# Patient Record
Sex: Female | Born: 1955 | Race: Black or African American | Hispanic: No | Marital: Single | Smoking: Former smoker
Health system: Southern US, Community
[De-identification: ages and names within clinical notes are randomized; demographics above are authoritative.]

## PROBLEM LIST (undated history)

## (undated) DIAGNOSIS — K219 Gastro-esophageal reflux disease without esophagitis: Secondary | ICD-10-CM

## (undated) DIAGNOSIS — R112 Nausea with vomiting, unspecified: Secondary | ICD-10-CM

## (undated) DIAGNOSIS — E119 Type 2 diabetes mellitus without complications: Secondary | ICD-10-CM

## (undated) DIAGNOSIS — Z9484 Stem cells transplant status: Secondary | ICD-10-CM

## (undated) DIAGNOSIS — Z9889 Other specified postprocedural states: Secondary | ICD-10-CM

## (undated) DIAGNOSIS — J40 Bronchitis, not specified as acute or chronic: Secondary | ICD-10-CM

## (undated) DIAGNOSIS — C8590 Non-Hodgkin lymphoma, unspecified, unspecified site: Secondary | ICD-10-CM

## (undated) DIAGNOSIS — M1990 Unspecified osteoarthritis, unspecified site: Secondary | ICD-10-CM

## (undated) DIAGNOSIS — Z8489 Family history of other specified conditions: Secondary | ICD-10-CM

## (undated) HISTORY — PX: EYE SURGERY: SHX253

---

## 1994-08-11 DIAGNOSIS — Z8572 Personal history of non-Hodgkin lymphomas: Secondary | ICD-10-CM | POA: Insufficient documentation

## 1994-08-11 HISTORY — PX: OTHER SURGICAL HISTORY: SHX169

## 1995-08-12 DIAGNOSIS — C8590 Non-Hodgkin lymphoma, unspecified, unspecified site: Secondary | ICD-10-CM

## 1995-08-12 DIAGNOSIS — Z9484 Stem cells transplant status: Secondary | ICD-10-CM

## 1995-08-12 DIAGNOSIS — Z9889 Other specified postprocedural states: Secondary | ICD-10-CM

## 1995-08-12 HISTORY — DX: Stem cells transplant status: Z94.84

## 1995-08-12 HISTORY — DX: Non-Hodgkin lymphoma, unspecified, unspecified site: C85.90

## 1995-08-12 HISTORY — DX: Other specified postprocedural states: Z98.890

## 2013-03-31 ENCOUNTER — Other Ambulatory Visit (HOSPITAL_COMMUNITY): Payer: Self-pay | Admitting: Orthopedic Surgery

## 2013-04-19 ENCOUNTER — Encounter (HOSPITAL_COMMUNITY): Payer: Self-pay | Admitting: Pharmacy Technician

## 2013-04-19 NOTE — Pre-Procedure Instructions (Signed)
Melisse Caetano  04/19/2013   Your procedure is scheduled on:  Wednesday, Sept. 17th   Report to [facility] at  6:30 AM.             Bonita Quin will come in through Entrance "A")   Call this number if you have problems the morning of surgery: [phone]   Remember:   Do not eat food or drink liquids after midnight Tuesday.   Take these medicines the morning of surgery with A SIP OF WATER: None   Do not wear jewelry, make-up or nail polish.  Do not wear lotions, powders, or perfumes. You may NOT wear deodorant.   Do not shave underarms & legs 48 hours prior to surgery.   Do not bring valuables to the hospital.  [facility] is not responsible for any belongings or valuables.  Contacts, dentures or bridgework may not be worn into surgery.   Leave suitcase in the car. After surgery it may be brought to your room.  For patients admitted to the hospital, checkout time is 11:00 AM the day of discharge.   Name and phone number of your driver:    Special Instructions: Shower using CHG 2 nights before surgery and the night before surgery.  If you shower the day of surgery use CHG.  Use special wash - you have one bottle of CHG for all showers.  You should use approximately 1/3 of the bottle for each shower.   Please read over the following fact sheets that you were given: Pain Booklet, Coughing and Deep Breathing and Surgical Site Infection Prevention

## 2013-04-20 ENCOUNTER — Other Ambulatory Visit (HOSPITAL_COMMUNITY): Payer: Self-pay | Admitting: *Deleted

## 2013-04-20 ENCOUNTER — Encounter (HOSPITAL_COMMUNITY): Payer: Self-pay

## 2013-04-20 ENCOUNTER — Encounter (HOSPITAL_COMMUNITY)
Admission: RE | Admit: 2013-04-20 | Discharge: 2013-04-20 | Disposition: A | Discharge status: Home / Self Care | Payer: BC Managed Care – PPO | Source: Ambulatory Visit | Attending: Orthopedic Surgery | Admitting: Orthopedic Surgery

## 2013-04-20 DIAGNOSIS — Z01812 Encounter for preprocedural laboratory examination: Secondary | ICD-10-CM | POA: Insufficient documentation

## 2013-04-20 DIAGNOSIS — Z01810 Encounter for preprocedural cardiovascular examination: Secondary | ICD-10-CM | POA: Insufficient documentation

## 2013-04-20 DIAGNOSIS — Z01818 Encounter for other preprocedural examination: Secondary | ICD-10-CM | POA: Insufficient documentation

## 2013-04-20 HISTORY — DX: Bronchitis, not specified as acute or chronic: J40

## 2013-04-20 HISTORY — DX: Type 2 diabetes mellitus without complications: E11.9

## 2013-04-20 HISTORY — DX: Stem cells transplant status: Z94.84

## 2013-04-20 HISTORY — DX: Unspecified osteoarthritis, unspecified site: M19.90

## 2013-04-20 HISTORY — DX: Non-Hodgkin lymphoma, unspecified, unspecified site: C85.90

## 2013-04-20 HISTORY — DX: Gastro-esophageal reflux disease without esophagitis: K21.9

## 2013-04-20 HISTORY — DX: Other specified postprocedural states: Z98.890

## 2013-04-20 LAB — COMPREHENSIVE METABOLIC PANEL
ALT: 9 U/L (ref 0–35)
AST: 17 U/L (ref 0–37)
CO2: 28 mEq/L (ref 19–32)
Calcium: 9.6 mg/dL (ref 8.4–10.5)
Creatinine, Ser: 0.6 mg/dL (ref 0.50–1.10)
GFR calc Af Amer: >90 mL/min (ref >90)
GFR calc non Af Amer: >90 mL/min (ref >90)
Sodium: 139 mEq/L (ref 135–145)
Total Protein: 7 g/dL (ref 6.0–8.3)

## 2013-04-20 LAB — CBC
MCH: 31.6 pg (ref 26.0–34.0)
MCHC: 33.9 g/dL (ref 30.0–36.0)
MCV: 93.5 fL (ref 78.0–100.0)
Platelets: 261 10*3/uL (ref 150–400)
RBC: 4.14 MIL/uL (ref 3.87–5.11)
RDW: 14.2 % (ref 11.5–15.5)

## 2013-04-20 LAB — PROTIME-INR: INR: 0.93 (ref 0.00–1.49)

## 2013-04-21 NOTE — Progress Notes (Signed)
Anesthesia Chart Review:  Patient is a 57 year old female scheduled for left subtalar fusion and talonavicular fusion on 04/27/13 by Dr. Lajoyce Corners.  History includes smoking, DM on insulin, non-Hodgkin's lymphoma s/p stem cell transplant '97, GERD.  EKG on 04/20/13 showed NSR.  CXR report on 04/20/13 showed: 1. No acute pulmonary process.  2. Abnormal right mediastinal contours/double density. The appearance most resembles a dilated thoracic esophagus such as due to achalasia, but this is not not confirmed (no esophageal fluid level). Query history of achalasia or dysphagia. Other top consideration is highly tortuous thoracic aorta. Less likely other nonspecific mediastinal soft tissue. Chest CT (IV contrast preferred) would evaluate further.   These results will be called to the ordering clinician [Dr. Duda] or representative by the Radiologist Assistant, and communication documented in the PACS Dashboard (Dr. Augusto Gamble).   Preoperative labs noted.  I reviewed CXR report with anesthesiologist Dr. Krista Blue who felt it was okay to defer timing of further evaluation of possible achalasia or highly tortuous thoracic aorta to surgeon.  Velna Ochs [facility]/Anesthesiology Phone [phone] 04/21/2013 1:26 PM

## 2013-04-26 MED ORDER — CEFAZOLIN SODIUM-DEXTROSE 2-3 GM-% IV SOLR
2.0000 g | INTRAVENOUS | Status: AC
  Administered 2013-04-27: 2 g via INTRAVENOUS
  Filled 2013-04-26: qty 50

## 2013-04-27 ENCOUNTER — Encounter (HOSPITAL_COMMUNITY): Payer: Self-pay

## 2013-04-27 ENCOUNTER — Encounter (HOSPITAL_COMMUNITY)
Admission: RE | Disposition: A | Discharge status: Home / Self Care | Payer: Self-pay | Source: Ambulatory Visit | Attending: Orthopedic Surgery

## 2013-04-27 ENCOUNTER — Ambulatory Visit (HOSPITAL_COMMUNITY): Payer: BC Managed Care – PPO | Admitting: Anesthesiology

## 2013-04-27 ENCOUNTER — Observation Stay (HOSPITAL_COMMUNITY)
Admission: RE | Admit: 2013-04-27 | Discharge: 2013-04-29 | Disposition: A | Discharge status: Home / Self Care | Payer: BC Managed Care – PPO | Source: Ambulatory Visit | Attending: Orthopedic Surgery | Admitting: Orthopedic Surgery

## 2013-04-27 ENCOUNTER — Encounter (HOSPITAL_COMMUNITY): Payer: Self-pay | Admitting: Vascular Surgery

## 2013-04-27 DIAGNOSIS — Z794 Long term (current) use of insulin: Secondary | ICD-10-CM | POA: Insufficient documentation

## 2013-04-27 DIAGNOSIS — Z79899 Other long term (current) drug therapy: Secondary | ICD-10-CM | POA: Insufficient documentation

## 2013-04-27 DIAGNOSIS — M76829 Posterior tibial tendinitis, unspecified leg: Secondary | ICD-10-CM

## 2013-04-27 DIAGNOSIS — M19079 Primary osteoarthritis, unspecified ankle and foot: Principal | ICD-10-CM | POA: Insufficient documentation

## 2013-04-27 DIAGNOSIS — C8589 Other specified types of non-Hodgkin lymphoma, extranodal and solid organ sites: Secondary | ICD-10-CM | POA: Insufficient documentation

## 2013-04-27 DIAGNOSIS — Z23 Encounter for immunization: Secondary | ICD-10-CM | POA: Insufficient documentation

## 2013-04-27 DIAGNOSIS — M216X9 Other acquired deformities of unspecified foot: Secondary | ICD-10-CM | POA: Insufficient documentation

## 2013-04-27 DIAGNOSIS — E119 Type 2 diabetes mellitus without complications: Secondary | ICD-10-CM | POA: Insufficient documentation

## 2013-04-27 HISTORY — PX: ANKLE FUSION: SHX5718

## 2013-04-27 LAB — GLUCOSE, CAPILLARY
Glucose-Capillary: 94 mg/dL (ref 70–99)
Glucose-Capillary: 108 mg/dL — ABNORMAL HIGH (ref 70–99)

## 2013-04-27 SURGERY — ANKLE FUSION
Anesthesia: General | Site: Foot | Laterality: Left | Wound class: Clean

## 2013-04-27 MED ORDER — HYDROMORPHONE HCL PF 1 MG/ML IJ SOLN
INTRAMUSCULAR | Status: AC
  Administered 2013-04-27: 0.5 mg
  Filled 2013-04-27: qty 1

## 2013-04-27 MED ORDER — LACTATED RINGERS IV SOLN
INTRAVENOUS | Status: DC | PRN
  Administered 2013-04-27 (×2): via INTRAVENOUS

## 2013-04-27 MED ORDER — PHENYLEPHRINE HCL 10 MG/ML IJ SOLN
INTRAMUSCULAR | Status: DC | PRN
  Administered 2013-04-27 (×2): 80 ug via INTRAVENOUS

## 2013-04-27 MED ORDER — INSULIN ASPART 100 UNIT/ML ~~LOC~~ SOLN
0.0000 [IU] | Freq: Three times a day (TID) | SUBCUTANEOUS | Status: DC
Start: 2013-04-27 — End: 2013-04-29
  Administered 2013-04-28: 2 [IU] via SUBCUTANEOUS

## 2013-04-27 MED ORDER — INSULIN GLARGINE 100 UNIT/ML ~~LOC~~ SOLN
20.0000 [IU] | Freq: Every day | SUBCUTANEOUS | Status: DC
  Administered 2013-04-27 – 2013-04-28 (×2): 20 [IU] via SUBCUTANEOUS
  Filled 2013-04-27 (×3): qty 0.2

## 2013-04-27 MED ORDER — INSULIN ASPART 100 UNIT/ML ~~LOC~~ SOLN
4.0000 [IU] | Freq: Three times a day (TID) | SUBCUTANEOUS | Status: DC
  Administered 2013-04-28: 18:00:00 via SUBCUTANEOUS
  Administered 2013-04-28 – 2013-04-29 (×2): 4 [IU] via SUBCUTANEOUS

## 2013-04-27 MED ORDER — MORPHINE SULFATE 2 MG/ML IJ SOLN
1.0000 mg | INTRAMUSCULAR | Status: DC | PRN
  Administered 2013-04-27 – 2013-04-28 (×3): 1 mg via INTRAVENOUS
  Filled 2013-04-27 (×3): qty 1

## 2013-04-27 MED ORDER — OXYCODONE-ACETAMINOPHEN 5-325 MG PO TABS
1.0000 | ORAL_TABLET | ORAL | Status: DC | PRN
  Administered 2013-04-28 (×2): 2 via ORAL
  Filled 2013-04-27 (×2): qty 2

## 2013-04-27 MED ORDER — 0.9 % SODIUM CHLORIDE (POUR BTL) OPTIME
TOPICAL | Status: DC | PRN
  Administered 2013-04-27: 1000 mL

## 2013-04-27 MED ORDER — ATORVASTATIN CALCIUM 10 MG PO TABS
10.0000 mg | ORAL_TABLET | Freq: Every day | ORAL | Status: DC
  Administered 2013-04-27 – 2013-04-28 (×2): 10 mg via ORAL
  Filled 2013-04-27 (×3): qty 1

## 2013-04-27 MED ORDER — PROPOFOL 10 MG/ML IV BOLUS
INTRAVENOUS | Status: DC | PRN
  Administered 2013-04-27: 200 mg via INTRAVENOUS

## 2013-04-27 MED ORDER — MIDAZOLAM HCL 5 MG/5ML IJ SOLN
INTRAMUSCULAR | Status: DC | PRN
  Administered 2013-04-27: 2 mg via INTRAVENOUS

## 2013-04-27 MED ORDER — CEFAZOLIN SODIUM 1-5 GM-% IV SOLN
1.0000 g | Freq: Four times a day (QID) | INTRAVENOUS | Status: AC
  Administered 2013-04-27 – 2013-04-28 (×3): 1 g via INTRAVENOUS
  Filled 2013-04-27 (×3): qty 50

## 2013-04-27 MED ORDER — NEOSTIGMINE METHYLSULFATE 1 MG/ML IJ SOLN
INTRAMUSCULAR | Status: DC | PRN
  Administered 2013-04-27: 3 mg via INTRAVENOUS

## 2013-04-27 MED ORDER — ARTIFICIAL TEARS OP OINT
TOPICAL_OINTMENT | OPHTHALMIC | Status: DC | PRN
  Administered 2013-04-27: 1 via OPHTHALMIC

## 2013-04-27 MED ORDER — LIDOCAINE HCL (CARDIAC) 20 MG/ML IV SOLN
INTRAVENOUS | Status: DC | PRN
  Administered 2013-04-27: 50 mg via INTRAVENOUS

## 2013-04-27 MED ORDER — PROMETHAZINE HCL 25 MG/ML IJ SOLN
12.5000 mg | Freq: Four times a day (QID) | INTRAMUSCULAR | Status: DC | PRN
  Administered 2013-04-27: 12.5 mg via INTRAVENOUS
  Filled 2013-04-27: qty 1

## 2013-04-27 MED ORDER — ONDANSETRON HCL 4 MG/2ML IJ SOLN
4.0000 mg | Freq: Four times a day (QID) | INTRAMUSCULAR | Status: DC | PRN
  Administered 2013-04-27: 4 mg via INTRAVENOUS
  Filled 2013-04-27: qty 2

## 2013-04-27 MED ORDER — ROCURONIUM BROMIDE 100 MG/10ML IV SOLN
INTRAVENOUS | Status: DC | PRN
  Administered 2013-04-27: 40 mg via INTRAVENOUS

## 2013-04-27 MED ORDER — HYDROMORPHONE HCL PF 1 MG/ML IJ SOLN
0.5000 mg | INTRAMUSCULAR | Status: DC | PRN
  Administered 2013-04-27: 1 mg via INTRAVENOUS
  Filled 2013-04-27 (×2): qty 1

## 2013-04-27 MED ORDER — FENTANYL CITRATE 0.05 MG/ML IJ SOLN
INTRAMUSCULAR | Status: AC
  Administered 2013-04-27: 11:00:00
  Filled 2013-04-27: qty 2

## 2013-04-27 MED ORDER — ASPIRIN EC 325 MG PO TBEC
325.0000 mg | DELAYED_RELEASE_TABLET | Freq: Every day | ORAL | Status: DC
  Administered 2013-04-27 – 2013-04-29 (×3): 325 mg via ORAL
  Filled 2013-04-27 (×3): qty 1

## 2013-04-27 MED ORDER — GLYCOPYRROLATE 0.2 MG/ML IJ SOLN
INTRAMUSCULAR | Status: DC | PRN
  Administered 2013-04-27: .6 mg via INTRAVENOUS

## 2013-04-27 MED ORDER — METOCLOPRAMIDE HCL 5 MG/ML IJ SOLN
5.0000 mg | Freq: Three times a day (TID) | INTRAMUSCULAR | Status: DC | PRN
  Administered 2013-04-27: 10 mg via INTRAVENOUS
  Filled 2013-04-27: qty 2

## 2013-04-27 MED ORDER — FENTANYL CITRATE 0.05 MG/ML IJ SOLN
INTRAMUSCULAR | Status: DC | PRN
  Administered 2013-04-27: 100 ug via INTRAVENOUS
  Administered 2013-04-27: 50 ug via INTRAVENOUS

## 2013-04-27 MED ORDER — SODIUM CHLORIDE 0.9 % IV SOLN: INTRAVENOUS | Status: DC

## 2013-04-27 MED ORDER — HYDROCODONE-ACETAMINOPHEN 5-325 MG PO TABS
1.0000 | ORAL_TABLET | ORAL | Status: DC | PRN
  Administered 2013-04-28: 1 via ORAL
  Administered 2013-04-28: 2 via ORAL
  Administered 2013-04-28: 1 via ORAL
  Administered 2013-04-28 – 2013-04-29 (×4): 2 via ORAL
  Filled 2013-04-27 (×3): qty 2
  Filled 2013-04-27: qty 1
  Filled 2013-04-27 (×2): qty 2
  Filled 2013-04-27: qty 1

## 2013-04-27 MED ORDER — METOCLOPRAMIDE HCL 5 MG PO TABS
5.0000 mg | ORAL_TABLET | Freq: Three times a day (TID) | ORAL | Status: DC | PRN
  Filled 2013-04-27: qty 2

## 2013-04-27 MED ORDER — FENTANYL CITRATE 0.05 MG/ML IJ SOLN
25.0000 ug | INTRAMUSCULAR | Status: DC | PRN
  Administered 2013-04-27 (×2): 25 ug via INTRAVENOUS
  Administered 2013-04-27: 50 ug via INTRAVENOUS

## 2013-04-27 MED ORDER — ONDANSETRON HCL 4 MG/2ML IJ SOLN
INTRAMUSCULAR | Status: DC | PRN
  Administered 2013-04-27: 4 mg via INTRAVENOUS

## 2013-04-27 MED ORDER — ONDANSETRON HCL 4 MG PO TABS: 4.0000 mg | ORAL_TABLET | Freq: Four times a day (QID) | ORAL | Status: DC | PRN

## 2013-04-27 SURGICAL SUPPLY — 55 items
BANDAGE ESMARK 6X9 LF (GAUZE/BANDAGES/DRESSINGS) ×1 IMPLANT
BANDAGE GAUZE ELAST BULKY 4 IN (GAUZE/BANDAGES/DRESSINGS) ×2 IMPLANT
BIT DRILL 5 ACE CANN QC (BIT) ×2 IMPLANT
BIT DRILL 70X3.5XCANN (BIT) ×1 IMPLANT
BIT DRILL CANN 3.5 (BIT) ×1
BIT DRL 70X3.5XCANN (BIT) ×1
BLADE SAW SGTL HD 18.5X60.5X1. (BLADE) ×2 IMPLANT
BLADE SURG 10 STRL SS (BLADE) ×4 IMPLANT
BNDG COHESIVE 4X5 TAN STRL (GAUZE/BANDAGES/DRESSINGS) ×2 IMPLANT
BNDG ESMARK 6X9 LF (GAUZE/BANDAGES/DRESSINGS) ×2
CLOTH BEACON ORANGE TIMEOUT ST (SAFETY) ×2 IMPLANT
COVER MAYO STAND STRL (DRAPES) IMPLANT
COVER SURGICAL LIGHT HANDLE (MISCELLANEOUS) ×2 IMPLANT
DRAPE INCISE IOBAN 66X45 STRL (DRAPES) ×2 IMPLANT
DRAPE OEC MINIVIEW 54X84 (DRAPES) ×2 IMPLANT
DRAPE U-SHAPE 47X51 STRL (DRAPES) ×2 IMPLANT
DRSG ADAPTIC 3X8 NADH LF (GAUZE/BANDAGES/DRESSINGS) ×2 IMPLANT
DRSG PAD ABDOMINAL 8X10 ST (GAUZE/BANDAGES/DRESSINGS) ×2 IMPLANT
DURAPREP 26ML APPLICATOR (WOUND CARE) ×2 IMPLANT
ELECT REM PT RETURN 9FT ADLT (ELECTROSURGICAL) ×2
ELECTRODE REM PT RTRN 9FT ADLT (ELECTROSURGICAL) ×1 IMPLANT
GLOVE BIOGEL PI IND STRL 7.5 (GLOVE) ×1 IMPLANT
GLOVE BIOGEL PI IND STRL 9 (GLOVE) ×1 IMPLANT
GLOVE BIOGEL PI INDICATOR 7.5 (GLOVE) ×1
GLOVE BIOGEL PI INDICATOR 9 (GLOVE) ×1
GLOVE BIOGEL PI ORTHO PRO SZ7 (GLOVE) ×1
GLOVE PI ORTHO PRO STRL SZ7 (GLOVE) ×1 IMPLANT
GLOVE SURG ORTHO 9.0 STRL STRW (GLOVE) ×2 IMPLANT
GLOVE SURG SS PI 6.5 STRL IVOR (GLOVE) ×2 IMPLANT
GLOVE SURG SS PI 7.0 STRL IVOR (GLOVE) ×2 IMPLANT
GOWN PREVENTION PLUS XLARGE (GOWN DISPOSABLE) IMPLANT
GOWN SRG XL XLNG 56XLVL 4 (GOWN DISPOSABLE) ×3 IMPLANT
GOWN STRL NON-REIN XL XLG LVL4 (GOWN DISPOSABLE) ×3
K-WIRE 1.6MM THD TROCAR TIP NS (WIRE) ×4
KIT BASIN OR (CUSTOM PROCEDURE TRAY) ×2 IMPLANT
KIT ROOM TURNOVER OR (KITS) ×2 IMPLANT
KWIRE 1.6MM THD TROCAR TIP NS (WIRE) ×2 IMPLANT
MANIFOLD NEPTUNE II (INSTRUMENTS) ×2 IMPLANT
NS IRRIG 1000ML POUR BTL (IV SOLUTION) ×2 IMPLANT
PACK ORTHO EXTREMITY (CUSTOM PROCEDURE TRAY) ×2 IMPLANT
PAD ARMBOARD 7.5X6 YLW CONV (MISCELLANEOUS) ×2 IMPLANT
PAD CAST 4YDX4 CTTN HI CHSV (CAST SUPPLIES) IMPLANT
PADDING CAST COTTON 4X4 STRL (CAST SUPPLIES)
PIN THREADED GUIDE ACE (PIN) ×2 IMPLANT
PUTTY DBM STAGRAFT 5CC (Putty) ×2 IMPLANT
SCREW CANN 6.5 60MM (Screw) ×2 IMPLANT
SCREW CANN PART THRD 4.0X55 (Screw) ×4 IMPLANT
SPONGE GAUZE 4X4 12PLY (GAUZE/BANDAGES/DRESSINGS) ×2 IMPLANT
SPONGE LAP 18X18 X RAY DECT (DISPOSABLE) ×2 IMPLANT
SUCTION FRAZIER TIP 10 FR DISP (SUCTIONS) ×2 IMPLANT
SUT ETHILON 2 0 PSLX (SUTURE) ×4 IMPLANT
TOWEL OR 17X24 6PK STRL BLUE (TOWEL DISPOSABLE) ×2 IMPLANT
TOWEL OR 17X26 10 PK STRL BLUE (TOWEL DISPOSABLE) ×2 IMPLANT
TUBE CONNECTING 12X1/4 (SUCTIONS) ×2 IMPLANT
WATER STERILE IRR 1000ML POUR (IV SOLUTION) IMPLANT

## 2013-04-27 NOTE — Op Note (Signed)
OPERATIVE REPORT  DATE OF SURGERY: 04/27/2013  PATIENT:  Melinda Drake,  57 y.o. female  PRE-OPERATIVE DIAGNOSIS:  PTT Collapse Left Foot  POST-OPERATIVE DIAGNOSIS:  PTT Collapse Left Foot  PROCEDURE:  Procedure(s): Triple arthrodesis left foot. Fusion subtalar joint. Fusion talonavicular joint.  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 She has failed conservative therapy she is fixed valgus and pronation of the forefoot secondary to collapse through the talonavicular joint and subtalar joint. Due to failure conservative care patient presents at this time for triple arthrodesis. Risks and benefits were discussed including infection neurovascular injury persistent pain DVT need for additional surgery. Patient states she understands and wished to proceed at this time. Description of procedure patient brought to the operating room and underwent a general anesthetic. After adequate levels and anesthesia were obtained patient's left lower extremity was prepped using DuraPrep and draped into a sterile field. An incision was made over the sinus Tarsi in line with the wrinkling of the skin. An Esmarch was placed at the ankle for tourniquet control. The extensor muscles were elevated off the sinus Tarsi osteotomes curettes Ballinger were used to debris the subtalar joint of the articular cartilage this was debrided back to bleeding viable subchondral bone. Attention was then focused on the talonavicular joint. Incision was made dorsally over the talonavicular joint and the talonavicular joint was also debrided of the articular cartilage. The foot was then reduced with slight valgus and the pronation was reduced to a neutral alignment. 2 K wires were inserted across the  talonavicular joint with the foot plantar grade and a K wire was inserted from the calcaneus into the talus across the subtalar joint. C-arm flaps be verified reduction. A 6.5 cannulated screw was then used to stabilize the subtalar joint and 2 cannulated 4.0 screws were used to stabilize the talonavicular joint. C-arm fluoroscopy verified reduction. The wounds were irrigated with normal saline. Demineralized bone matrix with allograft was used in both the fusion sites 5 cc Biomet product. The subcutaneous is closed using 2-0 Vicryl the skin was closed using 2-0 nylon. The wounds were covered with Adaptic orthopedic sponges ABDs dressing Kerlix and Coban. The tourniquet was deflated at 50 minutes and hemostasis was obtained. Patient was extubated taken the PACU in stable condition.  PLAN OF CARE: Admit for overnight observation  PATIENT DISPOSITION:  PACU - hemodynamically stable.   Nadara Mustard, MD 04/27/2013 9:49 AM

## 2013-04-27 NOTE — Anesthesia Preprocedure Evaluation (Addendum)
Anesthesia Evaluation  Patient identified by MRN, date of birth, ID band Patient awake    Reviewed: Allergy & Precautions, H&P , NPO status , Patient's Chart, lab work & pertinent test results  Airway Mallampati: II  Neck ROM: full    Dental  (+) Teeth Intact and Dental Advidsory Given   Pulmonary neg pulmonary ROS,          Cardiovascular negative cardio ROS  Rhythm:Regular Rate:Normal     Neuro/Psych    GI/Hepatic GERD-  Poorly Controlled,  Endo/Other  diabetes, Well Controlled, Type 1, Insulin Dependent  Renal/GU      Musculoskeletal   Abdominal   Peds  Hematology  (+) Blood dyscrasia, anemia ,   Anesthesia Other Findings   Reproductive/Obstetrics                          Anesthesia Physical Anesthesia Plan  ASA: III  Anesthesia Plan: General   Post-op Pain Management:    Induction: Intravenous  Airway Management Planned: Oral ETT  Additional Equipment:   Intra-op Plan:   Post-operative Plan: Extubation in OR  Informed Consent: I have reviewed the patients History and Physical, chart, labs and discussed the procedure including the risks, benefits and alternatives for the proposed anesthesia with the patient or authorized representative who has indicated his/her understanding and acceptance.   Dental Advisory Given and Dental advisory given  Plan Discussed with: Anesthesiologist, CRNA and Surgeon  Anesthesia Plan Comments:       Anesthesia Quick Evaluation

## 2013-04-27 NOTE — Progress Notes (Signed)
UR COMPLETED  

## 2013-04-27 NOTE — Progress Notes (Signed)
Dr Lajoyce Corners called at office for continued n/v in spite of Zofran and Reglan.?allergy to Dilaudid. Morphine and Phenergan ordered. Continue to monitor patient.

## 2013-04-27 NOTE — H&P (Signed)
Melinda Drake is an 57 y.o. female.   Chief Complaint: Posterior tibial tendon insufficiency with pronated valgus fixed hindfoot deformity. 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 She has failed prolonged conservative therapy has pain with activities of daily living.  Past Medical History  Diagnosis Date  . Diabetes mellitus without complication   . Non Hodgkin's lymphoma 1997  . Bronchitis   . GERD (gastroesophageal reflux disease)   . Arthritis   . H/O stem cell transplant 1997  . History of biopsy 1997    left side neck    Past Surgical History  Procedure Laterality Date  . Eye surgery  ? early 2000's    bilateral cataracts    Family History  Problem Relation Age of Onset  . Diabetes Mother   . Coronary artery disease Mother   . Hypertension Mother   . Thyroid nodules Sister   . Hypertension Sister    Social History:  reports that she has been smoking Cigarettes.  She has been smoking about 0.25 packs per day. She has never used smokeless tobacco. She reports that  drinks alcohol. She reports that she does not use illicit drugs.  Allergies: No Known Allergies  No prescriptions prior to admission    No results found for this or any previous visit (from the past 48 hour(s)). No results found.  Review of Systems  All other systems reviewed and are negative.    There were no vitals taken for this visit. Physical Exam  Examination patient has a palpable pulse. She has a fixed valgus deformity of the hindfoot pronation of the forefoot. Radiographs shows collapse through the subtalar joint and talonavicular joint. The calcaneocuboid joint is preserved. Assessment/Plan Assessment: Pronated valgus collapse of the hindfoot with degenerative collapse through the subtalar joint and talonavicular joint.  Plan: Will plan for triple arthrodesis with fusion through the  subtalar joint as well as the fusion through the talonavicular joint. Risks and benefits were discussed including infection neurovascular injury pain DVT need for additional surgery. Patient states she understands and wished to proceed at this time.  Shephanie Romas V 04/27/2013, 6:37 AM

## 2013-04-27 NOTE — Anesthesia Postprocedure Evaluation (Signed)
  Anesthesia Post-op Note  Patient: Melinda Drake  Procedure(s) Performed: Procedure(s) with comments: ANKLE FUSION- left (Left) - Left Subtalar Fusion and Talonavicular Fusion  Patient Location: PACU  Anesthesia Type:General  Level of Consciousness: awake  Airway and Oxygen Therapy: Patient Spontanous Breathing  Post-op Pain: mild  Post-op Assessment: Post-op Vital signs reviewed  Post-op Vital Signs: Reviewed  Complications: No apparent anesthesia complications

## 2013-04-27 NOTE — Progress Notes (Signed)
Orthopedic Tech Progress Note Patient Details:  Melinda Drake 03/14/1956 [id number]  Ortho Devices Type of Ortho Device: CAM walker Ortho Device/Splint Interventions: Application   Shawnie Pons 04/27/2013, 10:09 AM

## 2013-04-27 NOTE — Anesthesia Procedure Notes (Signed)
Procedure Name: Intubation Date/Time: 04/27/2013 8:38 AM Performed by: Carmela Rima Pre-anesthesia Checklist: Patient identified, Timeout performed, Emergency Drugs available, Suction available and Patient being monitored Patient Re-evaluated:Patient Re-evaluated prior to inductionOxygen Delivery Method: Circle system utilized Preoxygenation: Pre-oxygenation with 100% oxygen Intubation Type: IV induction Ventilation: Mask ventilation without difficulty Laryngoscope Size: Mac and 3 Grade View: Grade II Tube type: Oral Tube size: 8.0 mm Number of attempts: 1 Placement Confirmation: ETT inserted through vocal cords under direct vision,  positive ETCO2 and breath sounds checked- equal and bilateral Secured at: 22 cm Tube secured with: Tape Dental Injury: Teeth and Oropharynx as per pre-operative assessment

## 2013-04-27 NOTE — Transfer of Care (Signed)
Immediate Anesthesia Transfer of Care Note  Patient: Melinda Drake  Procedure(s) Performed: Procedure(s) with comments: ANKLE FUSION- left (Left) - Left Subtalar Fusion and Talonavicular Fusion  Patient Location: PACU  Anesthesia Type:General  Level of Consciousness: sedated  Airway & Oxygen Therapy: Patient Spontanous Breathing and Patient connected to face mask oxygen  Post-op Assessment: Report given to PACU RN, Post -op Vital signs reviewed and stable and Patient moving all extremities X 4  Post vital signs: Reviewed and stable  Complications: No apparent anesthesia complications

## 2013-04-27 NOTE — Preoperative (Signed)
Beta Blockers   Reason not to administer Beta Blockers:Not Applicable 

## 2013-04-28 ENCOUNTER — Encounter (HOSPITAL_COMMUNITY): Payer: Self-pay | Admitting: *Deleted

## 2013-04-28 LAB — GLUCOSE, CAPILLARY: Glucose-Capillary: 131 mg/dL — ABNORMAL HIGH (ref 70–99)

## 2013-04-28 MED ORDER — HYDROCODONE-ACETAMINOPHEN 5-325 MG PO TABS: 1.0000 | ORAL_TABLET | Freq: Four times a day (QID) | ORAL | Status: DC | PRN

## 2013-04-28 NOTE — Progress Notes (Signed)
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  She underwent triple arthrodesis and is now TDWB L LE.   PT Comments   Pt. With improving ability to maintain TDWB for short distances.  She will be staying over tonight and will benefit from one session in am prior to DC  Home with sister.  Follow Up Recommendations  Home health PT;Supervision/Assistance - 24 hour     Does the patient have the potential to tolerate intense rehabilitation     Barriers to Discharge Decreased caregiver support (sister cannot physically help pt. but is available 24 hours)      Equipment Recommendations  Rolling walker with 5" wheels;Wheelchair (measurements PT);Wheelchair cushion (measurements PT);3in1 (PT)    Recommendations for Other Services    Frequency Min 6X/week   Progress towards PT Goals Progress towards PT goals: Progressing toward goals  Plan Current plan remains appropriate    Precautions / Restrictions Precautions Required Braces or Orthoses: Other Brace/Splint Other Brace/Splint: cam boot L foot/ankle Restrictions Weight Bearing Restrictions: Yes LLE Weight Bearing: Touchdown weight bearing   Pertinent Vitals/Pain See vitals tab     Mobility  Bed Mobility Bed Mobility: Supine to Sit;Sitting - Scoot to Edge of Bed Supine to Sit: 5: Supervision;HOB flat Sitting - Scoot to Edge of Bed: 5: Supervision Sit to Supine: 4: Min guard Details for Bed Mobility Assistance: pt. managing L LE on her own with sheet to assist in supine to sit, needed min gaurd for safety in sit to supine without sheet assist. Transfers Transfers: Sit to Stand;Stand to Sit Sit to Stand: 4: Min guard;With upper extremity assist;From bed Stand to Sit: 4:  Min guard;With upper extremity assist;To bed Details for Transfer Assistance: min guard assist for safety and balance Ambulation/Gait Ambulation/Gait Assistance: 4: Min assist Ambulation Distance (Feet): 50 Feet Assistive device: Rolling walker Ambulation/Gait Assistance Details: frequent cues to assure TDWB and for sequencing, min assist for stability and safety Gait Pattern: Step-to pattern Gait velocity: decreased    Exercises     PT Diagnosis: Difficulty walking;Abnormality of gait;Acute pain  PT Problem List: Decreased activity tolerance;Decreased balance;Decreased mobility;Decreased knowledge of use of DME;Decreased knowledge of precautions;Pain PT Treatment Interventions: DME instruction;Gait training;Functional mobility training;Therapeutic activities;Balance training;Patient/family education   PT Goals (current goals can now be found in the care plan section) Acute Rehab PT Goals Patient Stated Goal: home and resume independent function PT Goal Formulation: With patient Time For Goal Achievement: 05/05/13 Potential to Achieve Goals: Fair  Visit Information  Last PT Received On: 04/28/13 Assistance Needed: +1 History of Present Illness: Pt. with subtalar arthritis and posterior tibial tendon insufficiency.  She underwent triple arthrodesis and is now TDWB L LE.    Subjective Data  Subjective: I'm starting to feel better about things after this second practice Patient Stated Goal: home and resume independent function   Cognition  Cognition Arousal/Alertness: Awake/alert Behavior During Therapy: WFL for tasks assessed/performed Overall Cognitive Status: Within Functional Limits for tasks assessed       End of Session PT - End of Session Equipment Utilized During Treatment: Gait belt;Other (comment) (cam boot) Activity Tolerance: Patient tolerated treatment well;Patient limited by fatigue Patient left: in bed;with call bell/phone within reach Nurse Communication:  Mobility status   GP Functional Assessment Tool Used: clinical judgement  Functional Limitation: Mobility: Walking and moving around Mobility: Walking and Moving Around Current Status [phone]): At least 40 percent but less than 60 percent impaired, limited or restricted Mobility: Walking and Moving Around Goal Status [phone]): At least 20 percent but less than 40 percent impaired, limited or restricted   Ferman Hamming 04/28/2013, 2:57 PM Weldon Picking PT Acute Rehab Services [phone] Beeper [phone]

## 2013-04-28 NOTE — Discharge Summary (Signed)
  Discharge to home in stable condition. 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 Prescription for Vicodin for pain. Minimize weightbearing left foot. Followup in the office in one week.

## 2013-04-28 NOTE — Evaluation (Signed)
Physical Therapy Evaluation Patient Details Name: Melinda Drake MRN: [id number] DOB: [birth year] Today's Date: 04/28/2013 Time: 7829-5621 PT Time Calculation (min): 45 min  PT Assessment / Plan / Recommendation History of Present Illness  Pt. with subtalar arthritis and posterior tibial tendon insufficiency.  She underwent triple arthrodesis and is now TDWB L LE.  Clinical Impression  Pt. Presents with decrease in functional mobility and gait and needs acute PT to address these and below issues.  SHe is having difficulty maintaining TDWB and did have an episode of feeling hot with sweats and could not continue on.  Recommend HHPT and W/C with elevating leg rests, RW and 3n1.  For transition for home.    PT Assessment  Patient needs continued PT services    Follow Up Recommendations  Home health PT;Supervision/Assistance - 24 hour    Does the patient have the potential to tolerate intense rehabilitation      Barriers to Discharge Decreased caregiver support (sister cannot physically help pt. but is available 24 hours)      Equipment Recommendations  Rolling walker with 5" wheels;Wheelchair (measurements PT);Wheelchair cushion (measurements PT);3in1 (PT)    Recommendations for Other Services     Frequency Min 6X/week    Precautions / Restrictions Precautions Required Braces or Orthoses: Other Brace/Splint Other Brace/Splint: cam boot L foot/ankle Restrictions Weight Bearing Restrictions: Yes LLE Weight Bearing: Touchdown weight bearing   Pertinent Vitals/Pain See vitals tab; pt. With episode of sweats and feeling hot and could not continue on with walking.  RN Claris Che was made aware.       Mobility  Bed Mobility Bed Mobility: Supine to Sit;Sitting - Scoot to Edge of Bed Supine to Sit: 4: Min assist;HOB flat Sitting - Scoot to [place] of Bed: 4: Min guard Details for Bed Mobility Assistance: min assist needed to manage L LE for transitional movements Transfers Transfers:  Sit to Stand;Stand to Sit Sit to Stand: 4: Min assist;With upper extremity assist;From bed;From chair/3-in-1;With armrests Stand to Sit: 4: Min assist;With upper extremity assist;With armrests;To chair/3-in-1 Details for Transfer Assistance: min assist for safety and stability Ambulation/Gait Ambulation/Gait Assistance: 4: Min assist Ambulation Distance (Feet): 25 Feet Assistive device: Rolling walker Ambulation/Gait Assistance Details: Pt. had difficutly maintaining TDWB status and with stepping through with L LE.  Needed min assist at times to bring leg through.  Pt. also needed min assistf balancing in RW.  Cues for TDWB frequqently needed.  Pt. needed increased time and multiple rest breaks (standing) to travel short distance  Gait Pattern: Step-to pattern Gait velocity: greatly decreased    Exercises     PT Diagnosis: Difficulty walking;Abnormality of gait;Acute pain  PT Problem List: Decreased activity tolerance;Decreased balance;Decreased mobility;Decreased knowledge of use of DME;Decreased knowledge of precautions;Pain PT Treatment Interventions: DME instruction;Gait training;Functional mobility training;Therapeutic activities;Balance training;Patient/family education     PT Goals(Current goals can be found in the care plan section) Acute Rehab PT Goals Patient Stated Goal: home and resume independent function PT Goal Formulation: With patient Time For Goal Achievement: 05/05/13 Potential to Achieve Goals: Fair  Visit Information  Last PT Received On: 04/28/13 Assistance Needed: +1 History of Present Illness: Pt. with subtalar arthritis and posterior tibial tendon insufficiency.  She underwent triple arthrodesis and is now TDWB L LE.       Prior Functioning  Home Living Family/patient expects to be discharged to:: Private residence Living Arrangements: Other relatives Available Help at Discharge: Available 24 hours/day;Family (sister) Type of Home: Apartment Home  Access: Ramped entrance (  sister believes there is a ramped entrance from parking lot) Home Layout: One level Home Equipment: Other (comment) (knee walker) Prior Function Level of Independence: Independent Communication Communication: No difficulties    Cognition  Cognition Arousal/Alertness: Awake/alert Behavior During Therapy: WFL for tasks assessed/performed Overall Cognitive Status: Within Functional Limits for tasks assessed    Extremity/Trunk Assessment Upper Extremity Assessment Upper Extremity Assessment: Overall WFL for tasks assessed Lower Extremity Assessment Lower Extremity Assessment: LLE deficits/detail LLE: Unable to fully assess due to pain;Unable to fully assess due to immobilization (in cam boot) Cervical / Trunk Assessment Cervical / Trunk Assessment: Normal   Balance Balance Balance Assessed: Yes Dynamic Standing Balance Dynamic Standing - Balance Support: Bilateral upper extremity supported Dynamic Standing - Level of Assistance: 4: Min assist  End of Session PT - End of Session Equipment Utilized During Treatment: Gait belt;Other (comment) (cam boot) Activity Tolerance: Patient limited by fatigue;Patient limited by pain Patient left: in chair;with call bell/phone within reach;with family/visitor present Nurse Communication: Mobility status;Precautions;Weight bearing status;Other (comment) (difficutly in maintaining TDWB status)  GP Functional Assessment Tool Used: clinical judgement Functional Limitation: Mobility: Walking and moving around Mobility: Walking and Moving Around Current Status [phone]): At least 40 percent but less than 60 percent impaired, limited or restricted Mobility: Walking and Moving Around Goal Status [phone]): At least 20 percent but less than 40 percent impaired, limited or restricted   Ferman Hamming 04/28/2013, 1:10 PM Weldon Picking PT Acute Rehab Services [phone] Beeper [phone]

## 2013-04-28 NOTE — Progress Notes (Signed)
04/28/13 Spoke with patient about HHC. She selected [facility] from the [employer]. Dava Najjar with [facility] and set up HHPT. Contacted Darian with [facility] and requested wheelchair, 3N1 and rolling walker be delivered to patient's room prior to discharge on 04/29/13. Jacquelynn Cree RN, BSN, CCM

## 2013-04-29 LAB — GLUCOSE, CAPILLARY
Glucose-Capillary: 77 mg/dL (ref 70–99)
Glucose-Capillary: 85 mg/dL (ref 70–99)
Glucose-Capillary: 78 mg/dL (ref 70–99)

## 2013-04-29 MED ORDER — PNEUMOCOCCAL VAC POLYVALENT 25 MCG/0.5ML IJ INJ
0.5000 mL | INJECTION | INTRAMUSCULAR | Status: AC
  Administered 2013-04-29: 0.5 mL via INTRAMUSCULAR
  Filled 2013-04-29: qty 0.5

## 2013-04-29 MED ORDER — INFLUENZA VAC SPLIT QUAD 0.5 ML IM SUSP
0.5000 mL | INTRAMUSCULAR | Status: AC
  Administered 2013-04-29: 0.5 mL via INTRAMUSCULAR
  Filled 2013-04-29: qty 0.5

## 2013-04-29 NOTE — Discharge Summary (Signed)
Patient ID: Melinda Drake MRN: [id number] DOB/AGE: 1955/10/21 57 y.o.  Admit date: 04/27/2013 Discharge date: 04/29/2013  Admission Diagnoses:  Active Problems:   * No active hospital problems. *   Discharge Diagnoses:  Same  Past Medical History  Diagnosis Date  . Diabetes mellitus without complication   . Non Hodgkin's lymphoma 1997  . Bronchitis   . GERD (gastroesophageal reflux disease)   . Arthritis   . H/O stem cell transplant 1997  . History of biopsy 1997    left side neck    Surgeries: Procedure(s): ANKLE FUSION- left on 04/27/2013   Consultants:    Discharged Condition: Improved  Hospital Course: Melinda Drake is an 57 y.o. female who was admitted 04/27/2013 for operative treatment of<principal problem not specified>. Patient has severe unremitting pain that affects sleep, daily activities, and work/hobbies. After pre-op clearance the patient was taken to the operating room on 04/27/2013 and underwent  Procedure(s): ANKLE FUSION- left.    Patient was given perioperative antibiotics: Anti-infectives   Start     Dose/Rate Route Frequency Ordered Stop   04/27/13 1545  ceFAZolin (ANCEF) IVPB 1 g/50 mL premix     1 g 100 mL/hr over 30 Minutes Intravenous Every 6 hours 04/27/13 1414 04/28/13 0455   04/27/13 0600  ceFAZolin (ANCEF) IVPB 2 g/50 mL premix     2 g 100 mL/hr over 30 Minutes Intravenous On call to O.R. 04/26/13 1417 04/27/13 0839       Patient was given sequential compression devices, early ambulation, and chemoprophylaxis to prevent DVT.  Patient benefited maximally from hospital stay and there were no complications.    Recent vital signs: Patient Vitals for the past 24 hrs:  BP Temp Pulse Resp SpO2 Height Weight  04/29/13 0543 125/52 mmHg 99.9 F (37.7 C) 84 18 98 % - -  04/29/13 0300 - 99.3 F (37.4 C) - - - - -  04/28/13 2049 109/47 mmHg 99.7 F (37.6 C) 91 18 98 % - -  04/28/13 1608 97/48 mmHg 97.9 F (36.6 C) 78 18 91 % - -   04/28/13 1018 - - - - - 5' 4.5" (1.638 m) 76.34 kg (168 lb 4.8 oz)     Recent laboratory studies: No results found for this basename: WBC, HGB, HCT, PLT, NA, K, CL, CO2, BUN, CREATININE, GLUCOSE, PT, INR, CALCIUM, 2,  in the last 72 hours   Discharge Medications:     Medication List         ADVIL 200 MG Caps  Generic drug:  Ibuprofen  Take 400 mg by mouth daily as needed (pain).     atorvastatin 10 MG tablet  Commonly known as:  LIPITOR  Take 10 mg by mouth daily.     calcium carbonate 500 MG chewable tablet  Commonly known as:  TUMS - dosed in mg elemental calcium  Chew 1 tablet by mouth daily as needed for heartburn.     cetirizine 10 MG tablet  Commonly known as:  ZYRTEC  Take 10 mg by mouth daily.     HYDROcodone-acetaminophen 5-325 MG per tablet  Commonly known as:  NORCO  Take 1 tablet by mouth every 6 (six) hours as needed for pain.     insulin glargine 100 UNIT/ML injection  Commonly known as:  LANTUS  Inject 20 Units into the skin at bedtime.     Vitamin D3 2000 UNITS Tabs  Take 2,000 mg by mouth daily.        Diagnostic Studies:  Dg Chest 2 View  04/20/2013   CLINICAL DATA:  57 year old female preoperative study for lower extremity surgery.  EXAM: CHEST  2 VIEW  COMPARISON:  None.  FINDINGS: Lung volumes are within normal limits. No pneumothorax, pulmonary edema, pleural effusion or confluent pulmonary opacity.  Mild S-shaped thoracolumbar scoliosis.  Cardiac size appears within normal limits. There is a double density along the right mediastinum and heart border which appears to continue into the abdomen (arrows). On the lateral view there is no correlation or air-fluid level, but there is suggestion of tortuous thoracic aorta. Visualized tracheal air column is within normal limits. Other mediastinal contours are within normal limits.  IMPRESSION: 1.  No acute pulmonary process.  2. Abnormal right mediastinal contours/double density. The appearance most resembles  a dilated thoracic esophagus such as due to achalasia, but this is not not confirmed (no esophageal fluid level). Query history of achalasia or dysphagia.  Other top consideration is highly tortuous thoracic aorta. Less likely other nonspecific mediastinal soft tissue. Chest CT (IV contrast preferred) would evaluate further.  These results will be called to the ordering clinician or representative by the Radiologist Assistant, and communication documented in the PACS Dashboard.   Electronically Signed   By: Augusto Gamble M.D.   On: 04/20/2013 11:17    Disposition:  To home      Discharge Orders   Future Orders Complete By Expires   Call MD / Call 911  As directed    Comments:     If you experience chest pain or shortness of breath, CALL 911 and be transported to the hospital emergency room.  If you develope a fever above 101 F, pus (white drainage) or increased drainage or redness at the wound, or calf pain, call your surgeon's office.   Call MD / Call 911  As directed    Comments:     If you experience chest pain or shortness of breath, CALL 911 and be transported to the hospital emergency room.  If you develope a fever above 101 F, pus (white drainage) or increased drainage or redness at the wound, or calf pain, call your surgeon's office.   Constipation Prevention  As directed    Comments:     Drink plenty of fluids.  Prune juice may be helpful.  You may use a stool softener, such as Colace (over the counter) 100 mg twice a day.  Use MiraLax (over the counter) for constipation as needed.   Constipation Prevention  As directed    Comments:     Drink plenty of fluids.  Prune juice may be helpful.  You may use a stool softener, such as Colace (over the counter) 100 mg twice a day.  Use MiraLax (over the counter) for constipation as needed.   Diet - low sodium heart healthy  As directed    Diet - low sodium heart healthy  As directed    Discharge patient  As directed    Increase activity slowly as  tolerated  As directed    Increase activity slowly as tolerated  As directed       Follow-up Information   Follow up with DUDA,MARCUS V, MD In 1 week.   Specialty:  Orthopedic Surgery   Contact information:   [address] [place] [address] [phone]       Follow up with [facility].   Contact information:   [address] [place] [address] [phone]  SignedKathryne Hitch 04/29/2013, 7:03 AM

## 2013-04-29 NOTE — Progress Notes (Signed)
Subjective: 2 Days Post-Op Procedure(s) (LRB): ANKLE FUSION- left (Left) Patient reports pain as mild.    Objective: Vital signs in last 24 hours: Temp:  [97.9 F (36.6 C)-99.9 F (37.7 C)] 99.9 F (37.7 C) (09/19 0543) Pulse Rate:  [78-91] 84 (09/19 0543) Resp:  [18] 18 (09/19 0543) BP: (97-125)/(47-52) 125/52 mmHg (09/19 0543) SpO2:  [91 %-98 %] 98 % (09/19 0543) Weight:  [76.34 kg (168 lb 4.8 oz)] 76.34 kg (168 lb 4.8 oz) (09/18 1018)  Intake/Output from previous day:   Intake/Output this shift:    No results found for this basename: HGB,  in the last 72 hours No results found for this basename: WBC, RBC, HCT, PLT,  in the last 72 hours No results found for this basename: NA, K, CL, CO2, BUN, CREATININE, GLUCOSE, CALCIUM,  in the last 72 hours No results found for this basename: LABPT, INR,  in the last 72 hours  Intact pulses distally Incision: no drainage  Assessment/Plan: 2 Days Post-Op Procedure(s) (LRB): ANKLE FUSION- left (Left) Discharge to home today.  Lichelle Viets Y 04/29/2013, 7:02 AM

## 2013-04-29 NOTE — Progress Notes (Signed)
Patient d/c to home, IV removed, prescriptions given and d/c instructions reviewed.  Equipment has been delivered, HHPT in place.

## 2013-04-29 NOTE — Progress Notes (Signed)
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  She underwent triple arthrodesis and is now TDWB L LE.   PT Comments   Pt. Is mobilizing better today but only able to complete short distances with RW.  Is managing her w/c well on level surfaces.  Reminded pt. Of correct technique for nephew to help her up curb in the w/c  if there is not a ramp for her to access at her apartment.   Follow Up Recommendations  Home health PT;Supervision/Assistance - 24 hour     Does the patient have the potential to tolerate intense rehabilitation     Barriers to Discharge        Equipment Recommendations  Rolling walker with 5" wheels;Wheelchair (measurements PT);Wheelchair cushion (measurements PT);3in1 (PT)    Recommendations for Other Services    Frequency Min 6X/week   Progress towards PT Goals Progress towards PT goals: Progressing toward goals  Plan Current plan remains appropriate    Precautions / Restrictions Precautions Precautions: Fall Required Braces or Orthoses: Other Brace/Splint Other Brace/Splint: cam boot L foot/ankle Restrictions Weight Bearing Restrictions: Yes LLE Weight Bearing: Touchdown weight bearing   Pertinent Vitals/Pain See vitals tab     Mobility  Bed Mobility Bed Mobility: Supine to Sit;Sitting - Scoot to Edge of Bed Supine to Sit: 6: Modified independent (Device/Increase time);HOB flat Sitting - Scoot to Edge of Bed: 6: Modified independent (Device/Increase time) Sit to Supine: Not Tested (comment) Details for Bed Mobility Assistance: pt. continues to manage L LE from supine to sit with use of bed sheet and was at mod I level this am Transfers Transfers: Sit to Stand;Stand to Sit Sit to Stand: 4:  Min guard;With upper extremity assist;From bed Stand to Sit: 4: Min guard;With upper extremity assist;With armrests;To chair/3-in-1 (wheelchair then recliner) Details for Transfer Assistance: min guard assist for safety and balance.  Pt. practiced transfers from w/c to recliner. Ambulation/Gait Ambulation/Gait Assistance: 4: Min assist Ambulation Distance (Feet): 60 Feet Assistive device: Rolling walker Ambulation/Gait Assistance Details: cues for maintaining TDWB status and min assist for safety and stability Gait Pattern: Step-to pattern Gait velocity: decreased Wheelchair Mobility Wheelchair Mobility: Yes Wheelchair Assistance: 5: Supervision Wheelchair Assistance Details (indicate cue type and reason): Pt.'s w/c in room.  She was educated on safe operation of chair and of it's features.  She was able to mobiliize herself with use of R LE and both UEs. Good awareness of need to lock brakes. 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  She underwent triple arthrodesis and is now TDWB L LE.    Subjective Data  Subjective: I like the wheel chair.  I feel more confident with it htan on the walker. Patient Stated Goal: home and resume independent function   Cognition  Cognition Arousal/Alertness: Awake/alert Behavior During Therapy: WFL for tasks  assessed/performed Overall Cognitive Status: Within Functional Limits for tasks assessed    Balance     End of Session PT - End of Session Equipment  Utilized During Treatment: Gait belt;Other (comment) (cam boot) Activity Tolerance: Patient tolerated treatment well;Patient limited by fatigue Patient left: in chair;with call bell/phone within reach Nurse Communication: Mobility status   GP Functional Assessment Tool Used: clinical judgement Functional Limitation: Mobility: Walking and moving around Mobility: Walking and Moving Around Goal Status [phone]): At least 20 percent but less than 40 percent impaired, limited or restricted Mobility: Walking and Moving Around Discharge Status [phone]): At least 20 percent but less than 40 percent impaired, limited or restricted   Ferman Hamming 04/29/2013, 3:58 PM Weldon Picking PT Acute Rehab Services [phone] [place] [phone]

## 2013-05-02 ENCOUNTER — Other Ambulatory Visit: Payer: Self-pay | Admitting: Orthopedic Surgery

## 2013-05-02 DIAGNOSIS — R9389 Abnormal findings on diagnostic imaging of other specified body structures: Secondary | ICD-10-CM

## 2013-05-06 ENCOUNTER — Ambulatory Visit
Admission: RE | Admit: 2013-05-06 | Discharge: 2013-05-06 | Disposition: A | Discharge status: Home / Self Care | Payer: BC Managed Care – PPO | Source: Ambulatory Visit | Attending: Orthopedic Surgery | Admitting: Orthopedic Surgery

## 2013-05-06 DIAGNOSIS — R9389 Abnormal findings on diagnostic imaging of other specified body structures: Secondary | ICD-10-CM

## 2013-08-19 ENCOUNTER — Other Ambulatory Visit: Payer: Self-pay

## 2013-08-19 DIAGNOSIS — Z1231 Encounter for screening mammogram for malignant neoplasm of breast: Secondary | ICD-10-CM

## 2013-09-07 ENCOUNTER — Ambulatory Visit
Admission: RE | Admit: 2013-09-07 | Discharge: 2013-09-07 | Disposition: A | Discharge status: Home / Self Care | Payer: BC Managed Care – PPO | Source: Ambulatory Visit

## 2013-09-07 DIAGNOSIS — Z1231 Encounter for screening mammogram for malignant neoplasm of breast: Secondary | ICD-10-CM

## 2013-11-29 ENCOUNTER — Other Ambulatory Visit (HOSPITAL_COMMUNITY)
Admission: RE | Admit: 2013-11-29 | Discharge: 2013-11-29 | Disposition: A | Discharge status: Home / Self Care | Payer: BC Managed Care – PPO | Source: Ambulatory Visit | Attending: Obstetrics and Gynecology | Admitting: Obstetrics and Gynecology

## 2013-11-29 ENCOUNTER — Other Ambulatory Visit: Payer: Self-pay | Admitting: Obstetrics and Gynecology

## 2013-11-29 DIAGNOSIS — Z1151 Encounter for screening for human papillomavirus (HPV): Secondary | ICD-10-CM | POA: Insufficient documentation

## 2013-11-29 DIAGNOSIS — Z01419 Encounter for gynecological examination (general) (routine) without abnormal findings: Secondary | ICD-10-CM | POA: Insufficient documentation

## 2014-12-26 ENCOUNTER — Other Ambulatory Visit (HOSPITAL_COMMUNITY)
Admission: RE | Admit: 2014-12-26 | Discharge: 2014-12-26 | Disposition: A | Discharge status: Home / Self Care | Payer: BLUE CROSS/BLUE SHIELD | Source: Ambulatory Visit | Attending: Obstetrics and Gynecology | Admitting: Obstetrics and Gynecology

## 2014-12-26 ENCOUNTER — Other Ambulatory Visit: Payer: Self-pay | Admitting: Obstetrics and Gynecology

## 2014-12-26 DIAGNOSIS — Z01419 Encounter for gynecological examination (general) (routine) without abnormal findings: Secondary | ICD-10-CM | POA: Diagnosis not present

## 2014-12-27 LAB — CYTOLOGY - PAP

## 2015-01-22 ENCOUNTER — Other Ambulatory Visit: Payer: Self-pay | Admitting: Physician Assistant

## 2015-01-22 DIAGNOSIS — R1310 Dysphagia, unspecified: Secondary | ICD-10-CM

## 2015-01-24 ENCOUNTER — Ambulatory Visit
Admission: RE | Admit: 2015-01-24 | Discharge: 2015-01-24 | Disposition: A | Discharge status: Home / Self Care | Payer: BLUE CROSS/BLUE SHIELD | Source: Ambulatory Visit | Attending: Physician Assistant | Admitting: Physician Assistant

## 2015-01-24 DIAGNOSIS — R1310 Dysphagia, unspecified: Secondary | ICD-10-CM

## 2015-03-05 ENCOUNTER — Encounter (HOSPITAL_COMMUNITY)
Admission: RE | Disposition: A | Discharge status: Home / Self Care | Payer: Self-pay | Source: Ambulatory Visit | Attending: Gastroenterology

## 2015-03-05 ENCOUNTER — Ambulatory Visit (HOSPITAL_COMMUNITY)
Admission: RE | Admit: 2015-03-05 | Discharge: 2015-03-05 | Disposition: A | Discharge status: Home / Self Care | Payer: BLUE CROSS/BLUE SHIELD | Source: Ambulatory Visit | Attending: Gastroenterology | Admitting: Gastroenterology

## 2015-03-05 DIAGNOSIS — K220 Achalasia of cardia: Secondary | ICD-10-CM | POA: Insufficient documentation

## 2015-03-05 DIAGNOSIS — R1310 Dysphagia, unspecified: Secondary | ICD-10-CM | POA: Diagnosis present

## 2015-03-05 HISTORY — PX: ESOPHAGEAL MANOMETRY: SHX5429

## 2015-03-05 SURGERY — MANOMETRY, ESOPHAGUS

## 2015-03-05 MED ORDER — LIDOCAINE VISCOUS 2 % MT SOLN
OROMUCOSAL | Status: AC
  Filled 2015-03-05: qty 15

## 2015-03-05 SURGICAL SUPPLY — 2 items
FACESHIELD LNG OPTICON STERILE (SAFETY) IMPLANT
GLOVE BIO SURGEON STRL SZ8 (GLOVE) ×6 IMPLANT

## 2015-03-06 ENCOUNTER — Encounter (HOSPITAL_COMMUNITY): Payer: Self-pay | Admitting: Gastroenterology

## 2015-03-20 ENCOUNTER — Other Ambulatory Visit: Payer: Self-pay | Admitting: Surgery

## 2015-03-20 NOTE — H&P (Signed)
Melinda Drake 03/20/2015 11:07 AM Location: [facility] Patient #: [id number] DOB: 1956-08-02 Single / Language: Melinda Drake / Race: Black or African American Female History of Present Illness Melinda Hector MD; 03/20/2015 1:19 PM) Patient words: achalasia.  The patient is a 59 year old female who presents with gastroesophageal reflux disease. Patient sent for surgical consultation by Dr. Clarene Essex for concern of achalasia.  Pleasant woman. Diabetic control with Lantus. Former smoker. Has noted worsening dysphagia for over a decade. Worked up when she lived in [place]. Recently relocated to [place]. Also having Botox injections done once endoscopically. Notes his had worsening dysphagia to solids over the past few years. Often has to drink several glasses of water to help flush things into her stomach. Had issues of nausea vomiting and pseudo-reflux. Less of an issue lately but has modified to small or more frequent meals. Often has her hypereructation. No history of abdominal surgeries. She walks 3 miles a day without difficulty. Has bowel movements on most days.  Because of persistent/worsening dysphagia and diagnosis of achalasia, she was sent to gastroenterology. Dr. Watt Climes noted obstruction on fluoroscopy and confirmed on endoscopy. Manometry shows complete aperistalsis. Lower esophageal sphincter seems weak on manometry but that clashes with obvious tight narrowing on endoscopy and fluoroscopy.    Other Problems Melinda Drake, CMA; 03/20/2015 11:07 AM) Arthritis Cancer Diabetes Mellitus Gastroesophageal Reflux Disease Hypercholesterolemia  Past Surgical History Melinda Drake, CMA; 03/20/2015 11:07 AM) Cataract Surgery Bilateral. Colon Polyp Removal - Colonoscopy Sentinel Lymph Node Biopsy  Diagnostic Studies History Melinda Drake, CMA; 03/20/2015 11:07 AM) Colonoscopy within last year Mammogram 1-3 years ago Pap Smear 1-5 years ago  Allergies  Melinda Drake, CMA; 03/20/2015 11:09 AM) Codeine Phosphate *ANALGESICS - OPIOID*  Medication History (Sonya Drake, CMA; 03/20/2015 11:10 AM) Atorvastatin Calcium (10MG  Tablet, Oral) Active. ZyrTEC Allergy (10MG  Capsule, Oral) Active. Vitamin D3 (2000UNIT Capsule, Oral) Active. Lantus SoloStar (100UNIT/ML Soln Pen-inj, Subcutaneous) Active. Medications Reconciled  Social History Melinda Drake, CMA; 03/20/2015 11:07 AM) Alcohol use Occasional alcohol use. Caffeine use Carbonated beverages, Coffee. No drug use Tobacco use Former smoker.  Family History Melinda Drake, [place]; 03/20/2015 11:07 AM) Alcohol Abuse Father. Arthritis Mother, Sister. Cancer Mother. Cerebrovascular Accident Family Members In [place] Members In General. Colon Polyps Family Members In General. Diabetes Mellitus Mother. Heart Disease Mother. Heart disease in female family member before age 47 Hypertension Mother, Sister. Ovarian Cancer Family Members In General. Prostate Cancer Family Members In General. Thyroid problems Sister.  Pregnancy / Birth History Melinda Drake, [place]; 03/20/2015 11:07 AM) Age at menarche 55 years. Age of menopause 62-60 Gravida 0 Irregular periods Para 0     Review of Systems Melinda Drake CMA; 03/20/2015 11:07 AM) General Not Present- Appetite Loss, Chills, Fatigue, Fever, Night Sweats, Weight Gain and Weight Loss. Skin Not Present- Change in Wart/Mole, Dryness, Hives, Jaundice, New Lesions, Non-Healing Wounds, Rash and Ulcer. HEENT Present- Seasonal Allergies. Not Present- Earache, Hearing Loss, Hoarseness, Nose Bleed, Oral Ulcers, Ringing in the Ears, Sinus Pain, Sore Throat, Visual Disturbances, Wears glasses/contact lenses and Yellow Eyes. Respiratory Present- Snoring. Not Present- Bloody sputum, Chronic Cough, Difficulty Breathing and Wheezing. Breast Not Present- Breast Mass, Breast Pain, Nipple Discharge and Skin Changes. Cardiovascular  Present- Leg Cramps. Not Present- Chest Pain, Difficulty Breathing Lying Down, Palpitations, Rapid Heart Rate, Shortness of Breath and Swelling of Extremities. Gastrointestinal Present- Constipation and Difficulty Swallowing. Not Present- Abdominal Pain, Bloating, Bloody Stool, Change in Bowel Habits, Chronic diarrhea, Excessive gas, Gets full  quickly at meals, Hemorrhoids, Indigestion, Nausea, Rectal Pain and Vomiting. Female Genitourinary Not Present- Frequency, Nocturia, Painful Urination, Pelvic Pain and Urgency. Musculoskeletal Present- Joint Pain and Joint Stiffness. Not Present- Back Pain, Muscle Pain, Muscle Weakness and Swelling of Extremities. Neurological Not Present- Decreased Memory, Fainting, Headaches, Numbness, Seizures, Tingling, Tremor, Trouble walking and Weakness. Psychiatric Not Present- Anxiety, Bipolar, Change in Sleep Pattern, Depression, Fearful and Frequent crying. Endocrine Not Present- Cold Intolerance, Excessive Hunger, Hair Changes, Heat Intolerance, Hot flashes and New Diabetes. Hematology Not Present- Easy Bruising, Excessive bleeding, Gland problems, HIV and Persistent Infections.  Vitals (Sonya Drake CMA; 03/20/2015 11:08 AM) 03/20/2015 11:07 AM Weight: 182 lb Height: 64in Body Surface Area: 1.93 m Body Mass Index: 31.24 kg/m Temp.: 98.67F(Temporal)  Pulse: 74 (Regular)  BP: 132/77 (Sitting, Left Arm, Standard)     Physical Exam Melinda Hector MD; 03/20/2015 11:51 AM)  General Mental Status-Alert. General Appearance-Not in acute distress, Not Sickly. Orientation-Oriented X3. Hydration-Well hydrated. Voice-Normal.  Integumentary Global Assessment Upon inspection and palpation of skin surfaces of the - Axillae: non-tender, no inflammation or ulceration, no drainage. and Distribution of scalp and body hair is normal. General Characteristics Temperature - normal warmth is noted.  Head and Neck Head-normocephalic, atraumatic with  no lesions or palpable masses. Face Global Assessment - atraumatic, no absence of expression. Neck Global Assessment - no abnormal movements, no bruit auscultated on the right, no bruit auscultated on the left, no decreased range of motion, non-tender. Trachea-midline. Thyroid Gland Characteristics - non-tender.  Eye Eyeball - Left-Extraocular movements intact, No Nystagmus. Eyeball - Right-Extraocular movements intact, No Nystagmus. Cornea - Left-No Hazy. Cornea - Right-No Hazy. Sclera/Conjunctiva - Left-No scleral icterus, No Discharge. Sclera/Conjunctiva - Right-No scleral icterus, No Discharge. Pupil - Left-Direct reaction to light normal. Pupil - Right-Direct reaction to light normal.  ENMT Ears Pinna - Left - no drainage observed, no generalized tenderness observed. Right - no drainage observed, no generalized tenderness observed. Nose and Sinuses External Inspection of the Nose - no destructive lesion observed. Inspection of the nares - Left - quiet respiration. Right - quiet respiration. Mouth and Throat Lips - Upper Lip - no fissures observed, no pallor noted. Lower Lip - no fissures observed, no pallor noted. Nasopharynx - no discharge present. Oral Cavity/Oropharynx - Tongue - no dryness observed. Oral Mucosa - no cyanosis observed. Hypopharynx - no evidence of airway distress observed.  Chest and Lung Exam Inspection Movements - Normal and Symmetrical. Accessory muscles - No use of accessory muscles in breathing. Palpation Palpation of the chest reveals - Non-tender. Auscultation Breath sounds - Normal and Clear.  Cardiovascular Auscultation Rhythm - Regular. Murmurs & Other Heart Sounds - Auscultation of the heart reveals - No Murmurs and No Systolic Clicks.  Abdomen Inspection Inspection of the abdomen reveals - No Visible peristalsis and No Abnormal pulsations. Umbilicus - No Bleeding, No Urine drainage. Palpation/Percussion Palpation and  Percussion of the abdomen reveal - Soft, Non Tender, No Rebound tenderness, No Rigidity (guarding) and No Cutaneous hyperesthesia. Note: Overweight but soft. No diastases. No umbilical hernia.   Female Genitourinary Sexual Maturity Tanner 5 - Adult hair pattern. Note: No vaginal bleeding nor discharge. No inguinal hernias   Peripheral Vascular Upper Extremity Inspection - Left - No Cyanotic nailbeds, Not Ischemic. Right - No Cyanotic nailbeds, Not Ischemic.  Neurologic Neurologic evaluation reveals -normal attention span and ability to concentrate, able to name objects and repeat phrases. Appropriate fund of knowledge , normal sensation and normal coordination. Mental Status Affect -  not angry, not paranoid. Cranial Nerves-Normal Bilaterally. Gait-Normal.  Neuropsychiatric Mental status exam performed with findings of-able to articulate well with normal speech/language, rate, volume and coherence, thought content normal with ability to perform basic computations and apply abstract reasoning and no evidence of hallucinations, delusions, obsessions or homicidal/suicidal ideation.  Musculoskeletal Global Assessment Spine, Ribs and Pelvis - no instability, subluxation or laxity. Right Upper Extremity - no instability, subluxation or laxity.  Lymphatic Head & Neck  General Head & Neck Lymphatics: Bilateral - Description - No Localized lymphadenopathy. Axillary  General Axillary Region: Bilateral - Description - No Localized lymphadenopathy. Femoral & Inguinal  Generalized Femoral & Inguinal Lymphatics: Left - Description - No Localized lymphadenopathy. Right - Description - No Localized lymphadenopathy.    Assessment & Plan Melinda Hector MD; 03/20/2015 1:22 PM)  ACHALASIA (530.0  K22.0) Impression: History and endoscopic/fluoroscopic/manometric findings strongly suspicious for achalasia. While the manometric findings implied a poor contracting LES, I suspect the  probe didn't actually go across it and was more in the distal esophagus given the fact that her LES was tight on endoscopy and tight on fluoroscopy.  I think she would benefit from surgical esophagocardiomyotomy. Minimally invasive approach. Robotically. Plan partial fundoplication. Usually a posterior toupet fundoplication.  She is interested seeing did not really make a decision yet. Most likely she will do this in the fall when she has more time to recover.  Current Plans Schedule for Surgery Written instructions provided The anatomy & physiology of the foregut and anti-reflux mechanism was discussed. The pathophysiology of achalasia was discussed. Natural history risks without surgery was discussed. The patient's symptoms are not adequately controlled by non-operative treatments. I feel the risks of no intervention will lead to serious problems that outweigh the operative risks; therefore, I recommended surgery to relax the fibrotic LES & rebuild the anti-reflux valve to control reflux. Need for a thorough workup to rule out the differential diagnosis and plan treatment was explained. I explained laparoscopic techniques with possible need for an open approach.  Risks such as bleeding, infection, abscess, leak, need for further treatment, heart attack, death, and other risks were discussed. I noted a good likelihood this will help address the problem. Goals of post-operative recovery were discussed as well. Possibility that this will not correct all symptoms was explained. Post-operative dysphagia, need for short-term liquid & pureed diet, inability to vomit, possibility of recurrence needing further treatment, possible need for medicines to help control symptoms in addition to surgery were discussed. We will work to minimize complications. Educational handouts further explaining the pathology, treatment options, and dysphagia diet was given as well. Questions were answered. The patient expresses  understanding & wishes to proceed with surgery. Pt Education - [facility] Achalasia HCI (Melika Reder) Pt Education - [facility] Esophageal Surgery Diet HCI (Graycen Sadlon): discussed with patient and provided information. Pt Education - [facility] Laparosopic Post Op HCI (Viet Kemmerer) Pt Education - [facility] Good Bowel Health (Noella Kipnis)  Melinda Drake, M.D., F.A.C.S. Gastrointestinal and Minimally Invasive Surgery [facility] [address] [place], [place] [address] [phone] Main / Paging

## 2015-04-25 NOTE — Patient Instructions (Signed)
Melinda Drake  04/25/2015   Your procedure is scheduled on:   05/03/2015    Report to [facility] Main  Entrance take [place]  elevators to 3rd floor to  [place] at    [place] AM.  Call this number if you have problems the morning of surgery [phone]   Remember: ONLY 1 PERSON MAY GO WITH YOU TO SHORT STAY TO GET  READY MORNING OF [place].  Do not eat food or drink liquids :After Midnight.               Take 1/2 of evening dose of Insulin nite before surgery.   Take these medicines the morning of surgery with A SIP OF WATER:   Zyrtec                                You may not have any metal on your body including hair pins and              piercings  Do not wear jewelry, make-up, lotions, powders or perfumes, deodorant             Do not wear nail polish.  Do not shave  48 hours prior to surgery.                Do not bring valuables to the hospital. [place].  Contacts, dentures or bridgework may not be worn into surgery.  Leave suitcase in the car. After surgery it may be brought to your room.       Special Instructions: coughing and deep breathing exercises, leg exercises               Please read over the following fact sheets you were given: _____________________________________________________________________             [facility] - Preparing for Surgery Before surgery, you can play an important role.  Because skin is not sterile, your skin needs to be as free of germs as possible.  You can reduce the number of germs on your skin by washing with CHG (chlorahexidine gluconate) soap before surgery.  CHG is an antiseptic cleaner which kills germs and bonds with the skin to continue killing germs even after washing. Please DO NOT use if you have an allergy to CHG or antibacterial soaps.  If your skin becomes reddened/irritated stop using the CHG and inform your nurse when you arrive at Short  Stay. Do not shave (including legs and underarms) for at least 48 hours prior to the first CHG shower.  You may shave your face/neck. Please follow these instructions carefully:  1.  Shower with CHG Soap the night before surgery and the  morning of Surgery.  2.  If you choose to wash your hair, wash your hair first as usual with your  normal  shampoo.  3.  After you shampoo, rinse your hair and body thoroughly to remove the  shampoo.                           4.  Use CHG as you would any other liquid soap.  You can apply chg directly  to the skin and wash  Gently with a scrungie or clean washcloth.  5.  Apply the CHG Soap to your body ONLY FROM THE NECK DOWN.   Do not use on face/ open                           Wound or open sores. Avoid contact with eyes, ears mouth and genitals (private parts).                       Wash face,  Genitals (private parts) with your normal soap.             6.  Wash thoroughly, paying special attention to the area where your surgery  will be performed.  7.  Thoroughly rinse your body with warm water from the neck down.  8.  DO NOT shower/wash with your normal soap after using and rinsing off  the CHG Soap.                9.  Pat yourself dry with a clean towel.            10.  Wear clean pajamas.            11.  Place clean sheets on your bed the night of your first shower and do not  sleep with pets. Day of Surgery : Do not apply any lotions/deodorants the morning of surgery.  Please wear clean clothes to the hospital/surgery center.  FAILURE TO FOLLOW THESE INSTRUCTIONS MAY RESULT IN THE CANCELLATION OF YOUR SURGERY PATIENT SIGNATURE_________________________________  NURSE SIGNATURE__________________________________  ________________________________________________________________________  WHAT IS A BLOOD TRANSFUSION? Blood Transfusion Information  A transfusion is the replacement of blood or some of its parts. Blood is made up of  multiple cells which provide different functions.  Red blood cells carry oxygen and are used for blood loss replacement.  White blood cells fight against infection.  Platelets control bleeding.  Plasma helps clot blood.  Other blood products are available for specialized needs, such as hemophilia or other clotting disorders. BEFORE THE TRANSFUSION  Who gives blood for transfusions?   Healthy volunteers who are fully evaluated to make sure their blood is safe. This is blood bank blood. Transfusion therapy is the safest it has ever been in the practice of medicine. Before blood is taken from a donor, a complete history is taken to make sure that person has no history of diseases nor engages in risky social behavior (examples are intravenous drug use or sexual activity with multiple partners). The donor's travel history is screened to minimize risk of transmitting infections, such as malaria. The donated blood is tested for signs of infectious diseases, such as HIV and hepatitis. The blood is then tested to be sure it is compatible with you in order to minimize the chance of a transfusion reaction. If you or a relative donates blood, this is often done in anticipation of surgery and is not appropriate for emergency situations. It takes many days to process the donated blood. RISKS AND COMPLICATIONS Although transfusion therapy is very safe and saves many lives, the main dangers of transfusion include:   Getting an infectious disease.  Developing a transfusion reaction. This is an allergic reaction to something in the blood you were given. Every precaution is taken to prevent this. The decision to have a blood transfusion has been considered carefully by your caregiver before blood is given. Blood is not given unless the benefits outweigh  the risks. AFTER THE TRANSFUSION  Right after receiving a blood transfusion, you will usually feel much better and more energetic. This is especially true if  your red blood cells have gotten low (anemic). The transfusion raises the level of the red blood cells which carry oxygen, and this usually causes an energy increase.  The nurse administering the transfusion will monitor you carefully for complications. HOME CARE INSTRUCTIONS  No special instructions are needed after a transfusion. You may find your energy is better. Speak with your caregiver about any limitations on activity for underlying diseases you may have. SEEK MEDICAL CARE IF:   Your condition is not improving after your transfusion.  You develop redness or irritation at the intravenous (IV) site. SEEK IMMEDIATE MEDICAL CARE IF:  Any of the following symptoms occur over the next 12 hours:  Shaking chills.  You have a temperature by mouth above 102 F (38.9 C), not controlled by medicine.  Chest, back, or muscle pain.  People around you feel you are not acting correctly or are confused.  Shortness of breath or difficulty breathing.  Dizziness and fainting.  You get a rash or develop hives.  You have a decrease in urine output.  Your urine turns a dark color or changes to pink, red, or brown. Any of the following symptoms occur over the next 10 days:  You have a temperature by mouth above 102 F (38.9 C), not controlled by medicine.  Shortness of breath.  Weakness after normal activity.  The white part of the eye turns yellow (jaundice).  You have a decrease in the amount of urine or are urinating less often.  Your urine turns a dark color or changes to pink, red, or brown. Document Released: 07/25/2000 Document Revised: 10/20/2011 Document Reviewed: 03/13/2008 [facility] Patient Information 2014 [place], [place].  _______________________________________________________________________

## 2015-04-27 ENCOUNTER — Encounter (HOSPITAL_COMMUNITY)
Admission: RE | Admit: 2015-04-27 | Discharge: 2015-04-27 | Disposition: A | Discharge status: Home / Self Care | Payer: BLUE CROSS/BLUE SHIELD | Source: Ambulatory Visit | Attending: Surgery | Admitting: Surgery

## 2015-04-27 ENCOUNTER — Encounter (HOSPITAL_COMMUNITY): Payer: Self-pay

## 2015-04-27 DIAGNOSIS — Z01818 Encounter for other preprocedural examination: Secondary | ICD-10-CM | POA: Diagnosis present

## 2015-04-27 DIAGNOSIS — K220 Achalasia of cardia: Secondary | ICD-10-CM | POA: Diagnosis not present

## 2015-04-27 HISTORY — DX: Nausea with vomiting, unspecified: Z98.890

## 2015-04-27 HISTORY — DX: Family history of other specified conditions: Z84.89

## 2015-04-27 HISTORY — DX: Nausea with vomiting, unspecified: R11.2

## 2015-04-27 LAB — CBC
HCT: 35.2 % — ABNORMAL LOW (ref 36.0–46.0)
Hemoglobin: 11.5 g/dL — ABNORMAL LOW (ref 12.0–15.0)
MCH: 30.6 pg (ref 26.0–34.0)
MCHC: 32.7 g/dL (ref 30.0–36.0)
MCV: 93.6 fL (ref 78.0–100.0)
PLATELETS: 312 10*3/uL (ref 150–400)
RBC: 3.76 MIL/uL — AB (ref 3.87–5.11)
RDW: 14.4 % (ref 11.5–15.5)
WBC: 11.7 10*3/uL — AB (ref 4.0–10.5)

## 2015-04-27 LAB — ABO/RH: ABO/RH(D): A POS

## 2015-04-27 LAB — BASIC METABOLIC PANEL
ANION GAP: 8 (ref 5–15)
BUN: 15 mg/dL (ref 6–20)
CALCIUM: 9.3 mg/dL (ref 8.9–10.3)
CO2: 29 mmol/L (ref 22–32)
Chloride: 104 mmol/L (ref 101–111)
Creatinine, Ser: 0.71 mg/dL (ref 0.44–1.00)
GLUCOSE: 113 mg/dL — AB (ref 65–99)
POTASSIUM: 4 mmol/L (ref 3.5–5.1)
SODIUM: 141 mmol/L (ref 135–145)

## 2015-04-27 NOTE — Progress Notes (Signed)
Need consent form completed in orders Thank you.

## 2015-04-27 NOTE — Progress Notes (Signed)
CBC results done 04/27/15 faxed via EPIC to Dr Johney [place].

## 2015-04-27 NOTE — Progress Notes (Signed)
Called office of Dr Johney [place] and spoke with Triage Nurse at office.  Nurse asked assistant of Dr Johney [place] regarding bowel prep.  Triage nurse stated patient is  Npo after midnite.

## 2015-04-30 NOTE — Progress Notes (Signed)
Final EKG done 04/27/15 in EPIC.

## 2015-05-02 NOTE — Anesthesia Preprocedure Evaluation (Addendum)
Anesthesia Evaluation  Patient identified by MRN, date of birth, ID band Patient awake    Reviewed: Allergy & Precautions, H&P , NPO status , Patient's Chart, lab work & pertinent test results  History of Anesthesia Complications (+) PONV  Airway Mallampati: II  TM Distance: >3 FB Neck ROM: full    Dental no notable dental hx. (+) Dental Advisory Given, Teeth Intact   Pulmonary neg pulmonary ROS, former smoker,    Pulmonary exam normal breath sounds clear to auscultation       Cardiovascular Exercise Tolerance: Good negative cardio ROS Normal cardiovascular exam Rhythm:regular Rate:Normal     Neuro/Psych negative neurological ROS  negative psych ROS   GI/Hepatic negative GI ROS, Neg liver ROS,   Endo/Other  diabetes, Well Controlled, Type 2, Insulin Dependent  Renal/GU negative Renal ROS  negative genitourinary   Musculoskeletal   Abdominal   Peds  Hematology negative hematology ROS (+) Blood dyscrasia, , Non-Hodgkin's lymphoma   Anesthesia Other Findings   Reproductive/Obstetrics negative OB ROS                           Anesthesia Physical Anesthesia Plan  ASA: III  Anesthesia Plan: General   Post-op Pain Management:    Induction: Intravenous  Airway Management Planned: Oral ETT  Additional Equipment:   Intra-op Plan:   Post-operative Plan: Extubation in OR  Informed Consent: I have reviewed the patients History and Physical, chart, labs and discussed the procedure including the risks, benefits and alternatives for the proposed anesthesia with the patient or authorized representative who has indicated his/her understanding and acceptance.   Dental Advisory Given  Plan Discussed with: CRNA and Surgeon  Anesthesia Plan Comments:         Anesthesia Quick Evaluation

## 2015-05-03 ENCOUNTER — Encounter (HOSPITAL_COMMUNITY)
Admission: RE | Disposition: A | Discharge status: Home / Self Care | Payer: Self-pay | Source: Ambulatory Visit | Attending: Surgery

## 2015-05-03 ENCOUNTER — Observation Stay (HOSPITAL_COMMUNITY)
Admission: RE | Admit: 2015-05-03 | Discharge: 2015-05-04 | Disposition: A | Discharge status: Home / Self Care | Payer: BLUE CROSS/BLUE SHIELD | Source: Ambulatory Visit | Attending: Surgery | Admitting: Surgery

## 2015-05-03 ENCOUNTER — Ambulatory Visit (HOSPITAL_COMMUNITY): Payer: BLUE CROSS/BLUE SHIELD | Admitting: Anesthesiology

## 2015-05-03 ENCOUNTER — Encounter (HOSPITAL_COMMUNITY): Payer: Self-pay | Admitting: *Deleted

## 2015-05-03 DIAGNOSIS — Z794 Long term (current) use of insulin: Secondary | ICD-10-CM | POA: Insufficient documentation

## 2015-05-03 DIAGNOSIS — K220 Achalasia of cardia: Principal | ICD-10-CM | POA: Insufficient documentation

## 2015-05-03 DIAGNOSIS — K219 Gastro-esophageal reflux disease without esophagitis: Secondary | ICD-10-CM | POA: Insufficient documentation

## 2015-05-03 DIAGNOSIS — Z8572 Personal history of non-Hodgkin lymphomas: Secondary | ICD-10-CM | POA: Insufficient documentation

## 2015-05-03 DIAGNOSIS — Z87891 Personal history of nicotine dependence: Secondary | ICD-10-CM | POA: Diagnosis not present

## 2015-05-03 DIAGNOSIS — Z23 Encounter for immunization: Secondary | ICD-10-CM | POA: Insufficient documentation

## 2015-05-03 DIAGNOSIS — E119 Type 2 diabetes mellitus without complications: Secondary | ICD-10-CM | POA: Diagnosis not present

## 2015-05-03 DIAGNOSIS — Z885 Allergy status to narcotic agent status: Secondary | ICD-10-CM | POA: Insufficient documentation

## 2015-05-03 DIAGNOSIS — E780 Pure hypercholesterolemia: Secondary | ICD-10-CM | POA: Insufficient documentation

## 2015-05-03 DIAGNOSIS — Z9484 Stem cells transplant status: Secondary | ICD-10-CM | POA: Diagnosis not present

## 2015-05-03 DIAGNOSIS — M1990 Unspecified osteoarthritis, unspecified site: Secondary | ICD-10-CM | POA: Insufficient documentation

## 2015-05-03 DIAGNOSIS — K660 Peritoneal adhesions (postprocedural) (postinfection): Secondary | ICD-10-CM | POA: Insufficient documentation

## 2015-05-03 DIAGNOSIS — J985 Diseases of mediastinum, not elsewhere classified: Secondary | ICD-10-CM | POA: Diagnosis not present

## 2015-05-03 HISTORY — PX: LAPAROSCOPIC LYSIS OF ADHESIONS: SHX5905

## 2015-05-03 LAB — GLUCOSE, CAPILLARY
Glucose-Capillary: 81 mg/dL (ref 65–99)
Glucose-Capillary: 151 mg/dL — ABNORMAL HIGH (ref 65–99)
Glucose-Capillary: 154 mg/dL — ABNORMAL HIGH (ref 65–99)
Glucose-Capillary: 119 mg/dL — ABNORMAL HIGH (ref 65–99)

## 2015-05-03 LAB — TYPE AND SCREEN
ABO/RH(D): A POS
Antibody Screen: NEGATIVE

## 2015-05-03 SURGERY — FUNDOPLICATION, NISSEN, ROBOT-ASSISTED, LAPAROSCOPIC
Anesthesia: General

## 2015-05-03 MED ORDER — MIDAZOLAM HCL 2 MG/2ML IJ SOLN
INTRAMUSCULAR | Status: AC
  Filled 2015-05-03: qty 4

## 2015-05-03 MED ORDER — ONDANSETRON HCL 40 MG/20ML IJ SOLN
8.0000 mg | Freq: Four times a day (QID) | INTRAMUSCULAR | Status: DC | PRN
  Filled 2015-05-03: qty 4

## 2015-05-03 MED ORDER — DEXAMETHASONE SODIUM PHOSPHATE 10 MG/ML IJ SOLN
INTRAMUSCULAR | Status: DC | PRN
  Administered 2015-05-03: 5 mg via INTRAVENOUS

## 2015-05-03 MED ORDER — ONDANSETRON HCL 4 MG PO TABS: 4.0000 mg | ORAL_TABLET | Freq: Four times a day (QID) | ORAL | Status: DC | PRN

## 2015-05-03 MED ORDER — OXYCODONE HCL 5 MG PO TABS: 5.0000 mg | ORAL_TABLET | ORAL | Status: DC | PRN

## 2015-05-03 MED ORDER — NEOSTIGMINE METHYLSULFATE 10 MG/10ML IV SOLN
INTRAVENOUS | Status: AC
  Filled 2015-05-03: qty 1

## 2015-05-03 MED ORDER — INSULIN ASPART 100 UNIT/ML ~~LOC~~ SOLN: 0.0000 [IU] | Freq: Every day | SUBCUTANEOUS | Status: DC

## 2015-05-03 MED ORDER — ZOLPIDEM TARTRATE 5 MG PO TABS: 5.0000 mg | ORAL_TABLET | Freq: Every evening | ORAL | Status: DC | PRN

## 2015-05-03 MED ORDER — PANTOPRAZOLE SODIUM 40 MG PO TBEC
40.0000 mg | DELAYED_RELEASE_TABLET | Freq: Two times a day (BID) | ORAL | Status: DC
  Filled 2015-05-03 (×2): qty 1

## 2015-05-03 MED ORDER — HYDROMORPHONE HCL 1 MG/ML IJ SOLN
0.5000 mg | INTRAMUSCULAR | Status: DC | PRN
  Administered 2015-05-03 – 2015-05-04 (×2): 0.5 mg via INTRAVENOUS
  Administered 2015-05-04: 1 mg via INTRAVENOUS
  Filled 2015-05-03 (×3): qty 1

## 2015-05-03 MED ORDER — INSULIN GLARGINE 100 UNIT/ML ~~LOC~~ SOLN: 20.0000 [IU] | Freq: Every day | SUBCUTANEOUS | Status: DC

## 2015-05-03 MED ORDER — MIDAZOLAM HCL 5 MG/5ML IJ SOLN
INTRAMUSCULAR | Status: DC | PRN
  Administered 2015-05-03: 2 mg via INTRAVENOUS

## 2015-05-03 MED ORDER — LIP MEDEX EX OINT
1.0000 "application " | TOPICAL_OINTMENT | Freq: Two times a day (BID) | CUTANEOUS | Status: DC
  Administered 2015-05-03: 1 via TOPICAL

## 2015-05-03 MED ORDER — LACTATED RINGERS IV SOLN: INTRAVENOUS | Status: DC

## 2015-05-03 MED ORDER — SUCCINYLCHOLINE CHLORIDE 20 MG/ML IJ SOLN
INTRAMUSCULAR | Status: DC | PRN
  Administered 2015-05-03: 100 mg via INTRAVENOUS

## 2015-05-03 MED ORDER — HYDROMORPHONE HCL 2 MG/ML IJ SOLN
INTRAMUSCULAR | Status: AC
  Filled 2015-05-03: qty 1

## 2015-05-03 MED ORDER — LIDOCAINE HCL (CARDIAC) 20 MG/ML IV SOLN
INTRAVENOUS | Status: DC | PRN
  Administered 2015-05-03: 50 mg via INTRAVENOUS

## 2015-05-03 MED ORDER — LORAZEPAM 2 MG/ML IJ SOLN: 0.5000 mg | Freq: Three times a day (TID) | INTRAMUSCULAR | Status: DC | PRN

## 2015-05-03 MED ORDER — LIP MEDEX EX OINT
TOPICAL_OINTMENT | CUTANEOUS | Status: AC
  Filled 2015-05-03: qty 7

## 2015-05-03 MED ORDER — INFLUENZA VAC SPLIT QUAD 0.5 ML IM SUSY
0.5000 mL | PREFILLED_SYRINGE | INTRAMUSCULAR | Status: DC
  Filled 2015-05-03 (×2): qty 0.5

## 2015-05-03 MED ORDER — ONDANSETRON HCL 4 MG/2ML IJ SOLN
4.0000 mg | Freq: Four times a day (QID) | INTRAMUSCULAR | Status: DC | PRN
  Administered 2015-05-03 – 2015-05-04 (×3): 4 mg via INTRAVENOUS
  Filled 2015-05-03 (×4): qty 2

## 2015-05-03 MED ORDER — EPHEDRINE SULFATE 50 MG/ML IJ SOLN
INTRAMUSCULAR | Status: AC
  Filled 2015-05-03: qty 1

## 2015-05-03 MED ORDER — SODIUM CHLORIDE 0.9 % IJ SOLN: 3.0000 mL | INTRAMUSCULAR | Status: DC | PRN

## 2015-05-03 MED ORDER — SODIUM CHLORIDE 0.9 % IJ SOLN
INTRAMUSCULAR | Status: AC
  Filled 2015-05-03: qty 10

## 2015-05-03 MED ORDER — GLYCOPYRROLATE 0.2 MG/ML IJ SOLN
INTRAMUSCULAR | Status: DC | PRN
  Administered 2015-05-03: 0.2 mg via INTRAVENOUS

## 2015-05-03 MED ORDER — ENOXAPARIN SODIUM 40 MG/0.4ML ~~LOC~~ SOLN
40.0000 mg | SUBCUTANEOUS | Status: DC
  Administered 2015-05-04: 40 mg via SUBCUTANEOUS
  Filled 2015-05-03 (×2): qty 0.4

## 2015-05-03 MED ORDER — ROCURONIUM BROMIDE 100 MG/10ML IV SOLN
INTRAVENOUS | Status: DC | PRN
  Administered 2015-05-03: 20 mg via INTRAVENOUS
  Administered 2015-05-03: 10 mg via INTRAVENOUS
  Administered 2015-05-03: 5 mg via INTRAVENOUS
  Administered 2015-05-03: 20 mg via INTRAVENOUS
  Administered 2015-05-03 (×2): 10 mg via INTRAVENOUS
  Administered 2015-05-03: 35 mg via INTRAVENOUS

## 2015-05-03 MED ORDER — EPHEDRINE SULFATE 50 MG/ML IJ SOLN
INTRAMUSCULAR | Status: DC | PRN
  Administered 2015-05-03 (×2): 5 mg via INTRAVENOUS

## 2015-05-03 MED ORDER — ROCURONIUM BROMIDE 100 MG/10ML IV SOLN
INTRAVENOUS | Status: AC
  Filled 2015-05-03: qty 1

## 2015-05-03 MED ORDER — INSULIN ASPART 100 UNIT/ML ~~LOC~~ SOLN: 0.0000 [IU] | SUBCUTANEOUS | Status: DC

## 2015-05-03 MED ORDER — PROPOFOL 10 MG/ML IV BOLUS
INTRAVENOUS | Status: DC | PRN
  Administered 2015-05-03: 150 mg via INTRAVENOUS

## 2015-05-03 MED ORDER — HYDROMORPHONE HCL 1 MG/ML IJ SOLN: 0.2500 mg | INTRAMUSCULAR | Status: DC | PRN

## 2015-05-03 MED ORDER — LORATADINE 10 MG PO TABS
10.0000 mg | ORAL_TABLET | Freq: Every day | ORAL | Status: DC
  Administered 2015-05-04: 10 mg via ORAL
  Filled 2015-05-03: qty 1

## 2015-05-03 MED ORDER — CEFAZOLIN SODIUM-DEXTROSE 2-3 GM-% IV SOLR
INTRAVENOUS | Status: AC
  Filled 2015-05-03: qty 50

## 2015-05-03 MED ORDER — LIDOCAINE HCL (CARDIAC) 20 MG/ML IV SOLN
INTRAVENOUS | Status: AC
  Filled 2015-05-03: qty 10

## 2015-05-03 MED ORDER — PHENYLEPHRINE 40 MCG/ML (10ML) SYRINGE FOR IV PUSH (FOR BLOOD PRESSURE SUPPORT)
PREFILLED_SYRINGE | INTRAVENOUS | Status: AC
  Filled 2015-05-03: qty 10

## 2015-05-03 MED ORDER — INSULIN ASPART 100 UNIT/ML ~~LOC~~ SOLN
0.0000 [IU] | Freq: Three times a day (TID) | SUBCUTANEOUS | Status: DC
  Administered 2015-05-03: 3 [IU] via SUBCUTANEOUS

## 2015-05-03 MED ORDER — LINACLOTIDE 290 MCG PO CAPS
290.0000 ug | ORAL_CAPSULE | Freq: Every day | ORAL | Status: DC
  Filled 2015-05-03 (×2): qty 1

## 2015-05-03 MED ORDER — BUPIVACAINE-EPINEPHRINE 0.25% -1:200000 IJ SOLN
INTRAMUSCULAR | Status: DC | PRN
  Administered 2015-05-03: 50 mL

## 2015-05-03 MED ORDER — ONDANSETRON HCL 4 MG/2ML IJ SOLN
INTRAMUSCULAR | Status: DC | PRN
  Administered 2015-05-03: 4 mg via INTRAVENOUS

## 2015-05-03 MED ORDER — ONDANSETRON 4 MG PO TBDP: 4.0000 mg | ORAL_TABLET | Freq: Four times a day (QID) | ORAL | Status: DC | PRN

## 2015-05-03 MED ORDER — ARTIFICIAL TEARS OP OINT
TOPICAL_OINTMENT | OPHTHALMIC | Status: AC
  Filled 2015-05-03: qty 3.5

## 2015-05-03 MED ORDER — ACETAMINOPHEN 500 MG PO TABS
1000.0000 mg | ORAL_TABLET | Freq: Three times a day (TID) | ORAL | Status: DC
  Administered 2015-05-04: 1000 mg via ORAL
  Filled 2015-05-03 (×3): qty 2

## 2015-05-03 MED ORDER — SODIUM CHLORIDE 0.9 % IV SOLN: 250.0000 mL | INTRAVENOUS | Status: DC | PRN

## 2015-05-03 MED ORDER — CEFAZOLIN SODIUM-DEXTROSE 2-3 GM-% IV SOLR
2.0000 g | INTRAVENOUS | Status: AC
Start: 2015-05-03 — End: 2015-05-03
  Administered 2015-05-03: 2 g via INTRAVENOUS

## 2015-05-03 MED ORDER — GLYCOPYRROLATE 0.2 MG/ML IJ SOLN
INTRAMUSCULAR | Status: AC
  Filled 2015-05-03: qty 2

## 2015-05-03 MED ORDER — PHENYLEPHRINE HCL 10 MG/ML IJ SOLN
10.0000 mg | INTRAVENOUS | Status: DC | PRN
  Administered 2015-05-03: 20 ug/min via INTRAVENOUS

## 2015-05-03 MED ORDER — CALCIUM CARBONATE ANTACID 500 MG PO CHEW: 1.0000 | CHEWABLE_TABLET | Freq: Every day | ORAL | Status: DC | PRN

## 2015-05-03 MED ORDER — HYDROMORPHONE HCL 1 MG/ML IJ SOLN
INTRAMUSCULAR | Status: DC | PRN
  Administered 2015-05-03: .4 mg via INTRAVENOUS

## 2015-05-03 MED ORDER — GLYCOPYRROLATE 0.2 MG/ML IJ SOLN
INTRAMUSCULAR | Status: AC
  Filled 2015-05-03: qty 1

## 2015-05-03 MED ORDER — FENTANYL CITRATE (PF) 250 MCG/5ML IJ SOLN
INTRAMUSCULAR | Status: AC
  Filled 2015-05-03: qty 25

## 2015-05-03 MED ORDER — PROPOFOL 10 MG/ML IV BOLUS
INTRAVENOUS | Status: AC
  Filled 2015-05-03: qty 20

## 2015-05-03 MED ORDER — CEFAZOLIN SODIUM-DEXTROSE 2-3 GM-% IV SOLR
2.0000 g | Freq: Three times a day (TID) | INTRAVENOUS | Status: DC
  Administered 2015-05-03: 2 g via INTRAVENOUS
  Filled 2015-05-03 (×2): qty 50

## 2015-05-03 MED ORDER — ATORVASTATIN CALCIUM 20 MG PO TABS
20.0000 mg | ORAL_TABLET | Freq: Every day | ORAL | Status: DC
  Administered 2015-05-04: 20 mg via ORAL
  Filled 2015-05-03 (×2): qty 1

## 2015-05-03 MED ORDER — PROMETHAZINE HCL 25 MG/ML IJ SOLN
INTRAMUSCULAR | Status: AC
Start: 2015-05-03 — End: 2015-05-03
  Filled 2015-05-03: qty 1

## 2015-05-03 MED ORDER — LIDOCAINE HCL (CARDIAC) 20 MG/ML IV SOLN
INTRAVENOUS | Status: AC
  Filled 2015-05-03: qty 5

## 2015-05-03 MED ORDER — METOCLOPRAMIDE HCL 5 MG/ML IJ SOLN: 5.0000 mg | Freq: Four times a day (QID) | INTRAMUSCULAR | Status: DC | PRN

## 2015-05-03 MED ORDER — PROMETHAZINE HCL 25 MG/ML IJ SOLN
6.2500 mg | INTRAMUSCULAR | Status: DC | PRN
  Administered 2015-05-04: 6.25 mg via INTRAVENOUS
  Filled 2015-05-03: qty 1

## 2015-05-03 MED ORDER — PROMETHAZINE HCL 25 MG RE SUPP: 25.0000 mg | Freq: Four times a day (QID) | RECTAL | Status: DC | PRN

## 2015-05-03 MED ORDER — MENTHOL 3 MG MT LOZG
1.0000 | LOZENGE | OROMUCOSAL | Status: DC | PRN
  Filled 2015-05-03: qty 9

## 2015-05-03 MED ORDER — FENTANYL CITRATE (PF) 100 MCG/2ML IJ SOLN
INTRAMUSCULAR | Status: DC | PRN
  Administered 2015-05-03: 100 ug via INTRAVENOUS
  Administered 2015-05-03 (×3): 50 ug via INTRAVENOUS

## 2015-05-03 MED ORDER — BUPIVACAINE-EPINEPHRINE 0.25% -1:200000 IJ SOLN
INTRAMUSCULAR | Status: AC
  Filled 2015-05-03: qty 1

## 2015-05-03 MED ORDER — ONDANSETRON HCL 4 MG/2ML IJ SOLN
INTRAMUSCULAR | Status: AC
  Filled 2015-05-03: qty 2

## 2015-05-03 MED ORDER — VITAMIN D 1000 UNITS PO TABS
2000.0000 [IU] | ORAL_TABLET | Freq: Every day | ORAL | Status: DC
Start: 2015-05-03 — End: 2015-05-04
  Administered 2015-05-04: 2000 [IU] via ORAL
  Filled 2015-05-03 (×2): qty 2

## 2015-05-03 MED ORDER — MAGIC MOUTHWASH
15.0000 mL | Freq: Four times a day (QID) | ORAL | Status: DC | PRN
  Filled 2015-05-03: qty 15

## 2015-05-03 MED ORDER — CHLORHEXIDINE GLUCONATE 4 % EX LIQD: 1.0000 "application " | Freq: Once | CUTANEOUS | Status: DC

## 2015-05-03 MED ORDER — LACTATED RINGERS IR SOLN
Status: DC | PRN
  Administered 2015-05-03: 1000 mL

## 2015-05-03 MED ORDER — PHENYLEPHRINE HCL 10 MG/ML IJ SOLN
INTRAMUSCULAR | Status: DC | PRN
  Administered 2015-05-03 (×4): 80 ug via INTRAVENOUS
  Administered 2015-05-03: 40 ug via INTRAVENOUS
  Administered 2015-05-03 (×3): 80 ug via INTRAVENOUS

## 2015-05-03 MED ORDER — SUGAMMADEX SODIUM 200 MG/2ML IV SOLN
INTRAVENOUS | Status: DC | PRN
  Administered 2015-05-03: 400 mg via INTRAVENOUS

## 2015-05-03 MED ORDER — BUPIVACAINE-EPINEPHRINE (PF) 0.25% -1:200000 IJ SOLN
INTRAMUSCULAR | Status: AC
  Filled 2015-05-03: qty 30

## 2015-05-03 MED ORDER — SODIUM CHLORIDE 0.9 % IJ SOLN: 3.0000 mL | Freq: Two times a day (BID) | INTRAMUSCULAR | Status: DC

## 2015-05-03 MED ORDER — PROMETHAZINE HCL 12.5 MG PO TABS: 12.5000 mg | ORAL_TABLET | Freq: Four times a day (QID) | ORAL | Status: DC | PRN

## 2015-05-03 MED ORDER — PROPOFOL 10 MG/ML IV BOLUS
INTRAVENOUS | Status: AC
Start: 2015-05-03 — End: 2015-05-03
  Filled 2015-05-03: qty 20

## 2015-05-03 MED ORDER — STERILE WATER FOR IRRIGATION IR SOLN
Status: DC | PRN
  Administered 2015-05-03: 2000 mL

## 2015-05-03 MED ORDER — ALUM & MAG HYDROXIDE-SIMETH 200-200-20 MG/5ML PO SUSP: 30.0000 mL | Freq: Four times a day (QID) | ORAL | Status: DC | PRN

## 2015-05-03 MED ORDER — PROMETHAZINE HCL 25 MG/ML IJ SOLN
6.2500 mg | INTRAMUSCULAR | Status: DC | PRN
  Administered 2015-05-03: 12.5 mg via INTRAVENOUS

## 2015-05-03 MED ORDER — METOPROLOL TARTRATE 12.5 MG HALF TABLET
12.5000 mg | ORAL_TABLET | Freq: Two times a day (BID) | ORAL | Status: DC | PRN
  Filled 2015-05-03: qty 1

## 2015-05-03 MED ORDER — METRONIDAZOLE IN NACL 5-0.79 MG/ML-% IV SOLN
500.0000 mg | Freq: Four times a day (QID) | INTRAVENOUS | Status: DC
  Administered 2015-05-03 – 2015-05-04 (×2): 500 mg via INTRAVENOUS
  Filled 2015-05-03 (×3): qty 100

## 2015-05-03 MED ORDER — SUGAMMADEX SODIUM 500 MG/5ML IV SOLN
INTRAVENOUS | Status: AC
  Filled 2015-05-03: qty 5

## 2015-05-03 MED ORDER — LACTATED RINGERS IV BOLUS (SEPSIS): 1000.0000 mL | Freq: Three times a day (TID) | INTRAVENOUS | Status: DC | PRN

## 2015-05-03 MED ORDER — DEXAMETHASONE SODIUM PHOSPHATE 10 MG/ML IJ SOLN
INTRAMUSCULAR | Status: AC
  Filled 2015-05-03: qty 1

## 2015-05-03 MED ORDER — METRONIDAZOLE IN NACL 5-0.79 MG/ML-% IV SOLN
500.0000 mg | INTRAVENOUS | Status: AC
  Administered 2015-05-03: 500 mg via INTRAVENOUS

## 2015-05-03 MED ORDER — DIPHENHYDRAMINE HCL 50 MG/ML IJ SOLN: 12.5000 mg | Freq: Four times a day (QID) | INTRAMUSCULAR | Status: DC | PRN

## 2015-05-03 MED ORDER — INSULIN GLARGINE 100 UNIT/ML ~~LOC~~ SOLN
0.0000 [IU] | Freq: Every day | SUBCUTANEOUS | Status: DC
  Administered 2015-05-03: 20 [IU] via SUBCUTANEOUS
  Filled 2015-05-03 (×2): qty 0.2

## 2015-05-03 MED ORDER — LACTATED RINGERS IV SOLN
INTRAVENOUS | Status: DC | PRN
  Administered 2015-05-03 (×2): via INTRAVENOUS

## 2015-05-03 MED ORDER — METRONIDAZOLE IN NACL 5-0.79 MG/ML-% IV SOLN
INTRAVENOUS | Status: AC
  Filled 2015-05-03: qty 100

## 2015-05-03 MED ORDER — KCL IN DEXTROSE-NACL 20-5-0.45 MEQ/L-%-% IV SOLN
INTRAVENOUS | Status: DC
  Administered 2015-05-04: 01:00:00 via INTRAVENOUS
  Filled 2015-05-03 (×2): qty 1000

## 2015-05-03 MED ORDER — PHENOL 1.4 % MT LIQD
2.0000 | OROMUCOSAL | Status: DC | PRN
  Filled 2015-05-03: qty 177

## 2015-05-03 SURGICAL SUPPLY — 58 items
APPLIER CLIP 5 13 M/L LIGAMAX5 (MISCELLANEOUS)
APPLIER CLIP ROT 10 11.4 M/L (STAPLE)
BLADE SURG SZ11 CARB STEEL (BLADE) ×4 IMPLANT
CHLORAPREP W/TINT 26ML (MISCELLANEOUS) ×4 IMPLANT
CLIP APPLIE 5 13 M/L LIGAMAX5 (MISCELLANEOUS) IMPLANT
CLIP APPLIE ROT 10 11.4 M/L (STAPLE) IMPLANT
COVER SURGICAL LIGHT HANDLE (MISCELLANEOUS) IMPLANT
COVER TIP SHEARS 8 DVNC (MISCELLANEOUS) ×2 IMPLANT
COVER TIP SHEARS 8MM DA VINCI (MISCELLANEOUS) ×2
DECANTER SPIKE VIAL GLASS SM (MISCELLANEOUS) ×4 IMPLANT
DEVICE TROCAR PUNCTURE CLOSURE (ENDOMECHANICALS) IMPLANT
DRAIN PENROSE 18X1/2 LTX STRL (DRAIN) IMPLANT
DRAPE ARM DVNC X/XI (DISPOSABLE) ×8 IMPLANT
DRAPE COLUMN DVNC XI (DISPOSABLE) ×2 IMPLANT
DRAPE DA VINCI XI ARM (DISPOSABLE) ×8
DRAPE DA VINCI XI COLUMN (DISPOSABLE) ×2
DRSG TEGADERM 2-3/8X2-3/4 SM (GAUZE/BANDAGES/DRESSINGS) ×4 IMPLANT
ELECT REM PT RETURN 9FT ADLT (ELECTROSURGICAL) ×4
ELECTRODE REM PT RTRN 9FT ADLT (ELECTROSURGICAL) ×2 IMPLANT
ENDOLOOP SUT PDS II  0 18 (SUTURE)
ENDOLOOP SUT PDS II 0 18 (SUTURE) IMPLANT
GAUZE SPONGE 2X2 8PLY STRL LF (GAUZE/BANDAGES/DRESSINGS) ×2 IMPLANT
GLOVE BIO SURGEON STRL SZ 6 (GLOVE) ×4 IMPLANT
GLOVE BIOGEL PI IND STRL 6.5 (GLOVE) ×2 IMPLANT
GLOVE BIOGEL PI IND STRL 7.0 (GLOVE) ×12 IMPLANT
GLOVE BIOGEL PI INDICATOR 6.5 (GLOVE) ×2
GLOVE BIOGEL PI INDICATOR 7.0 (GLOVE) ×12
GLOVE ECLIPSE 8.0 STRL XLNG CF (GLOVE) ×8 IMPLANT
GLOVE INDICATOR 8.0 STRL GRN (GLOVE) ×8 IMPLANT
GOWN STRL REUS W/TWL XL LVL3 (GOWN DISPOSABLE) ×24 IMPLANT
KIT BASIN OR (CUSTOM PROCEDURE TRAY) ×4 IMPLANT
NEEDLE HYPO 22GX1.5 SAFETY (NEEDLE) ×4 IMPLANT
NEEDLE INSUFFLATION 14GA 120MM (NEEDLE) ×4 IMPLANT
PACK CARDIOVASCULAR III (CUSTOM PROCEDURE TRAY) ×4 IMPLANT
PAD POSITIONING PINK XL (MISCELLANEOUS) ×4 IMPLANT
PEN SKIN MARKING BROAD (MISCELLANEOUS) ×4 IMPLANT
SCISSORS LAP 5X35 DISP (ENDOMECHANICALS) ×4 IMPLANT
SEAL CANN UNIV 5-8 DVNC XI (MISCELLANEOUS) ×8 IMPLANT
SEAL XI 5MM-8MM UNIVERSAL (MISCELLANEOUS) ×8
SEALER VESSEL DA VINCI XI (MISCELLANEOUS) ×2
SEALER VESSEL EXT DVNC XI (MISCELLANEOUS) ×2 IMPLANT
SET IRRIG TUBING LAPAROSCOPIC (IRRIGATION / IRRIGATOR) ×4 IMPLANT
SLEEVE XCEL OPT CAN 5 100 (ENDOMECHANICALS) IMPLANT
SOLUTION ELECTROLUBE (MISCELLANEOUS) ×4 IMPLANT
SPONGE GAUZE 2X2 STER 10/PKG (GAUZE/BANDAGES/DRESSINGS) ×2
SPONGE LAP 18X18 X RAY DECT (DISPOSABLE) ×4 IMPLANT
SUT ETHIBOND 0 36 GRN (SUTURE) ×16 IMPLANT
SUT MNCRL AB 4-0 PS2 18 (SUTURE) ×4 IMPLANT
SUT VIC AB 4-0 SH 27 (SUTURE) ×2
SUT VIC AB 4-0 SH 27XBRD (SUTURE) ×2 IMPLANT
SYR 20CC LL (SYRINGE) ×4 IMPLANT
SYRINGE 10CC LL (SYRINGE) ×4 IMPLANT
TOWEL OR 17X26 10 PK STRL BLUE (TOWEL DISPOSABLE) ×4 IMPLANT
TOWEL OR NON WOVEN STRL DISP B (DISPOSABLE) ×4 IMPLANT
TRAY FOLEY W/METER SILVER 14FR (SET/KITS/TRAYS/PACK) ×4 IMPLANT
TRAY FOLEY W/METER SILVER 16FR (SET/KITS/TRAYS/PACK) IMPLANT
TROCAR BLADELESS OPT 5 100 (ENDOMECHANICALS) ×4 IMPLANT
TUBING FILTER THERMOFLATOR (ELECTROSURGICAL) ×4 IMPLANT

## 2015-05-03 NOTE — Transfer of Care (Signed)
Immediate Anesthesia Transfer of Care Note  Patient: Melinda Drake  Procedure(s) Performed: Procedure(s): XI ROBOTIC ASSISTED LAPAROSCOPIC HELLER CARDIOMYOTOMY AND PARTIAL TOUPET FUNDOPLICATION (N/A) LAPAROSCOPIC LYSIS OF ADHESIONS  Patient Location: PACU  Anesthesia Type:General  Level of Consciousness:  sedated, patient cooperative and responds to stimulation  Airway & Oxygen Therapy:Patient Spontanous Breathing and Patient connected to face mask oxgen  Post-op Assessment:  Report given to PACU RN and Post -op Vital signs reviewed and stable  Post vital signs:  Reviewed and stable  Last Vitals:  Filed Vitals:   05/03/15 0533  BP: 147/68  Pulse: 81  Temp: 36.6 C  Resp: 18    Complications: No apparent anesthesia complications

## 2015-05-03 NOTE — Discharge Instructions (Signed)
EATING AFTER YOUR ESOPHAGEAL SURGERY (Stomach Fundoplication, Hiatal Hernia repair, Achalasia surgery, etc)  After your esophageal surgery, expect some sticking with swallowing over the next 1-2 months.    If food sticks when you eat, it is called "dysphagia".  This is due to swelling around your esophagus at the wrap & hiatal diaphragm repair.  It will gradually ease off over the next few months.  To help you through this temporary phase, we start you out on a pureed (blenderized) diet.  Your first meal in the hospital was thin liquids.  You should have been given a pureed diet by the time you left the hospital.  We ask patients to stay on a pureed diet for the first 2-3 weeks to avoid anything getting "stuck" near your recent surgery.  Don't be alarmed if your ability to swallow doesn't progress according to this plan.  Everyone is different and some diets can advance more or less quickly.     Some BASIC RULES to follow are:  Maintain an upright position whenever eating or drinking.  Take small bites - just a teaspoon size bite at a time.  Eat slowly.  It may also help to eat only one food at a time.  Consider nibbling through smaller, more frequent meals & avoid the urge to eat BIG meals  Do not push through feelings of fullness, nausea, or bloatedness  Do not mix solid foods and liquids in the same mouthful  Try not to "wash foods down" with large gulps of liquids.  Avoid carbonated (bubbly/fizzy) drinks.    Avoid foods that make you feel gassy or bloated.  Start with bland foods first.  Wait on trying greasy, fried, or spicy meals until you are tolerating more bland solids well.  Understand that it will be hard to burp and belch at first.  This gradually improves with time.  Expect to be more gassy/flatulent/bloated initially.  Walking will help your body manage it better.  Consider using medications for bloating that contain simethicone such as  Maalox or Gas-X   Eat in a  relaxed atmosphere & minimize distractions.  Avoid talking while eating.    Do not use straws.  Following each meal, sit in an upright position (90 degree angle) for 60 to 90 minutes.  Going for a short walk can help as well  If food does stick, don't panic.  Try to relax and let the food pass on its own.  Sipping WARM LIQUID such as strong hot black tea can also help slide it down.   Be gradual in changes & use common sense:  -If you easily tolerating a certain "level" of foods, advance to the next level gradually -If you are having trouble swallowing a particular food, then avoid it.   -If food is sticking when you advance your diet, go back to thinner previous diet (the lower LEVEL) for 1-2 days.  LEVEL 1 = PUREED DIET  Do for the first 2 WEEKS AFTER SURGERY  -Foods in this group are pureed or blenderized to a smooth, mashed potato-like consistency.  -If necessary, the pureed foods can keep their shape with the addition of a thickening agent.   -Meat should be pureed to a smooth, pasty consistency.  Hot broth or gravy may be added to the pureed meat, approximately 1 oz. of liquid per 3 oz. serving of meat. -CAUTION:  If any foods do not puree into a smooth consistency, swallowing will be more difficult.  (For example, nuts  or seeds sometimes do not blend well.)  Hot Foods Cold Foods  Pureed scrambled eggs and cheese Pureed cottage cheese  Baby cereals Thickened juices and nectars  Thinned cooked cereals (no lumps) Thickened milk or eggnog  Pureed [place] toast or pancakes Ensure  Mashed potatoes Ice cream  Pureed parsley, au gratin, scalloped potatoes, candied sweet potatoes Fruit or [place] ice, sherbet  Pureed buttered or alfredo noodles Plain yogurt  Pureed vegetables (no corn or peas) Instant breakfast  Pureed soups and creamed soups Smooth pudding, mousse, custard  Pureed scalloped apples Whipped gelatin  Gravies Sugar, syrup, honey, jelly  Sauces, cheese, tomato,  barbecue, white, creamed Cream  Any baby food Creamer  Alcohol in moderation (not beer or champagne) Margarine  Coffee or tea Mayonnaise   Ketchup, mustard   Apple sauce   SAMPLE MENU:  PUREED DIET Breakfast Lunch Dinner   Orange juice, 1/2 cup  Cream of wheat, 1/2 cup  Pineapple juice, 1/2 cup  Pureed [place], barley soup, 3/4 cup  Pureed Hawaiian chicken, 3 oz   Scrambled eggs, mashed or blended with cheese, 1/2 cup  Tea or coffee, 1 cup   Whole milk, 1 cup   Non-dairy creamer, 2 Tbsp.  Mashed potatoes, 1/2 cup  Pureed cooled broccoli, 1/2 cup  Apple sauce, 1/2 cup  Coffee or tea  Mashed potatoes, 1/2 cup  Pureed spinach, 1/2 cup  Frozen yogurt, 1/2 cup  Tea or coffee      LEVEL 2 = SOFT DIET  After your first 2 weeks, you can advance to a soft diet.   Keep on this diet until everything goes down easily.  Hot Foods Cold Foods  White fish Cottage cheese  Stuffed fish Junior baby fruit  Baby food meals Semi thickened juices  Minced soft cooked, scrambled, poached eggs nectars  Souffle & omelets Ripe mashed bananas  Cooked cereals Canned fruit, pineapple sauce, milk  potatoes Milkshake  Buttered or Alfredo noodles Custard  Cooked cooled vegetable Puddings, including tapioca  Sherbet Yogurt  Vegetable soup or alphabet soup Fruit ice, [place] ice  Gravies Whipped gelatin  Sugar, syrup, honey, jelly Junior baby desserts  Sauces:  Cheese, creamed, barbecue, tomato, white Cream  Coffee or tea Margarine   SAMPLE MENU:  LEVEL 2 Breakfast Lunch Dinner   Orange juice, 1/2 cup  Oatmeal, 1/2 cup  Scrambled eggs with cheese, 1/2 cup  Decaffeinated tea, 1 cup  Whole milk, 1 cup  Non-dairy creamer, 2 Tbsp  Pineapple juice, 1/2 cup  Minced beef, 3 oz  Gravy, 2 Tbsp  Mashed potatoes, 1/2 cup  Minced fresh broccoli, 1/2 cup  Applesauce, 1/2 cup  Coffee, 1 cup  [place], barley soup, 3/4 cup  Minced Hawaiian chicken, 3 oz  Mashed potatoes, 1/2  cup  Cooked spinach, 1/2 cup  Frozen yogurt, 1/2 cup  Non-dairy creamer, 2 Tbsp      LEVEL 3 = CHOPPED DIET  -After all the foods in level 2 (soft diet) are passing through well you should advance up to more chopped foods.  -It is still important to cut these foods into small pieces and eat slowly.  Hot Foods Cold Foods  Poultry Cottage cheese  Chopped Swedish meatballs Yogurt  Meat salads (ground or flaked meat) Milk  Flaked fish (tuna) Milkshakes  Poached or scrambled eggs Soft, cold, dry cereal  Souffles and omelets Fruit juices or nectars  Cooked cereals Chopped canned fruit  Chopped [place] toast or pancakes Canned fruit cocktail  Noodles or  pasta (no rice) Pudding, mousse, custard  Cooked vegetables (no frozen peas, corn, or mixed vegetables) Green salad  Canned small sweet peas Ice cream  Creamed soup or vegetable soup Fruit ice, [place] ice  Pureed vegetable soup or alphabet soup Non-dairy creamer  Ground scalloped apples Margarine  Gravies Mayonnaise  Sauces:  Cheese, creamed, barbecue, tomato, white Ketchup  Coffee or tea Mustard   SAMPLE MENU:  LEVEL 3 Breakfast Lunch Dinner   Orange juice, 1/2 cup  Oatmeal, 1/2 cup  Scrambled eggs with cheese, 1/2 cup  Decaffeinated tea, 1 cup  Whole milk, 1 cup  Non-dairy creamer, 2 Tbsp  Ketchup, 1 Tbsp  Margarine, 1 tsp  Salt, 1/4 tsp  Sugar, 2 tsp  Pineapple juice, 1/2 cup  Ground beef, 3 oz  Gravy, 2 Tbsp  Mashed potatoes, 1/2 cup  Cooked spinach, 1/2 cup  Applesauce, 1/2 cup  Decaffeinated coffee  Whole milk  Non-dairy creamer, 2 Tbsp  Margarine, 1 tsp  Salt, 1/4 tsp  Pureed [place], barley soup, 3/4 cup  Barbecue chicken, 3 oz  Mashed potatoes, 1/2 cup  Ground fresh broccoli, 1/2 cup  Frozen yogurt, 1/2 cup  Decaffeinated tea, 1 cup  Non-dairy creamer, 2 Tbsp  Margarine, 1 tsp  Salt, 1/4 tsp  Sugar, 1 tsp    LEVEL 4:  REGULAR FOODS  -Foods in this group are soft,  moist, regularly textured foods.   -This level includes meat and breads, which tend to be the hardest things to swallow.   -Eat very slowly, chew well and continue to avoid carbonated drinks. -most people are at this level in 4-6 weeks  Hot Foods Cold Foods  Baked fish or skinned Soft cheeses - cottage cheese  Souffles and omelets Cream cheese  Eggs Yogurt  Stuffed shells Milk  Spaghetti with meat sauce Milkshakes  Cooked cereal Cold dry cereals (no nuts, dried fruit, coconut)  [place] toast or pancakes Crackers  Buttered toast Fruit juices or nectars  Noodles or pasta (no rice) Canned fruit  Potatoes (all types) Ripe bananas  Soft, cooked vegetables (no corn, lima, or baked beans) Peeled, ripe, fresh fruit  Creamed soups or vegetable soup Cakes (no nuts, dried fruit, coconut)  Canned chicken noodle soup Plain doughnuts  Gravies Ice cream  Bacon dressing Pudding, mousse, custard  Sauces:  Cheese, creamed, barbecue, tomato, white Fruit ice, [place] ice, sherbet  Decaffeinated tea or coffee Whipped gelatin  Pork chops Regular gelatin   Canned fruited gelatin molds   Sugar, syrup, honey, jam, jelly   Cream   Non-dairy   Margarine   Oil   Mayonnaise   Ketchup   Mustard   TROUBLESHOOTING IRREGULAR BOWELS  1) Avoid extremes of bowel movements (no bad constipation/diarrhea)  2) Miralax 17gm mixed in 8oz. water or juice-daily. May use BID as needed.  3) Gas-x,Phazyme, etc. as needed for gas & bloating.  4) Soft,bland diet. No spicy,greasy,fried foods.  5) Prilosec over-the-counter as needed  6) May hold gluten/wheat products from diet to see if symptoms improve.  7) May try probiotics (Align, Activa, etc) to help calm the bowels down  7) If symptoms become worse call back immediately.    If you have any questions please call our office at [place]: [phone].  LAPAROSCOPIC SURGERY: POST OP INSTRUCTIONS  1. DIET: Follow a light bland diet the first 24 hours  after arrival home, such as soup, liquids, crackers, etc.  Be sure to include lots of fluids daily.  Avoid fast  food or heavy meals as your are more likely to get nauseated.  Eat a low fat the next few days after surgery.   2. Take your usually prescribed home medications unless otherwise directed. 3. PAIN CONTROL: a. Pain is best controlled by a usual combination of three different methods TOGETHER: i. Ice/Heat ii. Over the counter pain medication iii. Prescription pain medication b. Most patients will experience some swelling and bruising around the incisions.  Ice packs or heating pads (30-60 minutes up to 6 times a day) will help. Use ice for the first few days to help decrease swelling and bruising, then switch to heat to help relax tight/sore spots and speed recovery.  Some people prefer to use ice alone, heat alone, alternating between ice & heat.  Experiment to what works for you.  Swelling and bruising can take several weeks to resolve.   c. It is helpful to take an over-the-counter pain medication regularly for the first few weeks.  Choose one of the following that works best for you: i. Naproxen (Aleve, etc)  Two 220mg  tabs twice a day ii. Ibuprofen (Advil, etc) Three 200mg  tabs four times a day (every meal & bedtime) iii. Acetaminophen (Tylenol, etc) 500-650mg  four times a day (every meal & bedtime) d. A  prescription for pain medication (such as oxycodone, hydrocodone, etc) should be given to you upon discharge.  Take your pain medication as prescribed.  i. If you are having problems/concerns with the prescription medicine (does not control pain, nausea, vomiting, rash, itching, etc), please call us [phone] to see if we need to switch you to a different pain medicine that will work better for you and/or control your side effect better. ii. If you need a refill on your pain medication, please contact your pharmacy.  They will contact our office to request authorization. Prescriptions  will not be filled after 5 pm or on week-ends. 4. Avoid getting constipated.  Between the surgery and the pain medications, it is common to experience some constipation.  Increasing fluid intake and taking a fiber supplement (such as Metamucil, Citrucel, FiberCon, MiraLax, etc) 1-2 times a day regularly will usually help prevent this problem from occurring.  A mild laxative (prune juice, Milk of Magnesia, MiraLax, etc) should be taken according to package directions if there are no bowel movements after 48 hours.   5. Watch out for diarrhea.  If you have many loose bowel movements, simplify your diet to bland foods & liquids for a few days.  Stop any stool softeners and decrease your fiber supplement.  Switching to mild anti-diarrheal medications (Kayopectate, Pepto Bismol) can help.  If this worsens or does not improve, please call us. 6. Wash / shower every day.  You may shower over the dressings as they are waterproof.  Continue to shower over incision(s) after the dressing is off. 7. Remove your waterproof bandages 5 days after surgery.  You may leave the incision open to air.  You may replace a dressing/Band-Aid to cover the incision for comfort if you wish.  8. ACTIVITIES as tolerated:   a. You may resume regular (light) daily activities beginning the next day--such as daily self-care, walking, climbing stairs--gradually increasing activities as tolerated.  If you can walk 30 minutes without difficulty, it is safe to try more intense activity such as jogging, treadmill, bicycling, low-impact aerobics, swimming, etc. b. Save the most intensive and strenuous activity for last such as sit-ups, heavy lifting, contact sports, etc  Refrain from any  heavy lifting or straining until you are off narcotics for pain control.   c. DO NOT PUSH THROUGH PAIN.  Let pain be your guide: If it hurts to do something, don't do it.  Pain is your body warning you to avoid that activity for another week until the pain goes  down. d. You may drive when you are no longer taking prescription pain medication, you can comfortably wear a seatbelt, and you can safely maneuver your car and apply brakes. e. Dennis Bast may have sexual intercourse when it is comfortable.  9. FOLLOW UP in our office a. Please call [facility] at [phone] to set up an appointment to see your surgeon in the office for a follow-up appointment approximately 2-3 weeks after your surgery. b. Make sure that you call for this appointment the day you arrive home to insure a convenient appointment time. 10. IF YOU HAVE DISABILITY OR FAMILY LEAVE FORMS, BRING THEM TO THE OFFICE FOR PROCESSING.  DO NOT GIVE THEM TO YOUR DOCTOR.   WHEN TO CALL us [phone]: 1. Poor pain control 2. Reactions / problems with new medications (rash/itching, nausea, etc)  3. Fever over 101.5 F (38.5 C) 4. Inability to urinate 5. Nausea and/or vomiting 6. Worsening swelling or bruising 7. Continued bleeding from incision. 8. Increased pain, redness, or drainage from the incision   The clinic staff is available to answer your questions during regular business hours (8:30am-5pm).  Please dont hesitate to call and ask to speak to one of our nurses for clinical concerns.   If you have a medical emergency, go to the nearest emergency room or call 911.  A surgeon from [facility] is always on call at the [facility], [place], [place], [place], [place]  [id number] ? MAIN: [phone] ? TOLL FREE: [phone] ?  FAX [phone] [email or web address]  GETTING TO GOOD BOWEL HEALTH. Irregular bowel habits such as constipation and diarrhea can lead to many problems over time.  Having one soft bowel movement a day is the most important way to prevent further problems.  The anorectal canal is designed to handle stretching and feces to safely manage our ability to get rid of solid waste (feces, poop, stool) out of  our body.  BUT, hard constipated stools can act like ripping concrete bricks and diarrhea can be a burning fire to this very sensitive area of our body, causing inflamed hemorrhoids, anal fissures, increasing risk is perirectal abscesses, abdominal pain/bloating, an making irritable bowel worse.      The goal: ONE SOFT BOWEL MOVEMENT A DAY!  To have soft, regular bowel movements:   Drink plenty of fluids, consider 4-6 tall glasses of water a day.    Take plenty of fiber.  Fiber is the undigested part of plant food that passes into the colon, acting s natures broom to encourage bowel motility and movement.  Fiber can absorb and hold large amounts of water. This results in a larger, bulkier stool, which is soft and easier to pass. Work gradually over several weeks up to 6 servings a day of fiber (25g a day even more if needed) in the form of: o Vegetables -- Root (potatoes, carrots, turnips), leafy green (lettuce, salad greens, celery, spinach), or cooked high residue (cabbage, broccoli, etc) o Fruit -- Fresh (unpeeled skin & pulp), Dried (prunes, apricots, cherries, etc ),  or stewed ( applesauce)  o Whole grain breads, pasta, etc (whole wheat)  o Bran cereals   Bulking Agents -- This type of water-retaining fiber generally is easily obtained each day by one of the following:  o Psyllium bran -- The psyllium plant is remarkable because its ground seeds can retain so much water. This product is available as Metamucil, Konsyl, Effersyllium, Per Diem Fiber, or the less expensive generic preparation in drug and health food stores. Although labeled a laxative, it really is not a laxative.  o Methylcellulose -- This is another fiber derived from wood which also retains water. It is available as Citrucel. o Polyethylene Glycol - and artificial fiber commonly called Miralax or Glycolax.  It is helpful for people with gassy or bloated feelings with regular fiber o Flax Seed - a less gassy fiber than  psyllium  No reading or other relaxing activity while on the toilet. If bowel movements take longer than 5 minutes, you are too constipated  AVOID CONSTIPATION.  High fiber and water intake usually takes care of this.  Sometimes a laxative is needed to stimulate more frequent bowel movements, but   Laxatives are not a good long-term solution as it can wear the colon out.  They can help jump-start bowels if constipated, but should be relied on constantly without discussing with your doctor o Osmotics (Milk of Magnesia, Fleets phosphosoda, Magnesium citrate, MiraLax, GoLytely) are safer than  o Stimulants (Senokot, Castor Oil, Dulcolax, Ex Lax)    o Avoid taking laxatives for more than 7 days in a row.   IF SEVERELY CONSTIPATED, try a Bowel Retraining Program: o Do not use laxatives.  o Eat a diet high in roughage, such as bran cereals and leafy vegetables.  o Drink six (6) ounces of prune or apricot juice each morning.  o Eat two (2) large servings of stewed fruit each day.  o Take one (1) heaping tablespoon of a psyllium-based bulking agent twice a day. Use sugar-free sweetener when possible to avoid excessive calories.  o Eat a normal breakfast.  o Set aside 15 minutes after breakfast to sit on the toilet, but do not strain to have a bowel movement.  o If you do not have a bowel movement by the third day, use an enema and repeat the above steps.   Controlling diarrhea o Switch to liquids and simpler foods for a few days to avoid stressing your intestines further. o Avoid dairy products (especially milk & ice cream) for a short time.  The intestines often can lose the ability to digest lactose when stressed. o Avoid foods that cause gassiness or bloating.  Typical foods include beans and other legumes, cabbage, broccoli, and dairy foods.  Every person has some sensitivity to other foods, so listen to our body and avoid those foods that trigger problems for you. o Adding fiber (Citrucel,  Metamucil, psyllium, Miralax) gradually can help thicken stools by absorbing excess fluid and retrain the intestines to act more normally.  Slowly increase the dose over a few weeks.  Too much fiber too soon can backfire and cause cramping & bloating. o Probiotics (such as active yogurt, Align, etc) may help repopulate the intestines and colon with normal bacteria and calm down a sensitive digestive tract.  Most studies show it to be of mild help, though, and such products can be costly. o Medicines: - Bismuth subsalicylate (ex. Kayopectate, Pepto Bismol) every 30 minutes for up to 6 doses can help control diarrhea.  Avoid if pregnant. - Loperamide (Immodium) can slow down diarrhea.  Start with  two tablets (4mg  total) first and then try one tablet every 6 hours.  Avoid if you are having fevers or severe pain.  If you are not better or start feeling worse, stop all medicines and call your doctor for advice o Call your doctor if you are getting worse or not better.  Sometimes further testing (cultures, endoscopy, X-ray studies, bloodwork, etc) may be needed to help diagnose and treat the cause of the diarrhea.  TROUBLESHOOTING IRREGULAR BOWELS 1) Avoid extremes of bowel movements (no bad constipation/diarrhea) 2) Miralax 17gm mixed in 8oz. water or juice-daily. May use BID as needed.  3) Gas-x,Phazyme, etc. as needed for gas & bloating.  4) Soft,bland diet. No spicy,greasy,fried foods.  5) Prilosec over-the-counter as needed  6) May hold gluten/wheat products from diet to see if symptoms improve.  7)  May try probiotics (Align, Activa, etc) to help calm the bowels down 7) If symptoms become worse call back immediately.  Managing Pain  Pain after surgery or related to activity is often due to strain/injury to muscle, tendon, nerves and/or incisions.  This pain is usually short-term and will improve in a few months.   Many people find it helpful to do the following things TOGETHER to help speed the  process of healing and to get back to regular activity more quickly:  1. Avoid heavy physical activity at first a. No lifting greater than 20 pounds at first, then increase to lifting as tolerated over the next few weeks b. Do not push through the pain.  Listen to your body and avoid positions and maneuvers than reproduce the pain.  Wait a few days before trying something more intense c. Walking is okay as tolerated, but go slowly and stop when getting sore.  If you can walk 30 minutes without stopping or pain, you can try more intense activity (running, jogging, aerobics, cycling, swimming, treadmill, sex, sports, weightlifting, etc ) d. Remember: If it hurts to do it, then dont do it!  2. Take Anti-inflammatory medication a. Choose ONE of the following over-the-counter medications: i.            Acetaminophen 500mg  tabs (Tylenol) 1-2 pills with every meal and just before bedtime (avoid if you have liver problems) ii.            Naproxen 220mg  tabs (ex. Aleve) 1-2 pills twice a day (avoid if you have kidney, stomach, IBD, or bleeding problems) iii. Ibuprofen 200mg  tabs (ex. Advil, Motrin) 3-4 pills with every meal and just before bedtime (avoid if you have kidney, stomach, IBD, or bleeding problems) b. Take with food/snack around the clock for 1-2 weeks i. This helps the muscle and nerve tissues become less irritable and calm down faster  3. Use a Heating pad or Ice/Cold Pack a. 4-6 times a day b. May use warm bath/hottub  or showers  4. Try Gentle Massage and/or Stretching  a. at the area of pain many times a day b. stop if you feel pain - do not overdo it  Try these steps together to help you body heal faster and avoid making things get worse.  Doing just one of these things may not be enough.    If you are not getting better after two weeks or are noticing you are getting worse, contact our office for further advice; we may need to re-evaluate you & see what other things we can do to  help.  Achalasia Achalasia is a condition in which a person cannot  get food through the lower esophagus. This is the tube that carries food from your mouth to your stomach. This happens because there are no nerves to the lower esophagus and the esophageal sphincter. This is the circular muscle between the stomach and esophagus that relaxes to allow food into the stomach. It then contracts to keep food in the stomach. This absence of nerves may be present since birth. This condition causes difficulty swallowing, chest pain, and food coming back up in the mouth after being swallowed. DIAGNOSIS  This condition is diagnosed by x-ray, pressure studies in the esophagus, and endoscopy. This is when your caregiver looks into your esophagus with a small flexible telescope. The portion of the esophagus above the narrowing is usually enlarged when the condition is present. TREATMENT   Soft diets are helpful.  Medications will help the food pass more easily into the stomach.  If conservative treatment (as above) does not work, stretching the end of the esophagus with a balloon may help.  Sometimes surgical treatment is used and a segment of esophagus is removed. SEEK IMMEDIATE MEDICAL CARE IF:  You are unable to keep fluids down or it feels as though food sticks in your chest area.  Vomiting becomes persistent.  Chest or belly pain develops, increases, or localizes.  You have a fever. If problems are continuing and not allowing you to live a normal lifestyle, talk with your caregiver and discuss medical means to help this. Document Released: 05/07/2005 Document Revised: 10/20/2011 Document Reviewed: 08/20/2006 [facility] Patient Information 2015 [place], [place]. This information is not intended to replace advice given to you by your health care provider. Make sure you discuss any questions you have with your health care provider.

## 2015-05-03 NOTE — Interval H&P Note (Signed)
History and Physical Interval Note:  05/03/2015 7:27 AM  Melinda Drake  has presented today for surgery, with the diagnosis of Achalasia  The various methods of treatment have been discussed with the patient and family. After consideration of risks, benefits and other options for treatment, the patient has consented to  Procedure(s): XI ROBOTIC ASSISTED LAPAROSCOPIC HELLER CARDIOMYOTOMY AND PARTIAL TOUPET FUNDOPLICATION (N/A) as a surgical intervention .  The patient's history has been reviewed, patient examined, no change in status, stable for surgery.  I have reviewed the patient's chart and labs.  Questions were answered to the patient's satisfaction.     GROSS,STEVEN C.

## 2015-05-03 NOTE — H&P (Addendum)
Melinda Drake 03/20/2015 11:07 AM Location: [facility] Patient #: [id number] DOB: [birth year] Single / Language: Cleophus Molt / Race: Black or African American Female  History of Present Illness  Patient words: achalasia.  The patient is a 59 year old female who presents with gastroesophageal reflux disease. Patient sent for surgical consultation by Dr. Clarene Essex for concern of achalasia.  Pleasant woman. Diabetic control with Lantus. Former smoker. Has noted worsening dysphagia for over a decade. Worked up when she lived in [place]. Recently relocated to [place]. Also having Botox injections done once endoscopically. Notes his had worsening dysphagia to solids over the past few years. Often has to drink several glasses of water to help flush things into her stomach. Had issues of nausea vomiting and pseudo-reflux. Less of an issue lately but has modified to small or more frequent meals. Often has her hypereructation. No history of abdominal surgeries. She walks 3 miles a day without difficulty. Has bowel movements on most days.  Because of persistent/worsening dysphagia and diagnosis of achalasia, she was sent to gastroenterology. Dr. Watt Climes noted obstruction on fluoroscopy and confirmed on endoscopy. Manometry shows complete aperistalsis. Lower esophageal sphincter seems weak on manometry but that clashes with obvious tight narrowing on endoscopy and fluoroscopy.      Other Problems Marjean Donna, CMA; 03/20/2015 11:07 AM) Arthritis Cancer Diabetes Mellitus Gastroesophageal Reflux Disease Hypercholesterolemia  Past Surgical History Marjean Donna, CMA; 03/20/2015 11:07 AM) Cataract Surgery Bilateral. Aadil Sur Polyp Removal - Colonoscopy Sentinel Lymph Node Biopsy  Diagnostic Studies History Marjean Donna, CMA; 03/20/2015 11:07 AM) Colonoscopy within last year Mammogram 1-3 years ago Pap Smear 1-5 years ago  Allergies Marjean Donna, CMA; 03/20/2015 11:09  AM) Codeine Phosphate *ANALGESICS - OPIOID*  Medication History (Sonya Bynum, CMA; 03/20/2015 11:10 AM) Atorvastatin Calcium (10MG  Tablet, Oral) Active. ZyrTEC Allergy (10MG  Capsule, Oral) Active. Vitamin D3 (2000UNIT Capsule, Oral) Active. Lantus SoloStar (100UNIT/ML Soln Pen-inj, Subcutaneous) Active. Medications Reconciled  Social History Marjean Donna, CMA; 03/20/2015 11:07 AM) Alcohol use Occasional alcohol use. Caffeine use Carbonated beverages, Coffee. No drug use Tobacco use Former smoker.  Family History Marjean Donna, [place]; 03/20/2015 11:07 AM) Alcohol Abuse Father. Arthritis Mother, Sister. Cancer Mother. Cerebrovascular Accident Family Members In [place] Members In General. [place] Polyps Family Members In General. Diabetes Mellitus Mother. Heart Disease Mother. Heart disease in female family member before age 34 Hypertension Mother, Sister. Ovarian Cancer Family Members In General. Prostate Cancer Family Members In General. Thyroid problems Sister.  Pregnancy / Birth History Marjean Donna, [place]; 03/20/2015 11:07 AM) Age at menarche 78 years. Age of menopause 52-60 Gravida 0 Irregular periods Para 0  Review of Systems Davy Pique Bynum CMA; 03/20/2015 11:07 AM) General Not Present- Appetite Loss, Chills, Fatigue, Fever, Night Sweats, Weight Gain and Weight Loss. Skin Not Present- Change in Wart/Mole, Dryness, Hives, Jaundice, New Lesions, Non-Healing Wounds, Rash and Ulcer. HEENT Present- Seasonal Allergies. Not Present- Earache, Hearing Loss, Hoarseness, Nose Bleed, Oral Ulcers, Ringing in the Ears, Sinus Pain, Sore Throat, Visual Disturbances, Wears glasses/contact lenses and Yellow Eyes. Respiratory Present- Snoring. Not Present- Bloody sputum, Chronic Cough, Difficulty Breathing and Wheezing. Breast Not Present- Breast Mass, Breast Pain, Nipple Discharge and Skin Changes. Cardiovascular Present- Leg Cramps. Not Present- Chest  Pain, Difficulty Breathing Lying Down, Palpitations, Rapid Heart Rate, Shortness of Breath and Swelling of Extremities. Gastrointestinal Present- Constipation and Difficulty Swallowing. Not Present- Abdominal Pain, Bloating, Bloody Stool, Change in Bowel Habits, Chronic diarrhea, Excessive gas, Gets full quickly at meals, Hemorrhoids, Indigestion, Nausea,  Rectal Pain and Vomiting. Female Genitourinary Not Present- Frequency, Nocturia, Painful Urination, Pelvic Pain and Urgency. Musculoskeletal Present- Joint Pain and Joint Stiffness. Not Present- Back Pain, Muscle Pain, Muscle Weakness and Swelling of Extremities. Neurological Not Present- Decreased Memory, Fainting, Headaches, Numbness, Seizures, Tingling, Tremor, Trouble walking and Weakness. Psychiatric Not Present- Anxiety, Bipolar, Change in Sleep Pattern, Depression, Fearful and Frequent crying. Endocrine Not Present- Cold Intolerance, Excessive Hunger, Hair Changes, Heat Intolerance, Hot flashes and New Diabetes. Hematology Not Present- Easy Bruising, Excessive bleeding, Gland problems, HIV and Persistent Infections.   Vitals (Sonya Bynum CMA; 03/20/2015 11:08 AM) 03/20/2015 11:07 AM Weight: 182 lb Height: 64in Body Surface Area: 1.93 m Body Mass Index: 31.24 kg/m Temp.: 98.60F(Temporal)  Pulse: 74 (Regular)  BP: 132/77 (Sitting, Left Arm, Standard)    Physical Exam Adin Hector MD; 03/20/2015 11:51 AM) General Mental Status-Alert. General Appearance-Not in acute distress, Not Sickly. Orientation-Oriented X3. Hydration-Well hydrated. Voice-Normal.  Integumentary Global Assessment Upon inspection and palpation of skin surfaces of the - Axillae: non-tender, no inflammation or ulceration, no drainage. and Distribution of scalp and body hair is normal. General Characteristics Temperature - normal warmth is noted.  Head and Neck Head-normocephalic, atraumatic with no lesions or palpable  masses. Face Global Assessment - atraumatic, no absence of expression. Neck Global Assessment - no abnormal movements, no bruit auscultated on the right, no bruit auscultated on the left, no decreased range of motion, non-tender. Trachea-midline. Thyroid Gland Characteristics - non-tender.  Eye Eyeball - Left-Extraocular movements intact, No Nystagmus. Eyeball - Right-Extraocular movements intact, No Nystagmus. Cornea - Left-No Hazy. Cornea - Right-No Hazy. Sclera/Conjunctiva - Left-No scleral icterus, No Discharge. Sclera/Conjunctiva - Right-No scleral icterus, No Discharge. Pupil - Left-Direct reaction to light normal. Pupil - Right-Direct reaction to light normal.  ENMT Ears Pinna - Left - no drainage observed, no generalized tenderness observed. Right - no drainage observed, no generalized tenderness observed. Nose and Sinuses External Inspection of the Nose - no destructive lesion observed. Inspection of the nares - Left - quiet respiration. Right - quiet respiration. Mouth and Throat Lips - Upper Lip - no fissures observed, no pallor noted. Lower Lip - no fissures observed, no pallor noted. Nasopharynx - no discharge present. Oral Cavity/Oropharynx - Tongue - no dryness observed. Oral Mucosa - no cyanosis observed. Hypopharynx - no evidence of airway distress observed.  Chest and Lung Exam Inspection Movements - Normal and Symmetrical. Accessory muscles - No use of accessory muscles in breathing. Palpation Palpation of the chest reveals - Non-tender. Auscultation Breath sounds - Normal and Clear.  Cardiovascular Auscultation Rhythm - Regular. Murmurs & Other Heart Sounds - Auscultation of the heart reveals - No Murmurs and No Systolic Clicks.  Abdomen Inspection Inspection of the abdomen reveals - No Visible peristalsis and No Abnormal pulsations. Umbilicus - No Bleeding, No Urine drainage. Palpation/Percussion Palpation and Percussion of the abdomen  reveal - Soft, Non Tender, No Rebound tenderness, No Rigidity (guarding) and No Cutaneous hyperesthesia. Note: Overweight but soft. No diastases. No umbilical hernia.   Female Genitourinary Sexual Maturity Tanner 5 - Adult hair pattern. Note: No vaginal bleeding nor discharge. No inguinal hernias   Peripheral Vascular Upper Extremity Inspection - Left - No Cyanotic nailbeds, Not Ischemic. Right - No Cyanotic nailbeds, Not Ischemic.  Neurologic Neurologic evaluation reveals -normal attention span and ability to concentrate, able to name objects and repeat phrases. Appropriate fund of knowledge , normal sensation and normal coordination. Mental Status Affect - not angry, not paranoid. Cranial Nerves-Normal  Bilaterally. Gait-Normal.  Neuropsychiatric Mental status exam performed with findings of-able to articulate well with normal speech/language, rate, volume and coherence, thought content normal with ability to perform basic computations and apply abstract reasoning and no evidence of hallucinations, delusions, obsessions or homicidal/suicidal ideation.  Musculoskeletal Global Assessment Spine, Ribs and Pelvis - no instability, subluxation or laxity. Right Upper Extremity - no instability, subluxation or laxity.  Lymphatic Head & Neck  General Head & Neck Lymphatics: Bilateral - Description - No Localized lymphadenopathy. Axillary  General Axillary Region: Bilateral - Description - No Localized lymphadenopathy. Femoral & Inguinal  Generalized Femoral & Inguinal Lymphatics: Left - Description - No Localized lymphadenopathy. Right - Description - No Localized lymphadenopathy.    Assessment & Plan  ACHALASIA (530.0  K22.0) Impression: History and endoscopic/fluoroscopic/manometric findings strongly suspicious for achalasia. While the manometric findings implied a poor contracting LES, I suspect the probe didn't actually go across it and was more in the distal  esophagus given the fact that her LES was tight on endoscopy and tight on fluoroscopy.  I think she would benefit from surgical esophagocardiomyotomy. Minimally invasive approach. Robotically. Plan partial fundoplication. Usually a posterior toupet fundoplication.  She is interested seeing did not really make a decision yet. Most likely she will do this in the fall when she has more time to recover. Current Plans  Schedule for Surgery Written instructions provided The anatomy & physiology of the foregut and anti-reflux mechanism was discussed. The pathophysiology of achalasia was discussed. Natural history risks without surgery was discussed. The patient's symptoms are not adequately controlled by non-operative treatments. I feel the risks of no intervention will lead to serious problems that outweigh the operative risks; therefore, I recommended surgery to relax the fibrotic LES & rebuild the anti-reflux valve to control reflux. Need for a thorough workup to rule out the differential diagnosis and plan treatment was explained. I explained laparoscopic techniques with possible need for an open approach.  Risks such as bleeding, infection, abscess, leak, need for further treatment, heart attack, death, and other risks were discussed. I noted a good likelihood this will help address the problem. Goals of post-operative recovery were discussed as well. Possibility that this will not correct all symptoms was explained. Post-operative dysphagia, need for short-term liquid & pureed diet, inability to vomit, possibility of recurrence needing further treatment, possible need for medicines to help control symptoms in addition to surgery were discussed. We will work to minimize complications. Educational handouts further explaining the pathology, treatment options, and dysphagia diet was given as well. Questions were answered. The patient expresses understanding & wishes to proceed with surgery. Pt Education - [facility]  Achalasia HCI (Gross) Pt Education - [facility] Esophageal Surgery Diet HCI (Gross): discussed with patient and provided information. Pt Education - [facility] Laparosopic Post Op HCI (Gross) Pt Education - [facility] Good Bowel Health (Gross)  Adin Hector, M.D., F.A.C.S. Gastrointestinal and Minimally Invasive Surgery [facility] [address], [place], [place] [id number] [phone] Main / Paging

## 2015-05-03 NOTE — Op Note (Signed)
05/03/2015  11:39 AM  PATIENT:  Melinda Drake  59 y.o. female  Patient Care Team: Lujean Amel, MD as PCP - General (Family Medicine)  PRE-OPERATIVE DIAGNOSIS:  Achalasia  POST-OPERATIVE DIAGNOSIS:  Achalasia  PROCEDURE:   Laparoscopic lysis adhesions 60 minutes. XI ROBOTIC ASSISTED LAPAROSCOPIC HELLER CARDIOMYOTOMY  Toupet (posterior 035WSF) fundoplication x 4cm. Anterior & posterior gastropexy Type 2 mediastinal dissection.   SURGEON:  Surgeon(s): Michael Boston, MD   Stark Klein, MD - Assist  ANESTHESIA:   local and general  EBL:  Total I/O In: 2000 [I.V.:2000] Out: 250 [Urine:200; Blood:50]  Delay start of Pharmacological VTE agent (>24hrs) due to surgical blood loss or risk of bleeding:  no  ANESTHESIA: 1. General anesthesia. 2. Local anesthetic in a field block around all port sites.  SPECIMEN:  Mediastinal hernia sac (not sent).  DRAINS:  A 19-French Blake drain goes from the right upper quadrant along the lesser curvature of the stomach into the mediastinum.  COUNTS:  YES  PLAN OF CARE: Admit for overnight observation  PATIENT DISPOSITION:  PACU - hemodynamically stable.  INDICATION:   Patient with symptomatic Achalasia.  The patient has had extensive work-up & is felt to benefit from repair:  The anatomy & physiology of the foregut and anti-reflux mechanism was discussed.  The pathophysiology of achalasia was discussed.  Natural history risks without surgery was discussed.   The patient's symptoms are not adequately controlled by non-operative treatments.  I feel the risks of no intervention will lead to serious problems that outweigh the operative risks; therefore, I recommended surgery to relax the fibrotic LES &  rebuild the anti-reflux valve to control reflux.  Need for a thorough workup to rule out the differential diagnosis and plan treatment was explained.  I explained laparoscopic techniques with possible need for an open approach.  Risks  such as bleeding, infection, abscess, leak, need for further treatment, heart attack, death, and other risks were discussed.  I noted a good likelihood this will help address the problem.   Goals of post-operative recovery were discussed as well.  Possibility that this will not correct all symptoms was explained.  Post-operative dysphagia, need for short-term liquid & pureed diet, inability to vomit, possibility of recurrence needing further treatment, possible need for medicines to help control symptoms in addition to surgery were discussed.  We will work to minimize complications.   Educational handouts further explaining the pathology, treatment options, and dysphagia diet was given as well.  Questions were answered.  The patient expresses understanding & wishes to proceed with surgery.  OR FINDINGS:   No PEH hiatal hernia  It is a primary crural repair x 3   The myotomy is 10 cm long on the distal esophagus & 3 cm long on the cardia/anterior stomach.  The patient has a 5 cm Toupet (posterior 681 deg) fundoplication.  The patient has had bilateral anterior and posterior gastropexies.  DESCRIPTION:   Informed consent was confirmed.  The patient received IV antibiotics prior to incision.  The underwent general anesthesia without difficulty.  A Foley catheter was sterilely placed.  The patient was positioned in split leg with arms tucked.  Orogastric tube evacuated 500 mL of brackish fluid out of the esophagus.  The abdomen was prepped and draped in the sterile fashion.  Surgical time-out confirmed our plan.  I used a Varess technique in the left subcostal region.  No blood & flushed well.  Induced capnoperitoneum.  After 15 mmHg pressure, an 59mm Xi port  was placed in the left lateral upper quadrant.  Entry was clean.  Inspection revealed no injury.  Additional 8 mm Xi robotic ports were placed in the upper abdomen.  There were significant omental adhesions in the bilateral upper quadrants.  I  gradually freed those off the falciform ligament as well.  Camera inspection revealed no injury.  I also placed a 5 mm port in the left subxiphoid region under direct visualization.  I removed that and placed an Omega-shaped rigid Nathanson liver retractor to lift the left lateral sector of the liver anteriorly to expose the esophageal hiatus.  This was secured to the bed using the iron man system.  I placed another 31mm port in the RLQ for the assist.  I entered through the thin hepatogastric ligament.  With that I could get into the anterior mediastinum between the right crus and esophagus.  I used primarily focused gentle blunt dissection as well as focused vessel sealer dissection.  I transected phrenoesophageal attachments to the inner right crus sac until I found the base of the crura.  I then came around anteriorly on the left side and freed up the phrenoesophageal attachments on the medial part of the left crus on the superior half.   We ligated the short gastrics along the lesser curvature of the stomach about a third way down and then came up over the fundus.  We released the attachments of the stomach to the retroperitoneum until we were able to connect with the prior dissection on the left crus.  We completed the release of phrenoesophageal attachments to the medial part of the left crus down to its base.  With this, we had circumferential mobilization.    We placed the stomach and esophagus on axial tension.  I then did a type 2 mediastinal dissection where I freed the esophagus from its attachments to the aorta, pleura, and pericardium using primarily gentle blunt as well as focused ultrasonic dissection.  We saw the anterior vagus nerve & preserved if anteriorly.  The posterior vagus nerve was intact.  We preserved it at all times.  With that I could straighten out the esophagus and get 5 cm of intra-abdominal length of the esophagus at a best estimation.  The esophagus seemed strictured distally  and was very dilated in the mediastinum consistent with chronic inflammation.  I mobilized the anterior vagus nerve off the esophagus as it crossed over more towards the left more proximally.  This helped expose the anterior esophagus and anterior cardia.  I dissected out & removed the anterior epiphrenic pad.  Posterior epiphrenic fat was small & left in place.  With that, I could better define the esophagogastric junction.  I brought the fundus of the stomach posterior to the esophagus over to the right side.  The wrap was mobile with the classic shoe shinebmaneuver.  Wrap became together gently.  I then proceeded with myotomy using focused blunt dissection and hook cautery.  I carefully split the longitudinal fibers of the distal esophagus.  I came underneath the circular ring fibers of the esophagus.  They were very fibrotic.  I freed off the mucosa.  Transected across them using some gentle avulsion technique as well as focused hook dissection with the blade up.  We continued more proximally up above the esophageal hiatus.  This took some time as there was some moderate scarring consistent with prior Botox injections & dilations. The ring muscle fibers were quite thickened and fibrotic 4 cm up the esophagus.  I ended up extending this more proximally in usual until I came to more soft and pink normal circular fibers, implying I was above the most strictured and fibrotic area. This is around 10 cm from the esophagogastric junction.  Then redirected and proceeded to free off the fibers off the anterior cardia of the stomach, taking care to avoid entry into the mucosa.  Skeletonized and freed off oblique fibers.  Because of severe scarring in [place] dissection did have to repair 2 areas of mucosa with 4-0 Monocryl suture horizontal mattress suture to good result.  I measured the length of myotomy and it was 10 cm on the esophagus and 3 cm onto the cardia.  I meticulously reinspected.  Branching transverse  vessels on the mucosa were carefully elevated and transected.  I direction visualization with good robotic camera work.  I was able to free muscle off so that the mucosa was exposed for about a third of the circumference for a nice broad myotomy.  We then reflected the stomach left laterally and closed the esophageal hiatus using 0 Ethibond stitch.  I did that x3 stitches, Incorporating the posterior part of the wrap for a good posterior gastropexy 3. The crura had good substance and they came together well without any tension.  We avoided making it too snug.  I then did a left anterior gastropexy taking the apex of the left side of the wrap and tacked it to the left anterior crura just posterior to the phrenic vein.  Also a bite of the left anterior side of the esophageal myotomy.  I tied that down.  I did a similar stitch on the right anterior aspect in a mirror-image fashion.  This created an anterior gastropexies on each side and helped the wrap for completion.  I did 3 more pairs of stitches distally, taking a bite of the side of the wrap with a myotomy for a posterior Toupet fundoplication.  I did an extra stitch on the right side that was between the wrap and the myotomy of the right medial cardia of the stomach to help keep the myotomy open.  I measured the length of the fundoplication.  It was 5 cm.  This help to allow the mucosa to be well exposed and the myotomy to stay wide open.  Again no evidence of leak / perforation.  I did irrigation and ensured hemostasis on the liver.  I saw no evidence of any leak or perforation or other abnormality.  No evidence of any mucosal gastric or bowel injury.  I removed the [facility] liver retractor under direct visualization.  Hemostasis was good.I evacuated carbon dioxide and removed the ports.  The skin was closed with Monocryl and sterile dressings applied.  The patient is extubated and brought back to the recovery room.  I discussed postop care in detail  with the patient and family in in the office.  Discussed again with the patient in the holding area.  Instructions are written. The patient has no one here.  I did offer discuss with family recall them.  He declined.  We will see if anyone shows up tomorrow.  Adin Hector, M.D., F.A.C.S. Gastrointestinal and Minimally Invasive Surgery [facility] [address] [place], [place] [address] [phone] Main / Paging Adin Hector, M.D., F.A.C.S. Gastrointestinal and Minimally Invasive Surgery [facility] [address], [place], [place] [address] [phone] Main / Paging

## 2015-05-03 NOTE — Anesthesia Procedure Notes (Signed)
Procedure Name: Intubation Date/Time: 05/03/2015 7:44 AM Performed by: Azzie Thiem, Virgel Gess Pre-anesthesia Checklist: Patient identified, Emergency Drugs available, Suction available, Patient being monitored and Timeout performed Patient Re-evaluated:Patient Re-evaluated prior to inductionOxygen Delivery Method: Circle system utilized Preoxygenation: Pre-oxygenation with 100% oxygen Intubation Type: IV induction Ventilation: Mask ventilation without difficulty Laryngoscope Size: Mac and 4 Grade View: Grade II Tube type: Oral Tube size: 7.5 mm Number of attempts: 1 Airway Equipment and Method: Stylet Placement Confirmation: ETT inserted through vocal cords under direct vision,  positive ETCO2,  CO2 detector and breath sounds checked- equal and bilateral Secured at: 22 cm Tube secured with: Tape Dental Injury: Teeth and Oropharynx as per pre-operative assessment

## 2015-05-03 NOTE — Anesthesia Postprocedure Evaluation (Signed)
  Anesthesia Post-op Note  Patient: Melinda Drake  Procedure(s) Performed: Procedure(s) (LRB): XI ROBOTIC ASSISTED LAPAROSCOPIC HELLER CARDIOMYOTOMY AND PARTIAL TOUPET FUNDOPLICATION (N/A) LAPAROSCOPIC LYSIS OF ADHESIONS  Patient Location: PACU  Anesthesia Type: General  Level of Consciousness: awake and alert   Airway and Oxygen Therapy: Patient Spontanous Breathing  Post-op Pain: mild  Post-op Assessment: Post-op Vital signs reviewed, Patient's Cardiovascular Status Stable, Respiratory Function Stable, Patent Airway and No signs of Nausea or vomiting  Last Vitals:  Filed Vitals:   05/03/15 1435  BP: 151/70  Pulse: 78  Temp: 36.7 C  Resp: 18    Post-op Vital Signs: stable   Complications: No apparent anesthesia complications

## 2015-05-04 ENCOUNTER — Observation Stay (HOSPITAL_COMMUNITY): Payer: BLUE CROSS/BLUE SHIELD

## 2015-05-04 DIAGNOSIS — K220 Achalasia of cardia: Secondary | ICD-10-CM | POA: Diagnosis not present

## 2015-05-04 LAB — GLUCOSE, CAPILLARY
GLUCOSE-CAPILLARY: 103 mg/dL — AB (ref 65–99)
Glucose-Capillary: 137 mg/dL — ABNORMAL HIGH (ref 65–99)

## 2015-05-04 MED ORDER — IOHEXOL 300 MG/ML  SOLN
150.0000 mL | Freq: Once | INTRAMUSCULAR | Status: DC | PRN
  Administered 2015-05-04: 40 mL via ORAL
  Filled 2015-05-04: qty 150

## 2015-05-04 MED ORDER — PANTOPRAZOLE SODIUM 40 MG PO PACK
40.0000 mg | PACK | Freq: Two times a day (BID) | ORAL | Status: DC
  Administered 2015-05-04: 40 mg via ORAL
  Filled 2015-05-04 (×3): qty 20

## 2015-05-04 NOTE — Progress Notes (Signed)
Patient instructed on a pureed diet and not to drink carbonated beverages.  Diet information provided from Nutritional Care Manual.  Discussed with patient how to crush medications and to not to crush extended release medications.  Patient states she is not on any controlled or extended release medications.  Patient ordered pureed diet from at your request.

## 2015-05-04 NOTE — Discharge Summary (Signed)
Physician Discharge Summary  Patient ID: Melinda Drake MRN: [id number] DOB/AGE: 04/12/1956 59 y.o.  Admit date: 05/03/2015 Discharge date: 05/04/2015  Patient Care Team: Dibas Koirala, MD as PCP - General (Family Medicine) Michael Boston, MD as Consulting Physician (General Surgery) Clarene Essex, MD as Consulting Physician (Gastroenterology)  Admission Diagnoses: Principal Problem:   Achalasia s/p Heller myotomy/Toupet 05/03/2015 Active Problems:   Diabetes mellitus without complication   Arthritis   Discharge Diagnoses:  Principal Problem:   Achalasia s/p Heller myotomy/Toupet 05/03/2015 Active Problems:   Diabetes mellitus without complication   Arthritis   POST-OPERATIVE DIAGNOSIS:   Achalasia  PROCEDURE:  Laparoscopic lysis adhesions 60 minutes. XI ROBOTIC ASSISTED LAPAROSCOPIC HELLER CARDIOMYOTOMY  Toupet (posterior 166AYT) fundoplication x 4cm. Anterior & posterior gastropexy Type 2 mediastinal dissection.  SURGEON:    Surgeon(s): Michael Boston, MD  Consults: None  Hospital Course:   The patient underwent the surgery above.  Postoperatively, the patient gradually mobilized and advanced to a pureed diet.  Pain and other symptoms were treated aggressively.    By the time of discharge, the patient was walking well the hallways, eating food, having flatus.  Pain was well-controlled on an oral medications.  Based on meeting discharge criteria and continuing to recover, I felt it was safe for the patient to be discharged from the hospital to further recover with close followup. Postoperative recommendations were discussed in detail.  They are written as well.   Significant Diagnostic Studies:  Results for orders placed or performed during the hospital encounter of 05/03/15 (from the past 72 hour(s))  Glucose, capillary     Status: None   Collection Time: 05/03/15  5:37 AM  Result Value Ref Range   Glucose-Capillary 81 65 - 99 mg/dL  Glucose, capillary      Status: Abnormal   Collection Time: 05/03/15 11:25 AM  Result Value Ref Range   Glucose-Capillary 151 (H) 65 - 99 mg/dL   Comment 1 Notify RN    Comment 2 Document in Chart   Glucose, capillary     Status: Abnormal   Collection Time: 05/03/15  6:24 PM  Result Value Ref Range   Glucose-Capillary 154 (H) 65 - 99 mg/dL  Glucose, capillary     Status: Abnormal   Collection Time: 05/03/15  9:22 PM  Result Value Ref Range   Glucose-Capillary 119 (H) 65 - 99 mg/dL  Glucose, capillary     Status: Abnormal   Collection Time: 05/04/15  7:47 AM  Result Value Ref Range   Glucose-Capillary 103 (H) 65 - 99 mg/dL    No results found.  Discharge Exam: Blood pressure 122/58, pulse 83, temperature 98.9 F (37.2 C), temperature source Oral, resp. rate 18, height 5\' 4"  (1.626 m), weight 81.647 kg (180 lb), SpO2 94 %.  General: Pt awake/alert/oriented x4 in no major acute distress Eyes: PERRL, normal EOM. Sclera nonicteric Neuro: CN II-XII intact w/o focal sensory/motor deficits. Lymph: No head/neck/groin lymphadenopathy Psych:  No delerium/psychosis/paranoia HENT: Normocephalic, Mucus membranes moist.  No thrush Neck: Supple, No tracheal deviation Chest: No pain.  Good respiratory excursion. CV:  Pulses intact.  Regular rhythm MS: Normal AROM mjr joints.  No obvious deformity Abdomen: Soft, Nondistended.  Min tender.  No incarcerated hernias. Ext:  SCDs BLE.  No significant edema.  No cyanosis Skin: No petechiae / purpura  Discharged Condition: good   Past Medical History  Diagnosis Date  . Diabetes mellitus without complication   . Non Hodgkin's lymphoma 1997  . Bronchitis   .  GERD (gastroesophageal reflux disease)   . Arthritis   . H/O stem cell transplant 1997  . History of biopsy 1997    left side neck  . PONV (postoperative nausea and vomiting)   . Family history of adverse reaction to anesthesia     N/V     Past Surgical History  Procedure Laterality Date  . Eye surgery   ? early 2000's    bilateral cataracts  . Lymphoma excision Left 1996  . Ankle fusion Left 04/27/2013    Procedure: ANKLE FUSION- left;  Surgeon: Newt Minion, MD;  Location: [place];  Service: Orthopedics;  Laterality: Left;  Left Subtalar Fusion and Talonavicular Fusion  . Esophageal manometry N/A 03/05/2015    Procedure: ESOPHAGEAL MANOMETRY (EM);  Surgeon: Clarene Essex, MD;  Location: [facility];  Service: Endoscopy;  Laterality: N/A;  . Laparoscopic lysis of adhesions  05/03/2015    Procedure: LAPAROSCOPIC LYSIS OF ADHESIONS;  Surgeon: Michael Boston, MD;  Location: [facility];  Service: General;;    Social History   Social History  . Marital Status: Single    Spouse Name: N/A  . Number of Children: N/A  . Years of Education: N/A   Occupational History  . Not on file.   Social History Main Topics  . Smoking status: Former Smoker -- 0.25 packs/day    Types: Cigarettes  . Smokeless tobacco: Never Used  . Alcohol Use: Yes     Comment: socially  . Drug Use: No  . Sexual Activity: Not on file   Other Topics Concern  . Not on file   Social History Narrative    Family History  Problem Relation Age of Onset  . Diabetes Mother   . Coronary artery disease Mother   . Hypertension Mother   . Thyroid nodules Sister   . Hypertension Sister     Current Facility-Administered Medications  Medication Dose Route Frequency Provider Last Rate Last Dose  . 0.9 %  sodium chloride infusion  250 mL Intravenous PRN Michael Boston, MD      . acetaminophen (TYLENOL) tablet 1,000 mg  1,000 mg Oral TID Michael Boston, MD   1,000 mg at 05/03/15 1554  . alum & mag hydroxide-simeth (MAALOX/MYLANTA) 200-200-20 MG/5ML suspension 30 mL  30 mL Oral Q6H PRN Michael Boston, MD      . atorvastatin (LIPITOR) tablet 20 mg  20 mg Oral Daily Michael Boston, MD      . calcium carbonate (TUMS - dosed in mg elemental calcium) chewable tablet 200 mg of elemental calcium  1 tablet Oral Daily PRN Michael Boston, MD      .  cholecalciferol (VITAMIN D) tablet 2,000 Units  2,000 Units Oral Daily Michael Boston, MD      . diphenhydrAMINE (BENADRYL) injection 12.5-25 mg  12.5-25 mg Intravenous Q6H PRN Michael Boston, MD      . enoxaparin (LOVENOX) injection 40 mg  40 mg Subcutaneous Q24H Michael Boston, MD      . HYDROmorphone (DILAUDID) injection 0.5-2 mg  0.5-2 mg Intravenous Q2H PRN Michael Boston, MD   1 mg at 05/04/15 7062  . Influenza vac split quadrivalent PF (FLUARIX) injection 0.5 mL  0.5 mL Intramuscular Tomorrow-1000 Michael Boston, MD      . insulin aspart (novoLOG) injection 0-15 Units  0-15 Units Subcutaneous TID WC Michael Boston, MD   3 Units at 05/03/15 1832  . insulin aspart (novoLOG) injection 0-5 Units  0-5 Units Subcutaneous QHS Michael Boston, MD   0 Units  at 05/03/15 2122  . insulin glargine (LANTUS) injection 0-20 Units  0-20 Units Subcutaneous QHS Michael Boston, MD   20 Units at 05/03/15 2138  . lactated ringers bolus 1,000 mL  1,000 mL Intravenous Q8H PRN Michael Boston, MD      . Linaclotide Rolan Lipa) capsule 290 mcg  290 mcg Oral Daily Michael Boston, MD      . lip balm (CARMEX) ointment 1 application  1 application Topical BID Michael Boston, MD   1 application at 82/99/37 2135  . lip balm (CARMEX) ointment           . loratadine (CLARITIN) tablet 10 mg  10 mg Oral Daily Michael Boston, MD      . LORazepam (ATIVAN) injection 0.5-1 mg  0.5-1 mg Intravenous Q8H PRN Michael Boston, MD      . magic mouthwash  15 mL Oral QID PRN Michael Boston, MD      . menthol-cetylpyridinium (CEPACOL) lozenge 3 mg  1 lozenge Oral PRN Michael Boston, MD      . metoCLOPramide (REGLAN) injection 5-10 mg  5-10 mg Intravenous Q6H PRN Michael Boston, MD      . metoprolol tartrate (LOPRESSOR) tablet 12.5 mg  12.5 mg Oral Q12H PRN Michael Boston, MD      . ondansetron [facility]) injection 4 mg  4 mg Intravenous Q6H PRN Michael Boston, MD   4 mg at 05/04/15 1696   Or  . ondansetron (ZOFRAN) 8 mg in sodium chloride 0.9 % 50 mL IVPB  8 mg Intravenous Q6H  PRN Michael Boston, MD      . ondansetron (ZOFRAN-ODT) disintegrating tablet 4-8 mg  4-8 mg Oral Q6H PRN Michael Boston, MD      . oxyCODONE (Oxy IR/ROXICODONE) immediate release tablet 5-10 mg  5-10 mg Oral Q4H PRN Michael Boston, MD      . pantoprazole (PROTONIX) EC tablet 40 mg  40 mg Oral BID AC Michael Boston, MD      . phenol (CHLORASEPTIC) mouth spray 2 spray  2 spray Mouth/Throat PRN Michael Boston, MD      . promethazine (PHENERGAN) injection 6.25-12.5 mg  6.25-12.5 mg Intravenous Q4H PRN Michael Boston, MD      . sodium chloride 0.9 % injection 3 mL  3 mL Intravenous Q12H Michael Boston, MD      . sodium chloride 0.9 % injection 3 mL  3 mL Intravenous PRN Michael Boston, MD      . zolpidem (AMBIEN) tablet 5 mg  5 mg Oral QHS PRN Michael Boston, MD         Allergies  Allergen Reactions  . Codeine Nausea Only    Disposition: 01-Home or Self Care  Discharge Instructions    Call MD for:  extreme fatigue    Complete by:  As directed      Call MD for:  extreme fatigue    Complete by:  As directed      Call MD for:  hives    Complete by:  As directed      Call MD for:  hives    Complete by:  As directed      Call MD for:  persistant nausea and vomiting    Complete by:  As directed      Call MD for:  persistant nausea and vomiting    Complete by:  As directed      Call MD for:  redness, tenderness, or signs of infection (pain, swelling, redness, odor or green/yellow discharge  around incision site)    Complete by:  As directed      Call MD for:  redness, tenderness, or signs of infection (pain, swelling, redness, odor or green/yellow discharge around incision site)    Complete by:  As directed      Call MD for:  severe uncontrolled pain    Complete by:  As directed      Call MD for:  severe uncontrolled pain    Complete by:  As directed      Call MD for:    Complete by:  As directed   Temperature > 101.28F     Call MD for:    Complete by:  As directed   Temperature > 101.28F     Diet - low  sodium heart healthy    Complete by:  As directed      Diet general    Complete by:  As directed   Pureed diet at discharge.  See ESOPHAGEAL SURGERY postop instruction diet sheet     Discharge instructions    Complete by:  As directed   Please see discharge instruction sheets.  Also refer to handout given an office.  Please call our office if you have any questions or concerns [phone]     Discharge instructions    Complete by:  As directed   Please see discharge instruction sheets.  Also refer to handout given an office.  Please call our office if you have any questions or concerns [phone]     Discharge wound care:    Complete by:  As directed   If you have closed incisions, shower and bathe over these incisions with soap and water every day.  Remove all surgical dressings on postoperative day #3.  You do not need to replace dressings over the closed incisions unless you feel more comfortable with a Band-Aid covering it.   If you have an open wound that requires packing, please see wound care instructions.  In general, remove all dressings, wash wound with soap and water and then replace with saline moistened gauze.  Do the dressing change at least every day.  Please call our office [phone] if you have further questions.     Discharge wound care:    Complete by:  As directed   If you have closed incisions, shower and bathe over these incisions with soap and water every day.  Remove all surgical dressings on postoperative day #3.  You do not need to replace dressings over the closed incisions unless you feel more comfortable with a Band-Aid covering it.   If you have an open wound that requires packing, please see wound care instructions.  In general, remove all dressings, wash wound with soap and water and then replace with saline moistened gauze.  Do the dressing change at least every day.  Please call our office [phone] if you have further questions.     Driving  Restrictions    Complete by:  As directed   No driving until off narcotics and can safely swerve away without pain during an emergency     Driving Restrictions    Complete by:  As directed   No driving until off narcotics and can safely swerve away without pain during an emergency     Increase activity slowly    Complete by:  As directed   Walk an hour a day.  Use 20-30 minute walks.  When you can walk 30 minutes without difficulty, it is  fine to restart low impact/moderate activities such as biking, jogging, swimming, sexual activity, etc.  Eventually you can increase to unrestricted activity when not feeling pain.  If you feel pain: STOP!Marland Kitchen   Let pain protect you from overdoing it.  Use ice/heat & over-the-counter pain medications to help minimize soreness.  If that is not enough, then use your narcotic pain prescription as needed to remain active.  It is better to take extra pain medications and be more active than to stay bedridden to avoid all pain medications.     Increase activity slowly    Complete by:  As directed   Walk an hour a day.  Use 20-30 minute walks.  When you can walk 30 minutes without difficulty, it is fine to restart low impact/moderate activities such as biking, jogging, swimming, sexual activity, etc.  Eventually you can increase to unrestricted activity when not feeling pain.  If you feel pain: STOP!Marland Kitchen   Let pain protect you from overdoing it.  Use ice/heat & over-the-counter pain medications to help minimize soreness.  If that is not enough, then use your narcotic pain prescription as needed to remain active.  It is better to take extra pain medications and be more active than to stay bedridden to avoid all pain medications.     Lifting restrictions    Complete by:  As directed   Avoid heavy lifting initially.  Do not push through pain.  You have no specific weight limit - if it hurts to do, DON'T DO IT.   If you feel no pain, you are not injuring anything.  Pain will protect  you from injury.  Coughing and sneezing are far more stressful to your incision than any lifting.  Avoid resuming heavy lifting / intense activity until off all narcotic pain medications.  When ready to exercise more, give yourself 2 weeks to gradually get back to full intense exercise/activity.     Lifting restrictions    Complete by:  As directed   Avoid heavy lifting initially.  Do not push through pain.  You have no specific weight limit - if it hurts to do, DON'T DO IT.   If you feel no pain, you are not injuring anything.  Pain will protect you from injury.  Coughing and sneezing are far more stressful to your incision than any lifting.  Avoid resuming heavy lifting / intense activity until off all narcotic pain medications.  When ready to exercise more, give yourself 2 weeks to gradually get back to full intense exercise/activity.     May shower / Bathe    Complete by:  As directed      May shower / Bathe    Complete by:  As directed      May walk up steps    Complete by:  As directed      May walk up steps    Complete by:  As directed      Sexual Activity Restrictions    Complete by:  As directed   Sexual activity as tolerated.  Do not push through pain.  Pain will protect you from injury.     Sexual Activity Restrictions    Complete by:  As directed   Sexual activity as tolerated.  Do not push through pain.  Pain will protect you from injury.     Walk with assistance    Complete by:  As directed   Walk over an hour a day.  May use a walker/cane/companion to  help with balance and stamina.     Walk with assistance    Complete by:  As directed   Walk over an hour a day.  May use a walker/cane/companion to help with balance and stamina.            Medication List    TAKE these medications        ADVIL 200 MG Caps  Generic drug:  Ibuprofen  Take 400 mg by mouth daily as needed (pain).     atorvastatin 20 MG tablet  Commonly known as:  LIPITOR  Take 20 mg by mouth daily.      calcium carbonate 500 MG chewable tablet  Commonly known as:  TUMS - dosed in mg elemental calcium  Chew 1 tablet by mouth daily as needed for heartburn.     cetirizine 10 MG tablet  Commonly known as:  ZYRTEC  Take 10 mg by mouth daily.     insulin glargine 100 UNIT/ML injection  Commonly known as:  LANTUS  Inject 20 Units into the skin at bedtime.     LINZESS 290 MCG Caps capsule  Generic drug:  Linaclotide  Take 290 mcg by mouth daily.     oxyCODONE 5 MG immediate release tablet  Commonly known as:  Oxy IR/ROXICODONE  Take 1-2 tablets (5-10 mg total) by mouth every 4 (four) hours as needed for moderate pain, severe pain or breakthrough pain.     promethazine 25 MG suppository  Commonly known as:  PHENERGAN  Place 1 suppository (25 mg total) rectally every 6 (six) hours as needed for nausea.     promethazine 12.5 MG tablet  Commonly known as:  PHENERGAN  Take 1-2 tablets (12.5-25 mg total) by mouth every 6 (six) hours as needed for nausea.     Vitamin D3 2000 UNITS Tabs  Take 2,000 mg by mouth daily.           Follow-up Information    Follow up with GROSS,STEVEN C., MD. Schedule an appointment as soon as possible for a visit in 2 weeks.   Specialty:  General Surgery   Why:  To follow up after your operation, To follow up after your hospital stay   Contact information:   [place] [address] [phone]        Signed: Morton Peters, M.D., F.A.C.S. Gastrointestinal and Minimally Invasive Surgery [facility] [address] [place], [place] [address] [phone] Main / Paging   05/04/2015, 4:00 PM

## 2015-05-04 NOTE — Progress Notes (Signed)
Pt has met criteria for discharge. Discharge instructions and prescriptions given

## 2015-05-04 NOTE — Progress Notes (Signed)
Dr Johney Maine notified of swallow exam through Maurice Small, [place] nurse.

## 2015-05-04 NOTE — Progress Notes (Signed)
[facility]  [address] [place], [place] [address] Phone: [phone] FAX: [phone]   Melinda Drake [id number] 11-07-1955   Problem List:   Principal Problem:   Achalasia s/p Heller myotomy/Toupet 05/03/2015 Active Problems:   Diabetes mellitus without complication   Arthritis   1 Day Post-Op  05/03/2015  POST-OPERATIVE DIAGNOSIS: Achalasia  PROCEDURE:  Laparoscopic lysis adhesions 60 minutes. XI ROBOTIC ASSISTED LAPAROSCOPIC HELLER CARDIOMYOTOMY  Toupet (posterior 681EXN) fundoplication x 4cm. Anterior & posterior gastropexy Type 2 mediastinal dissection.    Assessment  Recovering  Plan:  -liquids -esophagram - if OK, then adv to pureed diet -DM control -HTN control -VTE prophylaxis- SCDs, etc -mobilize as tolerated to help recovery  Adin Hector, M.D., F.A.C.S. Gastrointestinal and Minimally Invasive Surgery [facility] [address] [place], [place] [address] [phone] Main / Paging   05/04/2015  Subjective:  No major events Struggled w nausea immediately post-op - gone now Minimal soreness Walking in room Questions about pureed diet  Objective:  Vital signs:  Filed Vitals:   05/03/15 1526 05/03/15 1800 05/03/15 2124 05/04/15 0208  BP: 146/66 175/80 136/58 122/58  Pulse: 77 92 78 83  Temp: 98.1 F (36.7 C) 98.1 F (36.7 C) 99 F (37.2 C) 98.9 F (37.2 C)  TempSrc: Oral Oral Oral Oral  Resp: 16 18 19 18   Height:      Weight:      SpO2: 100% 97% 98% 94%    Last BM Date: 05/02/15  Intake/Output   Yesterday:  09/22 0701 - 09/23 0700 In: 3691.7 [I.V.:3441.7; IV Piggyback:250] Out: 2100 [Urine:2050; Blood:50] This shift:     Bowel function:  Flatus: y  BM: n  Drain: n/a  Physical Exam:  General: Pt awake/alert/oriented x4 in no acute distress.  Smiling, relaxed Eyes: PERRL, normal EOM.  Sclera clear.  No icterus Neuro: CN  II-XII intact w/o focal sensory/motor deficits. Lymph: No head/neck/groin lymphadenopathy Psych:  No delerium/psychosis/paranoia HENT: Normocephalic, Mucus membranes moist.  No thrush Neck: Supple, No tracheal deviation Chest: No chest wall pain w good excursion CV:  Pulses intact.  Regular rhythm MS: Normal AROM mjr joints.  No obvious deformity Abdomen: Soft.  Nondistended.  Mildly tender at incisions only.  No evidence of peritonitis.  No incarcerated hernias. Ext:  SCDs BLE.  No mjr edema.  No cyanosis Skin: No petechiae / purpura  Results:   Labs: Results for orders placed or performed during the hospital encounter of 05/03/15 (from the past 48 hour(s))  Glucose, capillary     Status: None   Collection Time: 05/03/15  5:37 AM  Result Value Ref Range   Glucose-Capillary 81 65 - 99 mg/dL  Glucose, capillary     Status: Abnormal   Collection Time: 05/03/15 11:25 AM  Result Value Ref Range   Glucose-Capillary 151 (H) 65 - 99 mg/dL   Comment 1 Notify RN    Comment 2 Document in Chart   Glucose, capillary     Status: Abnormal   Collection Time: 05/03/15  6:24 PM  Result Value Ref Range   Glucose-Capillary 154 (H) 65 - 99 mg/dL  Glucose, capillary     Status: Abnormal   Collection Time: 05/03/15  9:22 PM  Result Value Ref Range   Glucose-Capillary 119 (H) 65 - 99 mg/dL    Imaging / Studies: No results found.  Medications / Allergies: per chart  Antibiotics: Anti-infectives    Start  Dose/Rate Route Frequency Ordered Stop   05/03/15 1400  ceFAZolin (ANCEF) IVPB 2 g/50 mL premix     2 g 100 mL/hr over 30 Minutes Intravenous 3 times per day 05/03/15 1244 05/04/15 0559   05/03/15 1400  metroNIDAZOLE (FLAGYL) IVPB 500 mg     500 mg 100 mL/hr over 60 Minutes Intravenous Every 6 hours 05/03/15 1244 05/04/15 0759   05/03/15 0536  ceFAZolin (ANCEF) IVPB 2 g/50 mL premix     2 g 100 mL/hr over 30 Minutes Intravenous 60 min pre-op 05/03/15 0536 05/03/15 0745   05/03/15 0536   metroNIDAZOLE (FLAGYL) IVPB 500 mg    Comments:  Pharmacy may adjust dosing strength, interval, or rate of medication as needed for optimal therapy for the patient Send with patient on call to the OR.  Anesthesia to complete antibiotic administration <53min prior to incision per [facility].   500 mg 100 mL/hr over 60 Minutes Intravenous On call to O.R. 05/03/15 0536 05/03/15 0751        Note: Portions of this report may have been transcribed using voice recognition software. Every effort was made to ensure accuracy; however, inadvertent computerized transcription errors may be present.   Any transcriptional errors that result from this process are unintentional.     Adin Hector, M.D., F.A.C.S. Gastrointestinal and Minimally Invasive Surgery [facility] [address] [place], [place] [address] [phone] Main / Paging   05/04/2015  CARE TEAM:  PCP: Lujean Amel, MD  Outpatient Care Team: Patient Care Team: Dibas Dorthy Cooler, MD as PCP - General (Family Medicine)  Inpatient Treatment Team: Treatment Team: Attending Provider: Michael Boston, MD; Technician: Jefm Bryant, NT; Technician: Coralie Carpen, NT

## 2015-12-26 ENCOUNTER — Other Ambulatory Visit (HOSPITAL_COMMUNITY)
Admission: RE | Admit: 2015-12-26 | Discharge: 2015-12-26 | Disposition: A | Discharge status: Home / Self Care | Payer: BLUE CROSS/BLUE SHIELD | Source: Ambulatory Visit | Attending: Obstetrics and Gynecology | Admitting: Obstetrics and Gynecology

## 2015-12-26 ENCOUNTER — Other Ambulatory Visit: Payer: Self-pay | Admitting: Obstetrics and Gynecology

## 2015-12-26 DIAGNOSIS — Z01419 Encounter for gynecological examination (general) (routine) without abnormal findings: Secondary | ICD-10-CM | POA: Insufficient documentation

## 2015-12-28 ENCOUNTER — Other Ambulatory Visit: Payer: Self-pay

## 2015-12-28 DIAGNOSIS — Z1231 Encounter for screening mammogram for malignant neoplasm of breast: Secondary | ICD-10-CM

## 2015-12-28 LAB — CYTOLOGY - PAP

## 2016-01-14 ENCOUNTER — Ambulatory Visit
Admission: RE | Admit: 2016-01-14 | Discharge: 2016-01-14 | Disposition: A | Discharge status: Home / Self Care | Payer: BLUE CROSS/BLUE SHIELD | Source: Ambulatory Visit

## 2016-01-14 DIAGNOSIS — Z1231 Encounter for screening mammogram for malignant neoplasm of breast: Secondary | ICD-10-CM

## 2017-02-25 ENCOUNTER — Other Ambulatory Visit: Payer: Self-pay | Admitting: Obstetrics and Gynecology

## 2017-02-25 ENCOUNTER — Other Ambulatory Visit (HOSPITAL_COMMUNITY)
Admission: RE | Admit: 2017-02-25 | Discharge: 2017-02-25 | Disposition: A | Discharge status: Home / Self Care | Payer: BLUE CROSS/BLUE SHIELD | Source: Ambulatory Visit | Attending: Obstetrics and Gynecology | Admitting: Obstetrics and Gynecology

## 2017-02-25 DIAGNOSIS — Z124 Encounter for screening for malignant neoplasm of cervix: Secondary | ICD-10-CM | POA: Diagnosis present

## 2017-02-27 LAB — CYTOLOGY - PAP
DIAGNOSIS: NEGATIVE
HPV: NOT DETECTED

## 2017-05-19 ENCOUNTER — Other Ambulatory Visit: Payer: Self-pay | Admitting: Family Medicine

## 2017-05-19 DIAGNOSIS — Z1231 Encounter for screening mammogram for malignant neoplasm of breast: Secondary | ICD-10-CM

## 2017-06-04 ENCOUNTER — Ambulatory Visit
Admission: RE | Admit: 2017-06-04 | Discharge: 2017-06-04 | Disposition: A | Discharge status: Home / Self Care | Payer: BLUE CROSS/BLUE SHIELD | Source: Ambulatory Visit | Attending: Family Medicine | Admitting: Family Medicine

## 2017-06-04 DIAGNOSIS — Z1231 Encounter for screening mammogram for malignant neoplasm of breast: Secondary | ICD-10-CM

## 2018-06-15 ENCOUNTER — Other Ambulatory Visit: Payer: Self-pay | Admitting: Family Medicine

## 2018-06-15 DIAGNOSIS — Z1231 Encounter for screening mammogram for malignant neoplasm of breast: Secondary | ICD-10-CM

## 2018-07-27 ENCOUNTER — Ambulatory Visit
Admission: RE | Admit: 2018-07-27 | Discharge: 2018-07-27 | Disposition: A | Discharge status: Home / Self Care | Payer: BLUE CROSS/BLUE SHIELD | Source: Ambulatory Visit | Attending: Family Medicine | Admitting: Family Medicine

## 2018-07-27 DIAGNOSIS — Z1231 Encounter for screening mammogram for malignant neoplasm of breast: Secondary | ICD-10-CM

## 2019-05-16 ENCOUNTER — Other Ambulatory Visit: Payer: Self-pay

## 2019-05-16 ENCOUNTER — Encounter (HOSPITAL_COMMUNITY): Payer: Self-pay | Admitting: Emergency Medicine

## 2019-05-16 ENCOUNTER — Emergency Department (HOSPITAL_COMMUNITY)
Admission: EM | Admit: 2019-05-16 | Discharge: 2019-05-16 | Disposition: A | Discharge status: Home / Self Care | Payer: BC Managed Care – PPO | Attending: Emergency Medicine | Admitting: Emergency Medicine

## 2019-05-16 DIAGNOSIS — E860 Dehydration: Secondary | ICD-10-CM | POA: Diagnosis not present

## 2019-05-16 DIAGNOSIS — Z87891 Personal history of nicotine dependence: Secondary | ICD-10-CM | POA: Diagnosis not present

## 2019-05-16 DIAGNOSIS — R55 Syncope and collapse: Secondary | ICD-10-CM

## 2019-05-16 DIAGNOSIS — E119 Type 2 diabetes mellitus without complications: Secondary | ICD-10-CM | POA: Insufficient documentation

## 2019-05-16 DIAGNOSIS — Z794 Long term (current) use of insulin: Secondary | ICD-10-CM | POA: Insufficient documentation

## 2019-05-16 DIAGNOSIS — Z79899 Other long term (current) drug therapy: Secondary | ICD-10-CM | POA: Diagnosis not present

## 2019-05-16 LAB — CBC WITH DIFFERENTIAL/PLATELET
Abs Immature Granulocytes: 0.04 10*3/uL (ref 0.00–0.07)
Basophils Absolute: 0.1 10*3/uL (ref 0.0–0.1)
Basophils Relative: 1 %
Eosinophils Absolute: 0.1 10*3/uL (ref 0.0–0.5)
Eosinophils Relative: 1 %
HCT: 37.8 % (ref 36.0–46.0)
Hemoglobin: 12 g/dL (ref 12.0–15.0)
Immature Granulocytes: 0 %
Lymphocytes Relative: 17 %
Lymphs Abs: 2.7 10*3/uL (ref 0.7–4.0)
MCH: 30.9 pg (ref 26.0–34.0)
MCHC: 31.7 g/dL (ref 30.0–36.0)
MCV: 97.4 fL (ref 80.0–100.0)
Monocytes Absolute: 0.8 10*3/uL (ref 0.1–1.0)
Monocytes Relative: 5 %
Neutro Abs: 11.7 10*3/uL — ABNORMAL HIGH (ref 1.7–7.7)
Neutrophils Relative %: 76 %
Platelets: 344 10*3/uL (ref 150–400)
RBC: 3.88 MIL/uL (ref 3.87–5.11)
RDW: 14.6 % (ref 11.5–15.5)
WBC: 15.4 10*3/uL — ABNORMAL HIGH (ref 4.0–10.5)
nRBC: 0 % (ref 0.0–0.2)

## 2019-05-16 LAB — BASIC METABOLIC PANEL
Anion gap: 11 (ref 5–15)
BUN: 24 mg/dL — ABNORMAL HIGH (ref 8–23)
CO2: 29 mmol/L (ref 22–32)
Calcium: 10.3 mg/dL (ref 8.9–10.3)
Chloride: 102 mmol/L (ref 98–111)
Creatinine, Ser: 0.91 mg/dL (ref 0.44–1.00)
GFR calc Af Amer: >60 mL/min (ref >60)
GFR calc non Af Amer: >60 mL/min (ref >60)
Glucose, Bld: 143 mg/dL — ABNORMAL HIGH (ref 70–99)
Potassium: 3.4 mmol/L — ABNORMAL LOW (ref 3.5–5.1)
Sodium: 142 mmol/L (ref 135–145)

## 2019-05-16 MED ORDER — POTASSIUM CHLORIDE CRYS ER 20 MEQ PO TBCR
40.0000 meq | EXTENDED_RELEASE_TABLET | Freq: Once | ORAL | Status: AC
  Administered 2019-05-16: 40 meq via ORAL
  Filled 2019-05-16: qty 2

## 2019-05-16 MED ORDER — SODIUM CHLORIDE 0.9 % IV BOLUS
1000.0000 mL | Freq: Once | INTRAVENOUS | Status: AC
  Administered 2019-05-16: 1000 mL via INTRAVENOUS

## 2019-05-16 NOTE — Discharge Instructions (Signed)
Please return for any problem.  Follow-up with your regular care provider as instructed. °

## 2019-05-16 NOTE — ED Notes (Signed)
Pt ambulated to RR .  

## 2019-05-16 NOTE — ED Provider Notes (Signed)
[facility] EMERGENCY DEPARTMENT Provider Note   CSN: [id number] Arrival date & time: 05/16/19  1804     History   Chief Complaint Chief Complaint  Patient presents with  . Loss of Consciousness    HPI Melinda Drake is a 63 y.o. female.     63 year old female with prior medical history as detailed below presents for evaluation following reported syncope.  Patient reports that she was in her normal state of health today.  She walked her normal 2 miles this morning.  After her exercise this morning she felt fine.  She does report that she had a slightly less than drink today than her normal.  This afternoon she took a laxative for her " weekly cleanout".  Shortly thereafter she had a fairly large loose BM.  At 4:30 this afternoon, while she was standing up from a seated position she had a sudden onset of dizziness and lightheadedness.  This was followed by reported syncope that was witnessed by family.  The patient did not fall.  Her family was caught her before she fell.  After sitting back down she regained consciousness very quickly.  She then tried to stand again and was noted again to almost pass out.  She arrives now by EMS, since "her family wanted me to be checked out".  She is currently without symptoms.  She denies any associated chest pain, shortness of breath, nausea, vomiting, fever, abdominal pain, or other specific complaint.  The history is provided by the patient and medical records.  Loss of Consciousness Episode history:  Multiple Most recent episode:  Today Timing:  Rare Progression:  Resolved Chronicity:  New Context: bowel movement and dehydration   Witnessed: yes   Relieved by:  Nothing Worsened by:  Nothing Ineffective treatments:  None tried Associated symptoms: no chest pain and no shortness of breath     Past Medical History:  Diagnosis Date  . Arthritis   . Bronchitis   . Diabetes mellitus without complication ([place])   . Family  history of adverse reaction to anesthesia    N/V   . GERD (gastroesophageal reflux disease)   . 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 History of biopsy 1997   left side neck  . Non Hodgkin's lymphoma ([facility]) 1997  . PONV (postoperative nausea and vomiting)     Patient Active Problem List   Diagnosis Date Noted  . Achalasia s/p Heller myotomy/Toupet 05/03/2015 05/03/2015  . Diabetes mellitus without complication ([place])   . Arthritis     Past Surgical History:  Procedure Laterality Date  . ANKLE FUSION Left 04/27/2013   Procedure: ANKLE FUSION- left;  Surgeon: Newt Minion, MD;  Location: [facility];  Service: Orthopedics;  Laterality: Left;  Left Subtalar Fusion and Talonavicular Fusion  . ESOPHAGEAL MANOMETRY N/A 03/05/2015   Procedure: ESOPHAGEAL MANOMETRY (EM);  Surgeon: Clarene Essex, MD;  Location: [facility];  Service: Endoscopy;  Laterality: N/A;  . EYE SURGERY  ? early 2000's   bilateral cataracts  . LAPAROSCOPIC LYSIS OF ADHESIONS  05/03/2015   Procedure: LAPAROSCOPIC LYSIS OF ADHESIONS;  Surgeon: Michael Boston, MD;  Location: [facility]S;  Service: General;;  . lymphoma excision Left 1996     OB History   No obstetric history on file.      Home Medications    Prior to Admission medications   Medication Sig Start Date End Date Taking? Authorizing Provider  atorvastatin (LIPITOR) 20 MG tablet Take 20 mg by mouth  daily.    [provider]  calcium carbonate (TUMS - DOSED IN MG ELEMENTAL CALCIUM) 500 MG chewable tablet Chew 1 tablet by mouth daily as needed for heartburn.    [provider]  cetirizine (ZYRTEC) 10 MG tablet Take 10 mg by mouth daily.    [provider]  Cholecalciferol (VITAMIN D3) 2000 UNITS TABS Take 2,000 mg by mouth daily.    [provider]  Ibuprofen (ADVIL) 200 MG CAPS Take 400 mg by mouth daily as needed (pain).    [provider]  insulin glargine (LANTUS) 100 UNIT/ML injection Inject 20 Units into the skin at  bedtime.    [provider]  Linaclotide (LINZESS) 290 MCG CAPS capsule Take 290 mcg by mouth daily.    [provider]  oxyCODONE (OXY IR/ROXICODONE) 5 MG immediate release tablet Take 1-2 tablets (5-10 mg total) by mouth every 4 (four) hours as needed for moderate pain, severe pain or breakthrough pain. 05/03/15   Michael Boston, MD  promethazine (PHENERGAN) 12.5 MG tablet Take 1-2 tablets (12.5-25 mg total) by mouth every 6 (six) hours as needed for nausea. 05/03/15   Michael Boston, MD  promethazine (PHENERGAN) 25 MG suppository Place 1 suppository (25 mg total) rectally every 6 (six) hours as needed for nausea. 05/03/15   Michael Boston, MD    Family History Family History  Problem Relation Age of Onset  . Diabetes Mother   . Coronary artery disease Mother   . Hypertension Mother   . Thyroid nodules Sister   . Hypertension Sister     Social History Social History   Tobacco Use  . Smoking status: Former Smoker    Packs/day: 0.25    Types: Cigarettes  . Smokeless tobacco: Never Used  Substance Use Topics  . Alcohol use: Yes    Comment: socially  . Drug use: No     Allergies   Codeine   Review of Systems Review of Systems  Respiratory: Negative for shortness of breath.   Cardiovascular: Positive for syncope. Negative for chest pain.  All other systems reviewed and are negative.    Physical Exam Updated Vital Signs BP 118/62 (BP Location: Right Arm)   Pulse 70   Temp 97.7 F (36.5 C) (Oral)   Resp 11   SpO2 98%   Physical Exam Vitals signs and nursing note reviewed.  Constitutional:      General: She is not in acute distress.    Appearance: Normal appearance. She is well-developed.  HENT:     Head: Normocephalic and atraumatic.  Eyes:     Conjunctiva/sclera: Conjunctivae normal.     Pupils: Pupils are equal, round, and reactive to light.  Neck:     Musculoskeletal: Normal range of motion and neck supple.  Cardiovascular:     Rate and  Rhythm: Normal rate and regular rhythm.     Heart sounds: Normal heart sounds.  Pulmonary:     Effort: Pulmonary effort is normal. No respiratory distress.     Breath sounds: Normal breath sounds.  Abdominal:     General: There is no distension.     Palpations: Abdomen is soft.     Tenderness: There is no abdominal tenderness.  Musculoskeletal: Normal range of motion.        General: No deformity.  Skin:    General: Skin is warm and dry.  Neurological:     General: No focal deficit present.     Mental Status: She is alert and oriented  to person, place, and time. Mental status is at baseline.      ED Treatments / Results  Labs (all labs ordered are listed, but only abnormal results are displayed) Labs Reviewed  CBC WITH DIFFERENTIAL/PLATELET - Abnormal; Notable for the following components:      Result Value   WBC 15.4 (*)    Neutro Abs 11.7 (*)    All other components within normal limits  BASIC METABOLIC PANEL - Abnormal; Notable for the following components:   Potassium 3.4 (*)    Glucose, Bld 143 (*)    BUN 24 (*)    All other components within normal limits    EKG EKG Interpretation  Date/Time:  Monday May 16 2019 18:35:03 EDT Ventricular Rate:  73 PR Interval:    QRS Duration: 95 QT Interval:  422 QTC Calculation: 465 R Axis:   35 Text Interpretation:  Sinus rhythm Confirmed by Dene Gentry [phone]) on 05/16/2019 6:49:46 PM   Radiology No results found.  Procedures Procedures (including critical care time)  Medications Ordered in ED Medications  sodium chloride 0.9 % bolus 1,000 mL (has no administration in time range)     Initial Impression / Assessment and Plan / ED Course  I have reviewed the triage vital signs and the nursing notes.  Pertinent labs & imaging results that were available during my care of the patient were reviewed by me and considered in my medical decision making (see chart for details).        MDM  Screen complete   Melinda Drake was evaluated in Emergency Department on 05/16/2019 for the symptoms described in the history of present illness. She was evaluated in the context of the global COVID-19 pandemic, which necessitated consideration that the patient might be at risk for infection with the SARS-CoV-2 virus that causes COVID-19. Institutional protocols and algorithms that pertain to the evaluation of patients at risk for COVID-19 are in a state of rapid change based on information released by regulatory bodies including the CDC and federal and state organizations. These policies and algorithms were followed during the patient's care in the ED.   Patient is presenting for evaluation of syncopal episode.  Patient's syncope appears to be orthostatic in nature.  Patient is reporting symptoms consistent with likely mild dehydration.  Screening labs obtained in the ED are without significant abnormality.  Patient remains asymptomatic during her ED evaluation.  Following her ED evaluation the patient feels improved.  She desires discharge.  She declines further ED observation and/or admission.  Patient does understand need for close follow-up.  Strict return precautions given and understood.   Final Clinical Impressions(s) / ED Diagnoses   Final diagnoses:  Dehydration  Syncope, unspecified syncope type    ED Discharge Orders    None       Valarie Merino, MD 05/16/19 2234

## 2019-05-16 NOTE — ED Triage Notes (Signed)
Pt had a syncopal episode x 2 today witnessed by family, pt stood up to get something to drink and passed out, when family tried to sit her up she passed out again. No trauma. Pt diaphoretic with slurred speech with EMS but has resolved, LKW at 1640. 88/60 initial bp, last bp 96/72.  Pt took a laxative today for constipation. GCS 15.

## 2019-05-16 NOTE — ED Notes (Signed)
Sister Mardene Celeste: [phone]

## 2019-06-15 ENCOUNTER — Other Ambulatory Visit: Payer: Self-pay | Admitting: Family Medicine

## 2019-06-15 DIAGNOSIS — Z1231 Encounter for screening mammogram for malignant neoplasm of breast: Secondary | ICD-10-CM

## 2019-06-28 ENCOUNTER — Other Ambulatory Visit: Payer: Self-pay | Admitting: Gastroenterology

## 2019-06-28 DIAGNOSIS — K220 Achalasia of cardia: Secondary | ICD-10-CM

## 2019-07-11 ENCOUNTER — Other Ambulatory Visit: Payer: Self-pay | Admitting: Gastroenterology

## 2019-07-11 ENCOUNTER — Ambulatory Visit
Admission: RE | Admit: 2019-07-11 | Discharge: 2019-07-11 | Disposition: A | Discharge status: Home / Self Care | Payer: BC Managed Care – PPO | Source: Ambulatory Visit | Attending: Gastroenterology | Admitting: Gastroenterology

## 2019-07-11 DIAGNOSIS — K220 Achalasia of cardia: Secondary | ICD-10-CM

## 2019-08-10 ENCOUNTER — Ambulatory Visit
Admission: RE | Admit: 2019-08-10 | Discharge: 2019-08-10 | Disposition: A | Discharge status: Home / Self Care | Payer: BC Managed Care – PPO | Source: Ambulatory Visit | Attending: Family Medicine | Admitting: Family Medicine

## 2019-08-10 ENCOUNTER — Other Ambulatory Visit: Payer: Self-pay

## 2019-08-10 DIAGNOSIS — Z1231 Encounter for screening mammogram for malignant neoplasm of breast: Secondary | ICD-10-CM

## 2020-05-08 ENCOUNTER — Other Ambulatory Visit: Payer: Self-pay | Admitting: Neurosurgery

## 2020-05-22 ENCOUNTER — Other Ambulatory Visit: Payer: Self-pay | Admitting: Neurosurgery

## 2020-05-22 DIAGNOSIS — M8580 Other specified disorders of bone density and structure, unspecified site: Secondary | ICD-10-CM

## 2020-06-01 ENCOUNTER — Other Ambulatory Visit: Payer: Self-pay

## 2020-06-01 ENCOUNTER — Ambulatory Visit
Admission: RE | Admit: 2020-06-01 | Discharge: 2020-06-01 | Disposition: A | Discharge status: Home / Self Care | Payer: Medicare Other | Source: Ambulatory Visit | Attending: Neurosurgery | Admitting: Neurosurgery

## 2020-06-01 DIAGNOSIS — M8580 Other specified disorders of bone density and structure, unspecified site: Secondary | ICD-10-CM

## 2020-06-18 NOTE — Progress Notes (Signed)
[id number] - [place], [place] [address] [place] [address] Phone: [phone] Fax: [phone]      Your procedure is scheduled on Thursday November 11th.  Report to [facility] Main Entrance "A" at 9:00 A.M., and check in at the Admitting office.  Call this number if you have problems the morning of surgery:  [phone]  Call [phone] if you have any questions prior to your surgery date Monday-Friday 8am-4pm    Remember:  Do not eat or drink anything after midnight the night before your surgery   Take these medicines the morning of surgery with A SIP OF WATER   atorvastatin (LIPITOR) 20 MG tablet  omeprazole (PRILOSEC) 40 MG capsule   IF NEEDED  acetaminophen (TYLENOL) 325 MG tablet  cetirizine (ZYRTEC) 10 MG tablet    WHAT DO I DO ABOUT MY DIABETES MEDICATION?   Marland Kitchen Do not take oral diabetes medicines (pills) the morning of surgery.  . THE NIGHT BEFORE SURGERY andTHE MORNING OF SURGERY, HOLD Lantus     HOW TO MANAGE YOUR DIABETES BEFORE AND AFTER SURGERY  Why is it important to control my blood sugar before and after surgery? . Improving blood sugar levels before and after surgery helps healing and can limit problems. . A way of improving blood sugar control is eating a healthy diet by: o  Eating less sugar and carbohydrates o  Increasing activity/exercise o  Talking with your doctor about reaching your blood sugar goals . High blood sugars (greater than 180 mg/dL) can raise your risk of infections and slow your recovery, so you will need to focus on controlling your diabetes during the weeks before surgery. . Make sure that the doctor who takes care of your diabetes knows about your planned surgery including the date and location.  How do I manage my blood sugar before surgery? . Check your blood sugar at least 4 times a day, starting 2 days before surgery, to make sure that the level is not too high or low. . Check your blood  sugar the morning of your surgery when you wake up and every 2 hours until you get to the Short Stay unit. o If your blood sugar is less than 70 mg/dL, you will need to treat for low blood sugar: - Do not take insulin. - Treat a low blood sugar (less than 70 mg/dL) with  cup of clear juice (cranberry or apple), 4 glucose tablets, OR glucose gel. - Recheck blood sugar in 15 minutes after treatment (to make sure it is greater than 70 mg/dL). If your blood sugar is not greater than 70 mg/dL on recheck, call [phone] for further instructions. . Report your blood sugar to the short stay nurse when you get to Short Stay.  . If you are admitted to the hospital after surgery: o Your blood sugar will be checked by the staff and you will probably be given insulin after surgery (instead of oral diabetes medicines) to make sure you have good blood sugar levels. o The goal for blood sugar control after surgery is 80-180 mg/dL.    As of today, STOP taking any Aspirin (unless otherwise instructed by your surgeon) Aleve, Naproxen, Ibuprofen, Motrin, Advil, Goody's, BC's, all herbal medications, fish oil, and all vitamins.                   Do not wear jewelry, make up, or nail polish  Do not wear lotions, powders, perfumes, or deodorant.            Do not shave 48 hours prior to surgery.              Do not bring valuables to the hospital.            [facility] is not responsible for any belongings or valuables.  Do NOT Smoke (Tobacco/Vaping) or drink Alcohol 24 hours prior to your procedure If you use a CPAP at night, you may bring all equipment for your overnight stay.   Contacts, glasses, dentures or bridgework may not be worn into surgery.      For patients admitted to the hospital, discharge time will be determined by your treatment team.   Patients discharged the day of surgery will not be allowed to drive home, and someone needs to stay with them for 24 hours.    Special  instructions:   [facility]- Preparing For Surgery  Before surgery, you can play an important role. Because skin is not sterile, your skin needs to be as free of germs as possible. You can reduce the number of germs on your skin by washing with CHG (chlorahexidine gluconate) Soap before surgery.  CHG is an antiseptic cleaner which kills germs and bonds with the skin to continue killing germs even after washing.    Oral Hygiene is also important to reduce your risk of infection.  Remember - BRUSH YOUR TEETH THE MORNING OF SURGERY WITH YOUR REGULAR TOOTHPASTE  Please do not use if you have an allergy to CHG or antibacterial soaps. If your skin becomes reddened/irritated stop using the CHG.  Do not shave (including legs and underarms) for at least 48 hours prior to first CHG shower. It is OK to shave your face.  Please follow these instructions carefully.   1. Shower the NIGHT BEFORE SURGERY and the MORNING OF SURGERY with CHG Soap.   2. If you chose to wash your hair, wash your hair first as usual with your normal shampoo.  3. After you shampoo, rinse your hair and body thoroughly to remove the shampoo.  4. Use CHG as you would any other liquid soap. You can apply CHG directly to the skin and wash gently with a scrungie or a clean washcloth.   5. Apply the CHG Soap to your body ONLY FROM THE NECK DOWN.  Do not use on open wounds or open sores. Avoid contact with your eyes, ears, mouth and genitals (private parts). Wash Face and genitals (private parts)  with your normal soap.   6. Wash thoroughly, paying special attention to the area where your surgery will be performed.  7. Thoroughly rinse your body with warm water from the neck down.  8. DO NOT shower/wash with your normal soap after using and rinsing off the CHG Soap.  9. Pat yourself dry with a CLEAN TOWEL.  10. Wear CLEAN PAJAMAS to bed the night before surgery  11. Place CLEAN SHEETS on your bed the night of your first shower and  DO NOT SLEEP WITH PETS.   Day of Surgery: Wear Clean/Comfortable clothing the morning of surgery Do not apply any deodorants/lotions.   Remember to brush your teeth WITH YOUR REGULAR TOOTHPASTE.   Please read over the following fact sheets that you were given.

## 2020-06-19 ENCOUNTER — Other Ambulatory Visit: Payer: Self-pay

## 2020-06-19 ENCOUNTER — Encounter (HOSPITAL_COMMUNITY)
Admission: RE | Admit: 2020-06-19 | Discharge: 2020-06-19 | Disposition: A | Discharge status: Home / Self Care | Payer: Medicare Other | Source: Ambulatory Visit | Attending: Neurosurgery | Admitting: Neurosurgery

## 2020-06-19 ENCOUNTER — Other Ambulatory Visit (HOSPITAL_COMMUNITY)
Admission: RE | Admit: 2020-06-19 | Discharge: 2020-06-19 | Disposition: A | Discharge status: Home / Self Care | Payer: Medicare Other | Source: Ambulatory Visit | Attending: Neurosurgery | Admitting: Neurosurgery

## 2020-06-19 ENCOUNTER — Encounter (HOSPITAL_COMMUNITY): Payer: Self-pay

## 2020-06-19 DIAGNOSIS — Z01812 Encounter for preprocedural laboratory examination: Secondary | ICD-10-CM | POA: Insufficient documentation

## 2020-06-19 DIAGNOSIS — Z20822 Contact with and (suspected) exposure to covid-19: Secondary | ICD-10-CM | POA: Insufficient documentation

## 2020-06-19 DIAGNOSIS — Z01818 Encounter for other preprocedural examination: Secondary | ICD-10-CM | POA: Insufficient documentation

## 2020-06-19 LAB — TYPE AND SCREEN
ABO/RH(D): A POS
Antibody Screen: NEGATIVE

## 2020-06-19 LAB — BASIC METABOLIC PANEL
Anion gap: 10 (ref 5–15)
BUN: 15 mg/dL (ref 8–23)
CO2: 29 mmol/L (ref 22–32)
Calcium: 10.3 mg/dL (ref 8.9–10.3)
Chloride: 103 mmol/L (ref 98–111)
Creatinine, Ser: 0.55 mg/dL (ref 0.44–1.00)
GFR, Estimated: >60 mL/min (ref >60)
Glucose, Bld: 92 mg/dL (ref 70–99)
Potassium: 4 mmol/L (ref 3.5–5.1)
Sodium: 142 mmol/L (ref 135–145)

## 2020-06-19 LAB — CBC
HCT: 35.2 % — ABNORMAL LOW (ref 36.0–46.0)
Hemoglobin: 11.1 g/dL — ABNORMAL LOW (ref 12.0–15.0)
MCH: 30.5 pg (ref 26.0–34.0)
MCHC: 31.5 g/dL (ref 30.0–36.0)
MCV: 96.7 fL (ref 80.0–100.0)
Platelets: 335 10*3/uL (ref 150–400)
RBC: 3.64 MIL/uL — ABNORMAL LOW (ref 3.87–5.11)
RDW: 13.6 % (ref 11.5–15.5)
WBC: 9.8 10*3/uL (ref 4.0–10.5)
nRBC: 0 % (ref 0.0–0.2)

## 2020-06-19 LAB — GLUCOSE, CAPILLARY: Glucose-Capillary: 94 mg/dL (ref 70–99)

## 2020-06-19 LAB — SURGICAL PCR SCREEN
MRSA, PCR: NEGATIVE
Staphylococcus aureus: NEGATIVE

## 2020-06-19 LAB — HEMOGLOBIN A1C
Hgb A1c MFr Bld: 5.8 % — ABNORMAL HIGH (ref 4.8–5.6)
Mean Plasma Glucose: 119.76 mg/dL

## 2020-06-19 LAB — SARS CORONAVIRUS 2 (TAT 6-24 HRS): SARS Coronavirus 2: NEGATIVE

## 2020-06-19 NOTE — Progress Notes (Addendum)
PCP - Dr. Lujean Amel Cardiologist - Denies  Chest x-ray - NOt indicated EKG - 06/19/20 Stress Test - Denies ECHO - Unsure Cardiac Cath - Denies  Sleep Study - No OSA no study  DM Type I  Fasting Blood Sugar - 75-100 average Checks Blood Sugar __2___ times a day CBG @ PAT appt 94  COVID TEST- 06/19/20  Anesthesia review: No  Patient denies shortness of breath, fever, cough and chest pain at PAT appointment   All instructions explained to the patient, with a verbal understanding of the material. Patient agrees to go over the instructions while at home for a better understanding. Patient also instructed to self quarantine after being tested for COVID-19. The opportunity to ask questions was provided.

## 2020-06-21 ENCOUNTER — Inpatient Hospital Stay (HOSPITAL_COMMUNITY): Payer: Medicare Other | Admitting: Certified Registered Nurse Anesthetist

## 2020-06-21 ENCOUNTER — Encounter (HOSPITAL_COMMUNITY)
Admission: RE | Disposition: A | Discharge status: Home / Self Care | Payer: Self-pay | Source: Home / Self Care | Attending: Neurosurgery

## 2020-06-21 ENCOUNTER — Other Ambulatory Visit: Payer: Self-pay

## 2020-06-21 ENCOUNTER — Inpatient Hospital Stay (HOSPITAL_COMMUNITY): Payer: Medicare Other

## 2020-06-21 ENCOUNTER — Encounter (HOSPITAL_COMMUNITY): Payer: Self-pay | Admitting: Neurosurgery

## 2020-06-21 ENCOUNTER — Observation Stay (HOSPITAL_COMMUNITY)
Admission: RE | Admit: 2020-06-21 | Discharge: 2020-06-22 | Disposition: A | Discharge status: Home / Self Care | Payer: Medicare Other | Attending: Neurosurgery | Admitting: Neurosurgery

## 2020-06-21 DIAGNOSIS — M5416 Radiculopathy, lumbar region: Secondary | ICD-10-CM | POA: Insufficient documentation

## 2020-06-21 DIAGNOSIS — E119 Type 2 diabetes mellitus without complications: Secondary | ICD-10-CM | POA: Insufficient documentation

## 2020-06-21 DIAGNOSIS — I10 Essential (primary) hypertension: Secondary | ICD-10-CM | POA: Insufficient documentation

## 2020-06-21 DIAGNOSIS — M48061 Spinal stenosis, lumbar region without neurogenic claudication: Secondary | ICD-10-CM | POA: Insufficient documentation

## 2020-06-21 DIAGNOSIS — M47816 Spondylosis without myelopathy or radiculopathy, lumbar region: Principal | ICD-10-CM | POA: Insufficient documentation

## 2020-06-21 DIAGNOSIS — M5126 Other intervertebral disc displacement, lumbar region: Secondary | ICD-10-CM | POA: Diagnosis not present

## 2020-06-21 DIAGNOSIS — M5442 Lumbago with sciatica, left side: Secondary | ICD-10-CM | POA: Insufficient documentation

## 2020-06-21 DIAGNOSIS — M4310 Spondylolisthesis, site unspecified: Secondary | ICD-10-CM | POA: Diagnosis not present

## 2020-06-21 DIAGNOSIS — M532X6 Spinal instabilities, lumbar region: Secondary | ICD-10-CM | POA: Diagnosis present

## 2020-06-21 DIAGNOSIS — Z794 Long term (current) use of insulin: Secondary | ICD-10-CM | POA: Diagnosis not present

## 2020-06-21 DIAGNOSIS — Z419 Encounter for procedure for purposes other than remedying health state, unspecified: Secondary | ICD-10-CM

## 2020-06-21 DIAGNOSIS — Z79899 Other long term (current) drug therapy: Secondary | ICD-10-CM | POA: Insufficient documentation

## 2020-06-21 DIAGNOSIS — M545 Low back pain, unspecified: Secondary | ICD-10-CM | POA: Diagnosis present

## 2020-06-21 LAB — GLUCOSE, CAPILLARY
Glucose-Capillary: 126 mg/dL — ABNORMAL HIGH (ref 70–99)
Glucose-Capillary: 126 mg/dL — ABNORMAL HIGH (ref 70–99)
Glucose-Capillary: 181 mg/dL — ABNORMAL HIGH (ref 70–99)
Glucose-Capillary: 196 mg/dL — ABNORMAL HIGH (ref 70–99)

## 2020-06-21 SURGERY — POSTERIOR LUMBAR FUSION 1 LEVEL
Anesthesia: General | Site: Back

## 2020-06-21 MED ORDER — KCL IN DEXTROSE-NACL 20-5-0.45 MEQ/L-%-% IV SOLN: INTRAVENOUS | Status: DC

## 2020-06-21 MED ORDER — PROMETHAZINE HCL 25 MG RE SUPP
25.0000 mg | Freq: Four times a day (QID) | RECTAL | Status: DC | PRN
  Filled 2020-06-21: qty 1

## 2020-06-21 MED ORDER — ACETAMINOPHEN 500 MG PO TABS
ORAL_TABLET | ORAL | Status: AC
  Filled 2020-06-21: qty 2

## 2020-06-21 MED ORDER — DEXAMETHASONE SODIUM PHOSPHATE 10 MG/ML IJ SOLN
INTRAMUSCULAR | Status: AC
  Filled 2020-06-21: qty 1

## 2020-06-21 MED ORDER — PROPOFOL 500 MG/50ML IV EMUL
INTRAVENOUS | Status: DC | PRN
  Administered 2020-06-21: 25 ug/kg/min via INTRAVENOUS

## 2020-06-21 MED ORDER — ACETAMINOPHEN 500 MG PO TABS
1000.0000 mg | ORAL_TABLET | Freq: Once | ORAL | Status: AC
  Administered 2020-06-21: 1000 mg via ORAL

## 2020-06-21 MED ORDER — PHENYLEPHRINE 40 MCG/ML (10ML) SYRINGE FOR IV PUSH (FOR BLOOD PRESSURE SUPPORT)
PREFILLED_SYRINGE | INTRAVENOUS | Status: AC
  Filled 2020-06-21: qty 10

## 2020-06-21 MED ORDER — OXYCODONE HCL 5 MG PO TABS: 5.0000 mg | ORAL_TABLET | ORAL | Status: DC | PRN

## 2020-06-21 MED ORDER — ROCURONIUM BROMIDE 10 MG/ML (PF) SYRINGE
PREFILLED_SYRINGE | INTRAVENOUS | Status: DC | PRN
  Administered 2020-06-21 (×2): 30 mg via INTRAVENOUS
  Administered 2020-06-21: 10 mg via INTRAVENOUS
  Administered 2020-06-21: 20 mg via INTRAVENOUS

## 2020-06-21 MED ORDER — MENTHOL 3 MG MT LOZG: 1.0000 | LOZENGE | OROMUCOSAL | Status: DC | PRN

## 2020-06-21 MED ORDER — DOCUSATE SODIUM 100 MG PO CAPS
100.0000 mg | ORAL_CAPSULE | Freq: Two times a day (BID) | ORAL | Status: DC
  Administered 2020-06-21 – 2020-06-22 (×2): 100 mg via ORAL
  Filled 2020-06-21 (×2): qty 1

## 2020-06-21 MED ORDER — INSULIN LISPRO (1 UNIT DIAL) 100 UNIT/ML (KWIKPEN): 5.0000 [IU] | PEN_INJECTOR | Freq: Every day | SUBCUTANEOUS | Status: DC | PRN

## 2020-06-21 MED ORDER — PHENYLEPHRINE 40 MCG/ML (10ML) SYRINGE FOR IV PUSH (FOR BLOOD PRESSURE SUPPORT)
PREFILLED_SYRINGE | INTRAVENOUS | Status: DC | PRN
  Administered 2020-06-21 (×2): 80 ug via INTRAVENOUS

## 2020-06-21 MED ORDER — LIDOCAINE-EPINEPHRINE 1 %-1:100000 IJ SOLN
INTRAMUSCULAR | Status: AC
  Filled 2020-06-21: qty 1

## 2020-06-21 MED ORDER — ONDANSETRON HCL 4 MG/2ML IJ SOLN
INTRAMUSCULAR | Status: DC | PRN
  Administered 2020-06-21: 4 mg via INTRAVENOUS

## 2020-06-21 MED ORDER — FLEET ENEMA 7-19 GM/118ML RE ENEM: 1.0000 | ENEMA | Freq: Once | RECTAL | Status: DC | PRN

## 2020-06-21 MED ORDER — VITAMIN D 25 MCG (1000 UNIT) PO TABS
2000.0000 [IU] | ORAL_TABLET | Freq: Every day | ORAL | Status: DC
  Administered 2020-06-22: 2000 [IU] via ORAL
  Filled 2020-06-21: qty 2

## 2020-06-21 MED ORDER — CEFAZOLIN SODIUM-DEXTROSE 2-4 GM/100ML-% IV SOLN
2.0000 g | Freq: Three times a day (TID) | INTRAVENOUS | Status: AC
  Administered 2020-06-21 – 2020-06-22 (×2): 2 g via INTRAVENOUS
  Filled 2020-06-21 (×2): qty 100

## 2020-06-21 MED ORDER — PROPOFOL 1000 MG/100ML IV EMUL
INTRAVENOUS | Status: AC
  Filled 2020-06-21: qty 100

## 2020-06-21 MED ORDER — CHLORHEXIDINE GLUCONATE CLOTH 2 % EX PADS: 6.0000 | MEDICATED_PAD | Freq: Once | CUTANEOUS | Status: DC

## 2020-06-21 MED ORDER — LIDOCAINE 2% (20 MG/ML) 5 ML SYRINGE
INTRAMUSCULAR | Status: AC
  Filled 2020-06-21: qty 5

## 2020-06-21 MED ORDER — ROCURONIUM BROMIDE 10 MG/ML (PF) SYRINGE
PREFILLED_SYRINGE | INTRAVENOUS | Status: AC
  Filled 2020-06-21: qty 10

## 2020-06-21 MED ORDER — PANTOPRAZOLE SODIUM 40 MG IV SOLR: 40.0000 mg | Freq: Every day | INTRAVENOUS | Status: DC

## 2020-06-21 MED ORDER — FENTANYL CITRATE (PF) 250 MCG/5ML IJ SOLN
INTRAMUSCULAR | Status: AC
  Filled 2020-06-21: qty 5

## 2020-06-21 MED ORDER — CEFAZOLIN SODIUM-DEXTROSE 2-4 GM/100ML-% IV SOLN
2.0000 g | INTRAVENOUS | Status: AC
  Administered 2020-06-21: 2 g via INTRAVENOUS
  Filled 2020-06-21: qty 100

## 2020-06-21 MED ORDER — ALUM & MAG HYDROXIDE-SIMETH 200-200-20 MG/5ML PO SUSP: 30.0000 mL | Freq: Four times a day (QID) | ORAL | Status: DC | PRN

## 2020-06-21 MED ORDER — IRBESARTAN 150 MG PO TABS
75.0000 mg | ORAL_TABLET | Freq: Every day | ORAL | Status: DC
  Administered 2020-06-21: 75 mg via ORAL
  Filled 2020-06-21: qty 1

## 2020-06-21 MED ORDER — THROMBIN 5000 UNITS EX SOLR
OROMUCOSAL | Status: DC | PRN
  Administered 2020-06-21: 5 mL via TOPICAL

## 2020-06-21 MED ORDER — METHOCARBAMOL 500 MG PO TABS
ORAL_TABLET | ORAL | Status: AC
  Filled 2020-06-21: qty 1

## 2020-06-21 MED ORDER — CHLORHEXIDINE GLUCONATE 0.12 % MT SOLN
15.0000 mL | Freq: Once | OROMUCOSAL | Status: AC
  Administered 2020-06-21: 15 mL via OROMUCOSAL
  Filled 2020-06-21: qty 15

## 2020-06-21 MED ORDER — PANTOPRAZOLE SODIUM 40 MG PO TBEC
40.0000 mg | DELAYED_RELEASE_TABLET | Freq: Every day | ORAL | Status: DC
  Administered 2020-06-21 – 2020-06-22 (×2): 40 mg via ORAL
  Filled 2020-06-21 (×2): qty 1

## 2020-06-21 MED ORDER — HYDROCODONE-ACETAMINOPHEN 5-325 MG PO TABS
2.0000 | ORAL_TABLET | ORAL | Status: DC | PRN
  Administered 2020-06-21 – 2020-06-22 (×4): 2 via ORAL
  Filled 2020-06-21 (×4): qty 2

## 2020-06-21 MED ORDER — SUGAMMADEX SODIUM 200 MG/2ML IV SOLN
INTRAVENOUS | Status: DC | PRN
  Administered 2020-06-21: 200 mg via INTRAVENOUS

## 2020-06-21 MED ORDER — BUPIVACAINE HCL (PF) 0.5 % IJ SOLN
INTRAMUSCULAR | Status: AC
  Filled 2020-06-21: qty 30

## 2020-06-21 MED ORDER — SODIUM CHLORIDE 0.9% FLUSH
3.0000 mL | Freq: Two times a day (BID) | INTRAVENOUS | Status: DC
  Administered 2020-06-21: 3 mL via INTRAVENOUS

## 2020-06-21 MED ORDER — GABAPENTIN 300 MG PO CAPS
300.0000 mg | ORAL_CAPSULE | Freq: Two times a day (BID) | ORAL | Status: DC
  Administered 2020-06-21 – 2020-06-22 (×2): 300 mg via ORAL
  Filled 2020-06-21 (×2): qty 1

## 2020-06-21 MED ORDER — PHENYLEPHRINE HCL-NACL 10-0.9 MG/250ML-% IV SOLN
INTRAVENOUS | Status: DC | PRN
  Administered 2020-06-21: 25 ug/min via INTRAVENOUS

## 2020-06-21 MED ORDER — PROPOFOL 10 MG/ML IV BOLUS
INTRAVENOUS | Status: DC | PRN
  Administered 2020-06-21: 150 mg via INTRAVENOUS

## 2020-06-21 MED ORDER — LACTATED RINGERS IV SOLN: INTRAVENOUS | Status: DC | PRN

## 2020-06-21 MED ORDER — ONDANSETRON HCL 4 MG/2ML IJ SOLN
INTRAMUSCULAR | Status: AC
  Filled 2020-06-21: qty 2

## 2020-06-21 MED ORDER — LACTATED RINGERS IV SOLN: INTRAVENOUS | Status: DC

## 2020-06-21 MED ORDER — 0.9 % SODIUM CHLORIDE (POUR BTL) OPTIME
TOPICAL | Status: DC | PRN
  Administered 2020-06-21: 1000 mL

## 2020-06-21 MED ORDER — PHENOL 1.4 % MT LIQD: 1.0000 | OROMUCOSAL | Status: DC | PRN

## 2020-06-21 MED ORDER — ATORVASTATIN CALCIUM 10 MG PO TABS
20.0000 mg | ORAL_TABLET | Freq: Every day | ORAL | Status: DC
  Administered 2020-06-21: 20 mg via ORAL
  Filled 2020-06-21: qty 2

## 2020-06-21 MED ORDER — BUPIVACAINE HCL (PF) 0.5 % IJ SOLN
INTRAMUSCULAR | Status: DC | PRN
  Administered 2020-06-21: 5 mL

## 2020-06-21 MED ORDER — ONDANSETRON HCL 4 MG/2ML IJ SOLN
4.0000 mg | Freq: Four times a day (QID) | INTRAMUSCULAR | Status: DC | PRN
  Administered 2020-06-21: 4 mg via INTRAVENOUS
  Filled 2020-06-21: qty 2

## 2020-06-21 MED ORDER — LIDOCAINE-EPINEPHRINE 1 %-1:100000 IJ SOLN
INTRAMUSCULAR | Status: DC | PRN
  Administered 2020-06-21: 5 mL

## 2020-06-21 MED ORDER — INSULIN GLARGINE 100 UNIT/ML ~~LOC~~ SOLN
20.0000 [IU] | Freq: Every day | SUBCUTANEOUS | Status: DC
  Administered 2020-06-21: 20 [IU] via SUBCUTANEOUS
  Filled 2020-06-21 (×2): qty 0.2

## 2020-06-21 MED ORDER — ACETAMINOPHEN 325 MG PO TABS: 650.0000 mg | ORAL_TABLET | Freq: Four times a day (QID) | ORAL | Status: DC | PRN

## 2020-06-21 MED ORDER — OXYCODONE HCL 5 MG PO TABS
ORAL_TABLET | ORAL | Status: AC
  Filled 2020-06-21: qty 1

## 2020-06-21 MED ORDER — METHOCARBAMOL 500 MG PO TABS
500.0000 mg | ORAL_TABLET | Freq: Four times a day (QID) | ORAL | Status: DC | PRN
  Administered 2020-06-21 (×2): 500 mg via ORAL
  Filled 2020-06-21: qty 1

## 2020-06-21 MED ORDER — BISACODYL 10 MG RE SUPP: 10.0000 mg | Freq: Every day | RECTAL | Status: DC | PRN

## 2020-06-21 MED ORDER — ONDANSETRON HCL 4 MG PO TABS: 4.0000 mg | ORAL_TABLET | ORAL | Status: DC | PRN

## 2020-06-21 MED ORDER — PROMETHAZINE HCL 25 MG PO TABS: 12.5000 mg | ORAL_TABLET | Freq: Four times a day (QID) | ORAL | Status: DC | PRN

## 2020-06-21 MED ORDER — FENTANYL CITRATE (PF) 100 MCG/2ML IJ SOLN
25.0000 ug | INTRAMUSCULAR | Status: DC | PRN
  Administered 2020-06-21 (×2): 50 ug via INTRAVENOUS

## 2020-06-21 MED ORDER — SODIUM CHLORIDE 0.9 % IV SOLN: 250.0000 mL | INTRAVENOUS | Status: DC

## 2020-06-21 MED ORDER — FENTANYL CITRATE (PF) 100 MCG/2ML IJ SOLN
INTRAMUSCULAR | Status: AC
  Filled 2020-06-21: qty 2

## 2020-06-21 MED ORDER — BUPIVACAINE LIPOSOME 1.3 % IJ SUSP
20.0000 mL | INTRAMUSCULAR | Status: AC
  Administered 2020-06-21: 20 mL
  Filled 2020-06-21: qty 20

## 2020-06-21 MED ORDER — FENTANYL CITRATE (PF) 250 MCG/5ML IJ SOLN
INTRAMUSCULAR | Status: DC | PRN
Start: 2020-06-21 — End: 2020-06-21
  Administered 2020-06-21 (×3): 50 ug via INTRAVENOUS
  Administered 2020-06-21: 100 ug via INTRAVENOUS

## 2020-06-21 MED ORDER — LORATADINE 10 MG PO TABS
10.0000 mg | ORAL_TABLET | Freq: Every day | ORAL | Status: DC
  Administered 2020-06-22: 10 mg via ORAL
  Filled 2020-06-21: qty 1

## 2020-06-21 MED ORDER — INSULIN ASPART 100 UNIT/ML ~~LOC~~ SOLN: 0.0000 [IU] | Freq: Three times a day (TID) | SUBCUTANEOUS | Status: DC

## 2020-06-21 MED ORDER — SODIUM CHLORIDE 0.9% FLUSH: 3.0000 mL | INTRAVENOUS | Status: DC | PRN

## 2020-06-21 MED ORDER — DEXAMETHASONE SODIUM PHOSPHATE 10 MG/ML IJ SOLN
INTRAMUSCULAR | Status: DC | PRN
  Administered 2020-06-21: 5 mg via INTRAVENOUS

## 2020-06-21 MED ORDER — OXYCODONE HCL 5 MG PO TABS
5.0000 mg | ORAL_TABLET | ORAL | Status: DC | PRN
  Administered 2020-06-21: 5 mg via ORAL
  Filled 2020-06-21: qty 1

## 2020-06-21 MED ORDER — ONDANSETRON HCL 4 MG PO TABS: 4.0000 mg | ORAL_TABLET | Freq: Four times a day (QID) | ORAL | Status: DC | PRN

## 2020-06-21 MED ORDER — POLYETHYLENE GLYCOL 3350 17 G PO PACK: 17.0000 g | PACK | Freq: Every day | ORAL | Status: DC | PRN

## 2020-06-21 MED ORDER — ORAL CARE MOUTH RINSE: 15.0000 mL | Freq: Once | OROMUCOSAL | Status: AC

## 2020-06-21 MED ORDER — OXYCODONE HCL 5 MG PO TABS
5.0000 mg | ORAL_TABLET | ORAL | Status: DC | PRN
  Administered 2020-06-21: 5 mg via ORAL

## 2020-06-21 MED ORDER — ACETAMINOPHEN 650 MG RE SUPP: 650.0000 mg | RECTAL | Status: DC | PRN

## 2020-06-21 MED ORDER — ONDANSETRON HCL 4 MG/2ML IJ SOLN
4.0000 mg | Freq: Once | INTRAMUSCULAR | Status: AC | PRN
  Administered 2020-06-21: 4 mg via INTRAVENOUS

## 2020-06-21 MED ORDER — ZOLPIDEM TARTRATE 5 MG PO TABS: 5.0000 mg | ORAL_TABLET | Freq: Every evening | ORAL | Status: DC | PRN

## 2020-06-21 MED ORDER — MIDAZOLAM HCL 5 MG/5ML IJ SOLN
INTRAMUSCULAR | Status: DC | PRN
  Administered 2020-06-21: 2 mg via INTRAVENOUS

## 2020-06-21 MED ORDER — HYDROMORPHONE HCL 1 MG/ML IJ SOLN: 0.5000 mg | INTRAMUSCULAR | Status: DC | PRN

## 2020-06-21 MED ORDER — ONDANSETRON HCL 4 MG/2ML IJ SOLN: 4.0000 mg | INTRAMUSCULAR | Status: DC | PRN

## 2020-06-21 MED ORDER — LIDOCAINE 2% (20 MG/ML) 5 ML SYRINGE
INTRAMUSCULAR | Status: DC | PRN
  Administered 2020-06-21: 60 mg via INTRAVENOUS

## 2020-06-21 MED ORDER — PROPOFOL 10 MG/ML IV BOLUS
INTRAVENOUS | Status: AC
  Filled 2020-06-21: qty 20

## 2020-06-21 MED ORDER — METHOCARBAMOL 1000 MG/10ML IJ SOLN
500.0000 mg | Freq: Four times a day (QID) | INTRAVENOUS | Status: DC | PRN
  Filled 2020-06-21: qty 5

## 2020-06-21 MED ORDER — MIDAZOLAM HCL 2 MG/2ML IJ SOLN
INTRAMUSCULAR | Status: AC
  Filled 2020-06-21: qty 2

## 2020-06-21 MED ORDER — ACETAMINOPHEN 325 MG PO TABS: 650.0000 mg | ORAL_TABLET | ORAL | Status: DC | PRN

## 2020-06-21 MED ORDER — THROMBIN 5000 UNITS EX SOLR
CUTANEOUS | Status: AC
  Filled 2020-06-21: qty 5000

## 2020-06-21 MED ORDER — HEMOSTATIC AGENTS (NO CHARGE) OPTIME
TOPICAL | Status: DC | PRN
  Administered 2020-06-21: 1 via TOPICAL

## 2020-06-21 SURGICAL SUPPLY — 77 items
BASKET BONE COLLECTION (BASKET) ×3 IMPLANT
BLADE CLIPPER SURG (BLADE) IMPLANT
BONE CANC CHIPS 20CC PCAN1/4 (Bone Implant) ×3 IMPLANT
BUR MATCHSTICK NEURO 3.0 LAGG (BURR) ×3 IMPLANT
BUR PRECISION FLUTE 5.0 (BURR) ×3 IMPLANT
CAGE PLIF 8X9X23-12 LUMBAR (Cage) ×6 IMPLANT
CANISTER SUCT 3000ML PPV (MISCELLANEOUS) ×3 IMPLANT
CARTRIDGE OIL MAESTRO DRILL (MISCELLANEOUS) ×1 IMPLANT
CHIPS CANC BONE 20CC PCAN1/4 (Bone Implant) ×1 IMPLANT
CNTNR URN SCR LID CUP LEK RST (MISCELLANEOUS) ×1 IMPLANT
CONT SPEC 4OZ STRL OR WHT (MISCELLANEOUS) ×2
COVER BACK TABLE 60X90IN (DRAPES) ×3 IMPLANT
COVER WAND RF STERILE (DRAPES) IMPLANT
DECANTER SPIKE VIAL GLASS SM (MISCELLANEOUS) ×3 IMPLANT
DERMABOND ADHESIVE PROPEN (GAUZE/BANDAGES/DRESSINGS) ×2
DERMABOND ADVANCED (GAUZE/BANDAGES/DRESSINGS)
DERMABOND ADVANCED .7 DNX12 (GAUZE/BANDAGES/DRESSINGS) IMPLANT
DERMABOND ADVANCED .7 DNX6 (GAUZE/BANDAGES/DRESSINGS) ×1 IMPLANT
DIFFUSER DRILL AIR PNEUMATIC (MISCELLANEOUS) ×3 IMPLANT
DRAPE C-ARM 42X72 X-RAY (DRAPES) ×3 IMPLANT
DRAPE C-ARMOR (DRAPES) ×3 IMPLANT
DRAPE LAPAROTOMY 100X72X124 (DRAPES) ×3 IMPLANT
DRAPE SURG 17X23 STRL (DRAPES) ×3 IMPLANT
DRSG OPSITE POSTOP 4X6 (GAUZE/BANDAGES/DRESSINGS) ×3 IMPLANT
DURAPREP 26ML APPLICATOR (WOUND CARE) ×3 IMPLANT
ELECT BLADE 4.0 EZ CLEAN MEGAD (MISCELLANEOUS) ×3
ELECT REM PT RETURN 9FT ADLT (ELECTROSURGICAL) ×3
ELECTRODE BLDE 4.0 EZ CLN MEGD (MISCELLANEOUS) ×1 IMPLANT
ELECTRODE REM PT RTRN 9FT ADLT (ELECTROSURGICAL) ×1 IMPLANT
EVACUATOR 1/8 PVC DRAIN (DRAIN) IMPLANT
GAUZE 4X4 16PLY RFD (DISPOSABLE) IMPLANT
GAUZE SPONGE 4X4 12PLY STRL (GAUZE/BANDAGES/DRESSINGS) ×3 IMPLANT
GLOVE BIO SURGEON STRL SZ8 (GLOVE) ×6 IMPLANT
GLOVE BIOGEL PI IND STRL 7.5 (GLOVE) ×2 IMPLANT
GLOVE BIOGEL PI IND STRL 8 (GLOVE) ×2 IMPLANT
GLOVE BIOGEL PI IND STRL 8.5 (GLOVE) ×2 IMPLANT
GLOVE BIOGEL PI INDICATOR 7.5 (GLOVE) ×4
GLOVE BIOGEL PI INDICATOR 8 (GLOVE) ×4
GLOVE BIOGEL PI INDICATOR 8.5 (GLOVE) ×4
GLOVE ECLIPSE 8.0 STRL XLNG CF (GLOVE) ×6 IMPLANT
GLOVE EXAM NITRILE XL STR (GLOVE) IMPLANT
GLOVE SURG SS PI 7.0 STRL IVOR (GLOVE) ×12 IMPLANT
GOWN STRL REUS W/ TWL LRG LVL3 (GOWN DISPOSABLE) IMPLANT
GOWN STRL REUS W/ TWL XL LVL3 (GOWN DISPOSABLE) ×3 IMPLANT
GOWN STRL REUS W/TWL 2XL LVL3 (GOWN DISPOSABLE) ×6 IMPLANT
GOWN STRL REUS W/TWL LRG LVL3 (GOWN DISPOSABLE)
GOWN STRL REUS W/TWL XL LVL3 (GOWN DISPOSABLE) ×6
HEMOSTAT POWDER KIT SURGIFOAM (HEMOSTASIS) ×3 IMPLANT
KIT BASIN OR (CUSTOM PROCEDURE TRAY) ×3 IMPLANT
KIT INFUSE X SMALL 1.4CC (Orthopedic Implant) ×3 IMPLANT
KIT POSITION SURG JACKSON T1 (MISCELLANEOUS) ×3 IMPLANT
KIT TURNOVER KIT B (KITS) ×3 IMPLANT
MILL MEDIUM DISP (BLADE) ×3 IMPLANT
NEEDLE HYPO 25X1 1.5 SAFETY (NEEDLE) ×3 IMPLANT
NEEDLE SPNL 18GX3.5 QUINCKE PK (NEEDLE) IMPLANT
NS IRRIG 1000ML POUR BTL (IV SOLUTION) ×3 IMPLANT
OIL CARTRIDGE MAESTRO DRILL (MISCELLANEOUS) ×3
PACK LAMINECTOMY NEURO (CUSTOM PROCEDURE TRAY) ×3 IMPLANT
PAD ARMBOARD 7.5X6 YLW CONV (MISCELLANEOUS) ×9 IMPLANT
PATTIES SURGICAL .5 X.5 (GAUZE/BANDAGES/DRESSINGS) IMPLANT
PATTIES SURGICAL .5 X1 (DISPOSABLE) IMPLANT
PATTIES SURGICAL 1X1 (DISPOSABLE) IMPLANT
ROD RELINE-O LORD 5.5X40 (Rod) ×6 IMPLANT
SCREW LOCK RELINE 5.5 TULIP (Screw) ×12 IMPLANT
SCREW RELINE-O POLY 6.5X40 (Screw) ×12 IMPLANT
SPONGE LAP 4X18 RFD (DISPOSABLE) IMPLANT
SPONGE SURGIFOAM ABS GEL 100 (HEMOSTASIS) ×3 IMPLANT
STAPLER SKIN PROX WIDE 3.9 (STAPLE) IMPLANT
SUT VIC AB 1 CT1 18XBRD ANBCTR (SUTURE) ×1 IMPLANT
SUT VIC AB 1 CT1 8-18 (SUTURE) ×2
SUT VIC AB 2-0 CT1 18 (SUTURE) ×3 IMPLANT
SUT VIC AB 3-0 SH 8-18 (SUTURE) ×3 IMPLANT
SYR 5ML LL (SYRINGE) ×3 IMPLANT
TOWEL GREEN STERILE (TOWEL DISPOSABLE) ×3 IMPLANT
TOWEL GREEN STERILE FF (TOWEL DISPOSABLE) ×3 IMPLANT
TRAY FOLEY MTR SLVR 16FR STAT (SET/KITS/TRAYS/PACK) ×3 IMPLANT
WATER STERILE IRR 1000ML POUR (IV SOLUTION) ×3 IMPLANT

## 2020-06-21 NOTE — Progress Notes (Signed)
Orthopedic Tech Progress Note Patient Details:  Melinda Drake [birth year] [id number] Patient has Brace. Patient ID: Melinda Drake, female   DOB: 09/14/1955, 64 y.o.   MRN: [id number]   [name] 06/21/2020, 4:40 PM

## 2020-06-21 NOTE — Transfer of Care (Signed)
Immediate Anesthesia Transfer of Care Note  Patient: Melinda Drake  Procedure(s) Performed: Lumbar 4-5 Posterior lumbar interbody fusion (N/A Back)  Patient Location: PACU  Anesthesia Type:General  Level of Consciousness: drowsy  Airway & Oxygen Therapy: Patient Spontanous Breathing and Patient connected to face mask oxygen  Post-op Assessment: Report given to RN and Post -op Vital signs reviewed and stable  Post vital signs: Reviewed and stable  Last Vitals:  Vitals Value Taken Time  BP 163/83 06/21/20 1431  Temp    Pulse 64 06/21/20 1433  Resp 14 06/21/20 1433  SpO2 100 % 06/21/20 1433  Vitals shown include unvalidated device data.  Last Pain:  Vitals:   06/21/20 0930  TempSrc:   PainSc: 3          Complications: No complications documented.

## 2020-06-21 NOTE — Anesthesia Postprocedure Evaluation (Signed)
Anesthesia Post Note  Patient: Melinda Drake  Procedure(s) Performed: Lumbar 4-5 Posterior lumbar interbody fusion (N/A Back)     Patient location during evaluation: PACU Anesthesia Type: General Level of consciousness: awake and alert Pain management: pain level controlled Vital Signs Assessment: post-procedure vital signs reviewed and stable Respiratory status: spontaneous breathing, nonlabored ventilation, respiratory function stable and patient connected to nasal cannula oxygen Cardiovascular status: blood pressure returned to baseline and stable Postop Assessment: no apparent nausea or vomiting Anesthetic complications: no   No complications documented.  Last Vitals:  Vitals:   06/21/20 1615 06/21/20 1953  BP: (!) 166/72 (!) 157/64  Pulse: 73 81  Resp: 15 18  Temp:  36.8 C  SpO2: 97% 100%    Last Pain:  Vitals:   06/21/20 1953  TempSrc: Oral  PainSc:                  Emmalou Hunger P Jahnya Trindade

## 2020-06-21 NOTE — Brief Op Note (Signed)
06/21/2020  2:22 PM  PATIENT:  Melinda Drake  64 y.o. 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, scoliosis, lumbago, radiculopathy  PROCEDURE:  Procedure(s) with comments: Lumbar 4-5 Posterior lumbar interbody fusion (N/A) - 3C/RM 21 with PEEK cages, autograft, pedicle screw fixation, posterolateral arthrodesis  SURGEON:  Surgeon(s) and Role:    Erline Levine, MD - Primary    * Dawley, Theodoro Doing, DO - Assisting  PHYSICIAN ASSISTANT:   ASSISTANTS: Poteat, RN   ANESTHESIA:   general  EBL:  150 mL   BLOOD ADMINISTERED:none  DRAINS: none   LOCAL MEDICATIONS USED:  MARCAINE    and LIDOCAINE   SPECIMEN:  No Specimen  DISPOSITION OF SPECIMEN:  N/A  COUNTS:  YES  TOURNIQUET:  * No tourniquets in log *  DICTATION: Patient is 64 year old woman with 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 She has a severe bilateral L5 radiculopathies. It was elected to take her to surgery for decompression and fusion at this level.   Procedure: Patient was placed in a prone position on the Atlantic table after smooth and uncomplicated induction of general endotracheal anesthesia. Her low back was prepped and draped in usual sterile fashion with betadine scrub and DuraPrep after marking relevant anatomy with C arm. Area of incision was infiltrated with local lidocaine. Incision was made to the lumbodorsal fascia was incised and exposure was performed of the L4 through L5 spinous processes laminae facet joint and transverse processes. Intraoperative x-ray was obtained which confirmed correct orientation. A total laminectomy of L4 was performed with disarticulation of the facet joints at this level and thorough decompression was performed of both L4 and L5 nerve roots along with the common dural tube. This decompression was more  involved than would be typical of that performed for PLIF alone and included painstaking dissection of adherent ligament compressing the thecal sac and wide decompression of all neural elements. A thorough discectomy was initially performed on the left with preparation of the endplates for grafting a trial spacer was placed this level and a thorough discectomy was performed on the right as well. Bone autograft was packed within the interspace bilaterally along with extra small BMP kit and autograft. Bilateral median 8 x 9 x 23 mm 12 degree  peek cages were packed with BMP and autograft and was inserted the interspace and countersunk appropriately along with 10 cc of morselized bone autograft. The posterolateral region was extensively decorticated and pedicle probes were placed at L4 and L5 bilaterally. Intraoperative fluoroscopy confirmed correct orientationin the AP and lateral plane. 40 x 6.5 mm pedicle screws were placed at L5 bilaterally and 40 x 6.5 mm screws placed at L4 bilaterally final x-rays demonstrated well-positioned interbody grafts and pedicle screw fixation. A 40 mm lordotic rod was placed on the right and a 40 mm rod was placed on the left locked down in situ and the posterolateral region was packed with the remaining BMP and 10 cc bone autograft and allograft chips  bilaterally. The wound was irrigated. Long-acting Marcaine was injected in the deep musculature.  Fascia was closed with 1 Vicryl sutures skin edges were reapproximated 2 and 3-0 Vicryl sutures. The wound was dressed with Dermabond and an occlusive dressing.  the patient was extubated in the operating room and taken to recovery in stable satisfactory condition. She tolerated the operation well counts were correct at the  end of the case.   PLAN OF CARE: Admit to inpatient   PATIENT DISPOSITION:  PACU - hemodynamically stable.   Delay start of Pharmacological VTE agent (>24hrs) due to surgical blood loss or risk of bleeding:  yes

## 2020-06-21 NOTE — Op Note (Signed)
06/21/2020  2:22 PM  PATIENT:  Melinda Drake  64 y.o. 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, scoliosis, lumbago, radiculopathy  PROCEDURE:  Procedure(s) with comments: Lumbar 4-5 Posterior lumbar interbody fusion (N/A) - 3C/RM 21 with PEEK cages, autograft, pedicle screw fixation, posterolateral arthrodesis  SURGEON:  Surgeon(s) and Role:    Erline Levine, MD - Primary    * Dawley, Theodoro Doing, DO - Assisting  PHYSICIAN ASSISTANT:   ASSISTANTS: Poteat, RN   ANESTHESIA:   general  EBL:  150 mL   BLOOD ADMINISTERED:none  DRAINS: none   LOCAL MEDICATIONS USED:  MARCAINE    and LIDOCAINE   SPECIMEN:  No Specimen  DISPOSITION OF SPECIMEN:  N/A  COUNTS:  YES  TOURNIQUET:  * No tourniquets in log *  DICTATION: Patient is 64 year old woman with 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 She has a severe bilateral L5 radiculopathies. It was elected to take her to surgery for decompression and fusion at this level.   Procedure: Patient was placed in a prone position on the Atlantic table after smooth and uncomplicated induction of general endotracheal anesthesia. Her low back was prepped and draped in usual sterile fashion with betadine scrub and DuraPrep after marking relevant anatomy with C arm. Area of incision was infiltrated with local lidocaine. Incision was made to the lumbodorsal fascia was incised and exposure was performed of the L4 through L5 spinous processes laminae facet joint and transverse processes. Intraoperative x-ray was obtained which confirmed correct orientation. A total laminectomy of L4 was performed with disarticulation of the facet joints at this level and thorough decompression was performed of both L4 and L5 nerve roots along with the common dural tube. This decompression was more  involved than would be typical of that performed for PLIF alone and included painstaking dissection of adherent ligament compressing the thecal sac and wide decompression of all neural elements. A thorough discectomy was initially performed on the left with preparation of the endplates for grafting a trial spacer was placed this level and a thorough discectomy was performed on the right as well. Bone autograft was packed within the interspace bilaterally along with extra small BMP kit and autograft. Bilateral median 8 x 9 x 23 mm 12 degree  peek cages were packed with BMP and autograft and was inserted the interspace and countersunk appropriately along with 10 cc of morselized bone autograft. The posterolateral region was extensively decorticated and pedicle probes were placed at L4 and L5 bilaterally. Intraoperative fluoroscopy confirmed correct orientationin the AP and lateral plane. 40 x 6.5 mm pedicle screws were placed at L5 bilaterally and 40 x 6.5 mm screws placed at L4 bilaterally final x-rays demonstrated well-positioned interbody grafts and pedicle screw fixation. A 40 mm lordotic rod was placed on the right and a 40 mm rod was placed on the left locked down in situ and the posterolateral region was packed with the remaining BMP and 10 cc bone autograft and allograft chips  bilaterally. The wound was irrigated. Long-acting Marcaine was injected in the deep musculature.  Fascia was closed with 1 Vicryl sutures skin edges were reapproximated 2 and 3-0 Vicryl sutures. The wound was dressed with Dermabond and an occlusive dressing.  the patient was extubated in the operating room and taken to recovery in stable satisfactory condition. She tolerated the operation well counts were correct at the  end of the case.   PLAN OF CARE: Admit to inpatient   PATIENT DISPOSITION:  PACU - hemodynamically stable.   Delay start of Pharmacological VTE agent (>24hrs) due to surgical blood loss or risk of bleeding:  yes

## 2020-06-21 NOTE — Anesthesia Procedure Notes (Signed)
Procedure Name: Intubation Date/Time: 06/21/2020 10:35 AM Performed by: Candis Shine, CRNA Pre-anesthesia Checklist: Patient identified, Emergency Drugs available, Suction available and Patient being monitored Patient Re-evaluated:Patient Re-evaluated prior to induction Oxygen Delivery Method: Circle System Utilized Preoxygenation: Pre-oxygenation with 100% oxygen Induction Type: IV induction Ventilation: Mask ventilation without difficulty Laryngoscope Size: Mac and 3 Grade View: Grade II Tube type: Oral Tube size: 7.0 mm Number of attempts: 1 Airway Equipment and Method: Stylet Placement Confirmation: ETT inserted through vocal cords under direct vision,  positive ETCO2 and breath sounds checked- equal and bilateral Secured at: 22 cm Tube secured with: Tape Dental Injury: Teeth and Oropharynx as per pre-operative assessment

## 2020-06-21 NOTE — H&P (Signed)
Patient ID:   [id number] Patient: Melinda Drake  Date of Birth: 1956/04/21 Visit Type: Office Visit   Date: 05/07/2020 12:15 PM Provider: Marchia Meiers. Vertell Limber MD   This 64 year old female presents for back pain.  HISTORY OF PRESENT ILLNESS: 1.  back pain  Melinda Drake is a 64 year old female who was referred for neurosurgical evaluation by Dr. Ron Agee for low back pain and bilateral leg pain.  The patient reports that 5-6 months ago she had a sudden onset of low back pain that radiated into her bilateral lower extremities.  She stated that she has had a history of sciatica in the past but has resolved and her current symptoms are different from what she has experienced before.  She states that her low back pain is bilateral and the pain radiates down her buttocks into her posterior legs stopping just superior to the ankles and is occasionally accompanied with numbness.  She reports that her back pain is currently a 3/10 and it is a 7 8/10 at its worst.  She reports that her left lower extremity is slightly worse than her right.  She further reports some difficulty with balance and states that she feels that she has "not steady."  She reports that her symptoms increase with standing for more than 2-3 minutes and with walking.  Her symptoms are slightly relieved with sitting and lying down.  She has attempted gabapentin but ceased its use due to side effects.  Naproxen and Tylenol p.r.n. With minimal relief.  She has done a couple months of physical therapy for which she states that her symptoms worsened.  She has had 2 epidural steroid injections with no change in her symptoms  Medications:  Gabapentin 300 mg t.i.d. (she is currently not taking due to side effects and reading a warning that diabetic should not take this medication).  Naproxen 500 mg b.i.d.(helps minimally).  Tylenol 650 mg t.i.d.  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    PHYSICAL EXAM:  Vitals Date Temp F BP Pulse Ht In Wt Lb BMI BSA Pain Score 05/07/2020  136/77 87 64 159 27.29  7/10   PHYSICAL EXAM Details General Level of Distress: no acute distress Overall Appearance: normal    Cardiovascular Cardiac: regular rate and rhythm without murmur  Respiratory Lungs: clear to auscultation  Neurological Recent and Remote Memory: normal Attention Span and Concentration:   normal Language: normal Fund of Knowledge: normal  Right Left Sensation: normal normal Upper Extremity Coordination: normal normal  Lower Extremity Coordination: normal normal  Musculoskeletal Gait and Station: normal  Right Left Upper Extremity Muscle Strength: normal normal Lower Extremity Muscle Strength: normal normal Upper Extremity Muscle Tone:  normal normal Lower Extremity Muscle Tone: normal normal   Motor Strength Upper and lower extremity motor strength was tested in the clinically pertinent muscles.     Deep Tendon Reflexes  Right Left Biceps: normal normal Triceps: normal normal Brachioradialis: normal normal Patellar: normal normal Achilles: normal normal  Sensory Sensation was tested at L1 to S1.   Cranial Nerves II. Optic Nerve/Visual Fields: normal III. Oculomotor:  normal IV. Trochlear: normal V. Trigeminal: normal VI. Abducens: normal VII. Facial: normal VIII. Acoustic/Vestibular: normal IX. Glossopharyngeal: normal X. Vagus: normal XI. Spinal Accessory: normal XII. Hypoglossal: normal  Motor and other  Tests Lhermittes: negative Rhomberg: negative    Right Left Hoffman's: normal normal Clonus: normal normal Babinski: normal normal SLR: negative negative Patrick's Corky Sox): negative negative Toe Walk: normal normal Toe Lift: normal normal Heel Walk: normal normal SI Joint: nontender nontender     DIAGNOSTIC RESULTS:  Noncontrast lumbar MRI 624/21, four view lumbar radiographs, AP lateral scoliosis radiographs.  She has a levoscoliosis mostly affecting her lumbar region.  Multilevel lumbar spondylosis, worse at L4-L5.  The L4-L5 level has a herniated disc, severe facet arthropathy with significant spinal stenosis and an anterior listhesis.  Anterior listhesis neutral 10 mm, extension 9.5 mm, flexion 11.3 mm.    IMPRESSION:  The patient is having significant intractable low back pain and bilateral lumbar radiculopathy that has been refractory to conservative treatment.  She has a mobile anterior listhesis at L4 on L5, a herniated nucleus pulposus at the L4-L5 level, severe facet arthropathy, all of which are causing severe spinal stenosis and foraminal stenosis.  I have recommended patient undergo decompression and fusion at the L4-L5 level.  PLAN: Tentative plan is to proceed with L4-5 decompression and fusion surgery.  Surgery will consist of either a PLIF or XLIF.  After calculations have been made, a decision on which approach will be made.  She is scheduled for June 21, 2020 at [facility].  Risks and benefits were discussed in detail with the patient and she wishes to proceed with surgery.  She has been given a prescription for an LSO brace from [employer] and detailed patient education was performed.  Her questions were answered. Ibuprofen 800 mg. Patient will undergo a DEXA scan (last DEXA scan reported to be 8-10 years ago). She will follow-up with [place] in the clinic 3 weeks after discharge from the hospital with AP/lateral radiographs.  Orders: Office  Procedures/Services: Assessment Service Comments M85.80 Bone Density   Diagnostic Procedures: Assessment Procedure M41.20 Scoliosis- AP/Lat M54.42 Lumbar Spine- AP/Lat M54.42 Lumbar Spine- AP/Lat/Flex/Ex Instruction(s)/Education: Assessment Instruction I10 Lifestyle education [phone] Dietary management education, guidance, and counseling Miscellaneous: Assessment  M54.42 LSO Brace  Completed Orders (this encounter) Order Details Reason Side Interpretation Result Initial Treatment Date Region Lifestyle education Patient will follow up with Primary Care Physician.       Dietary management education, guidance, and counseling Encouraged patient to eat well balanced diet.       Lumbar Spine- AP/Lat/Flex/Ex      05/07/2020 All Levels to All Levels Scoliosis- AP/Lat      05/07/2020 All Levels to All Levels  Assessment/Plan  # Detail Type Description  1. Assessment Levoscoliosis (M41.80).     2. Assessment Lumbar spondylosis (M47.816).     3. Assessment Degenerative lumbar spinal stenosis (M48.061).     4. Assessment Herniated nucleus pulposus, lumbar (M51.26).     5. Assessment Anterolisthesis (M43.10).     6. Assessment Osteopenia determined by x-ray (M85.80).  Plan Orders Bone Density.     7. 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  Plan Orders LSO Brace.     8. Assessment Lumbago with sciatica, right side (M54.41).     9. Assessment Other chronic pain (G89.29).    10. Assessment Scoliosis (and kyphoscoliosis), idiopathic (M41.20).    11. Assessment Essential (primary) hypertension (I10).    12. Assessment Body mass  index (BMI) 27.0-27.9, adult (P38.25).  Plan Orders Today's instructions / counseling include(s) Dietary management education, guidance, and counseling. Clinical information/comments: Encouraged patient to eat well  balanced diet.       Pain Management Plan Pain Scale: 7/10. Method: Numeric Pain Intensity Scale. Location: back. Onset: 05/07/2020. Duration: varies. Quality: discomforting. Pain management follow-up plan of care: Patient will continue medication management.Marland Kitchen     MEDICATIONS PRESCRIBED TODAY    Rx Quantity Refills IBUPROFEN 800 mg  90 0           Provider:  Marchia Meiers. Vertell Limber MD  05/09/2020 08:09 AM    Dictation edited by: Marchia Meiers. Vertell Limber    CC Providers: [facility] [address], [place] [id number]               Electronically signed by Marchia Meiers Vertell Limber MD on 05/09/2020 08:09 AM

## 2020-06-21 NOTE — Anesthesia Preprocedure Evaluation (Addendum)
Anesthesia Evaluation  Patient identified by MRN, date of birth, ID band Patient awake    Reviewed: Allergy & Precautions, NPO status , Patient's Chart, lab work & pertinent test results  History of Anesthesia Complications (+) PONV and history of anesthetic complications  Airway Mallampati: III  TM Distance: >3 FB Neck ROM: Full    Dental no notable dental hx.    Pulmonary former smoker,    Pulmonary exam normal breath sounds clear to auscultation       Cardiovascular negative cardio ROS Normal cardiovascular exam Rhythm:Regular Rate:Normal  ECG: NSR, rate 81   Neuro/Psych negative neurological ROS  negative psych ROS   GI/Hepatic Neg liver ROS, GERD  Medicated and Controlled,  Endo/Other  diabetes, Insulin DependentNon Hodgkin's lymphoma  Renal/GU negative Renal ROS     Musculoskeletal  (+) Arthritis ,   Abdominal   Peds  Hematology  (+) anemia , HLD   Anesthesia Other Findings Anterolisthesis  Reproductive/Obstetrics                           Anesthesia Physical Anesthesia Plan  ASA: III  Anesthesia Plan: General   Post-op Pain Management:    Induction: Intravenous  PONV Risk Score and Plan: 4 or greater and Ondansetron, Dexamethasone, Midazolam, Treatment may vary due to age or medical condition and Propofol infusion  Airway Management Planned: Oral ETT  Additional Equipment:   Intra-op Plan:   Post-operative Plan: Extubation in OR  Informed Consent: I have reviewed the patients History and Physical, chart, labs and discussed the procedure including the risks, benefits and alternatives for the proposed anesthesia with the patient or authorized representative who has indicated his/her understanding and acceptance.     Dental advisory given  Plan Discussed with: CRNA  Anesthesia Plan Comments:        Anesthesia Quick Evaluation

## 2020-06-21 NOTE — Interval H&P Note (Signed)
History and Physical Interval Note:  06/21/2020 11:17 AM  Melinda Drake  has presented today for surgery, with the diagnosis of Anterolisthesis.  The various methods of treatment have been discussed with the patient and family. After consideration of risks, benefits and other options for treatment, the patient has consented to  Procedure(s) with comments: Lumbar 4-5 Posterior lumbar interbody fusion (N/A) - 3C/RM 21 as a surgical intervention.  The patient's history has been reviewed, patient examined, no change in status, stable for surgery.  I have reviewed the patient's chart and labs.  Questions were answered to the patient's satisfaction.     Peggyann Shoals

## 2020-06-22 DIAGNOSIS — M47816 Spondylosis without myelopathy or radiculopathy, lumbar region: Secondary | ICD-10-CM | POA: Diagnosis not present

## 2020-06-22 LAB — GLUCOSE, CAPILLARY: Glucose-Capillary: 91 mg/dL (ref 70–99)

## 2020-06-22 NOTE — Evaluation (Signed)
Occupational Therapy Evaluation Patient Details Name: Melinda Drake MRN: [id number] DOB: Feb 22, 1956 Today's Date: 06/22/2020    History of Present Illness 64 yo female s/p L4-5 fusion. PMH including arthritis, DM, and Non Hodgkin's lymphoma (1997).   Clinical Impression   PTA, pt was living with her sister (who is disabled) and was independent. Currently, pt requires Supervision for ADLs and functional mobility. Provided education and handout on back precautions, bed mobility, grooming, brace management, LB ADLs, toileting, and tub transfer with shower seat; pt demonstrated understanding. Pt presenting with slight balance deficits and decreased cognition; but feel this is due to pain medication. Pt reporting "I feel a bit dopey. I don't feel quite myself yet." Denies any leg pain. Answered all pt questions. Recommend dc home once medically stable per physician. All acute OT needs met and will sign off. Thank you.    Follow Up Recommendations  No OT follow up    Equipment Recommendations  None recommended by OT    Recommendations for Other Services PT consult     Precautions / Restrictions Precautions Precautions: Back Precaution Booklet Issued: Yes (comment) Precaution Comments: Reviewed back precautions and handout Required Braces or Orthoses: Spinal Brace Spinal Brace: Lumbar corset;Applied in sitting position      Mobility Bed Mobility Overal bed mobility: Needs Assistance Bed Mobility: Sidelying to Sit;Rolling Rolling: Supervision Sidelying to sit: Supervision       General bed mobility comments: Supervision for safety. Education on log roll technique    Transfers Overall transfer level: Needs assistance   Transfers: Sit to/from Stand Sit to Stand: Supervision         General transfer comment: Supervision for safety    Balance Overall balance assessment: Mild deficits observed, not formally tested                                          ADL either performed or assessed with clinical judgement   ADL Overall ADL's : Needs assistance/impaired                                       General ADL Comments: Pt performing ADLs and functional mobiltiy at Supervision level. Providing education on back precautions, brace management, bed mobility, LB ADLs, toileting, adn tub trasnfer. Pt demonstrating understanding. As session progressed, pt demosntrating more confusion and poor balance (self correcting); reports she feels more dopey from pain meds     Vision         Perception     Praxis      Pertinent Vitals/Pain Pain Assessment: Faces Faces Pain Scale: Hurts little more Pain Location: Back Pain Descriptors / Indicators: Constant;Discomfort Pain Intervention(s): Monitored during session;Repositioned     Hand Dominance     Extremity/Trunk Assessment Upper Extremity Assessment Upper Extremity Assessment: Overall WFL for tasks assessed   Lower Extremity Assessment Lower Extremity Assessment: Defer to PT evaluation   Cervical / Trunk Assessment Cervical / Trunk Assessment: Other exceptions Cervical / Trunk Exceptions: Back sx   Communication Communication Communication: No difficulties   Cognition Arousal/Alertness: Awake/alert Behavior During Therapy: WFL for tasks assessed/performed Overall Cognitive Status: Impaired/Different from baseline  General Comments: Pt requiring increased time and repeating certain questions. Feel this is due to pain medication. Pt reporting "I feel very dopey"   General Comments       Exercises     Shoulder Instructions      Home Living Family/patient expects to be discharged to:: Private residence Living Arrangements: Other relatives (Sister) Available Help at Discharge: Available PRN/intermittently;Family Type of Home: House (townhome)       Home Layout: Two level;Bed/bath upstairs Alternate Level  Stairs-Number of Steps: flight Alternate Level Stairs-Rails: Left Bathroom Shower/Tub: Teacher, early years/pre: Standard     Home Equipment: Shower seat;Bedside commode          Prior Functioning/Environment Level of Independence: Independent                 OT Problem List: Decreased activity tolerance;Impaired balance (sitting and/or standing);Decreased knowledge of use of DME or AE;Decreased knowledge of precautions      OT Treatment/Interventions:      OT Goals(Current goals can be found in the care plan section) Acute Rehab OT Goals Patient Stated Goal: Go home today OT Goal Formulation: All assessment and education complete, DC therapy  OT Frequency:     Barriers to D/C:            Co-evaluation              AM-PAC OT "6 Clicks" Daily Activity     Outcome Measure Help from another person eating meals?: A Little Help from another person taking care of personal grooming?: A Little Help from another person toileting, which includes using toliet, bedpan, or urinal?: A Little Help from another person bathing (including washing, rinsing, drying)?: A Little Help from another person to put on and taking off regular upper body clothing?: A Little Help from another person to put on and taking off regular lower body clothing?: A Little 6 Click Score: 18   End of Session Equipment Utilized During Treatment: Back brace Nurse Communication: Mobility status  Activity Tolerance: Patient tolerated treatment well Patient left: in chair;with call bell/phone within reach;with nursing/sitter in room (at sink with NT)  OT Visit Diagnosis: Unsteadiness on feet (R26.81);Other abnormalities of gait and mobility (R26.89);Muscle weakness (generalized) (M62.81)                Time: 6967-8938 OT Time Calculation (min): 28 min Charges:  OT General Charges $OT Visit: 1 Visit OT Evaluation $OT Eval Low Complexity: 1 Low OT Treatments $Self Care/Home Management :  8-22 mins  Ludger Bones MSOT, OTR/L Acute Rehab Pager: [phone] Office: [place] 06/22/2020, 8:43 AM

## 2020-06-22 NOTE — Discharge Summary (Signed)
Physician Discharge Summary  Patient ID: Melinda Drake MRN: [id number] DOB/AGE: 1956-03-01 64 y.o.  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 The patient tolerated the procedure well and was taken to the recovery room and then to the floor in stable condition. The hospital course was routine. There were no complications. The wound remained clean dry and intact. Pt had appropriate back soreness. No complaints of leg pain or new N/T/W. The patient remained afebrile with stable vital signs, and tolerated a regular diet. The patient continued to increase activities, and pain was well controlled with oral pain medications.   Consults: None  Significant Diagnostic Studies: radiology: X-Ray  Treatments: surgery: Lumbar 4-5 Posterior lumbar interbody fusion (N/A) - with PEEK cages, autograft, pedicle screw fixation, posterolateral arthrodesis  Discharge Exam: Blood pressure (!) 113/59, pulse 78, temperature 98.4 F (36.9 C), temperature source Oral, resp. rate 18, height 5\' 3"  (1.6 m), weight 72.2 kg, SpO2 100 %. Physical Exam: She is A/ X 4, conversant and in good spirits. MAEW with good power. Incision is CDI.    Disposition: Discharge disposition: 01-Home or Self Care       Discharge Instructions    Incentive spirometry RT   Complete by: As directed      Allergies as of 06/22/2020      Reactions   Codeine Nausea Only      Medication List    STOP taking these medications   ibuprofen 800 MG tablet Commonly known as: ADVIL   naproxen  500 MG tablet Commonly known as: NAPROSYN   oxyCODONE 5 MG immediate release tablet Commonly known as: Oxy IR/ROXICODONE     TAKE these medications   acetaminophen 325 MG tablet Commonly known as: TYLENOL Take 650 mg by mouth every 6 (six) hours as needed for moderate pain.   atorvastatin 20 MG tablet Commonly known as: LIPITOR Take 20 mg by mouth daily.   cetirizine 10 MG tablet Commonly known as: ZYRTEC Take 10 mg by mouth daily as needed for allergies.   gabapentin 300 MG capsule Commonly known as: NEURONTIN Take 300 mg by mouth 2 (two) times daily.   HumaLOG KwikPen 100 UNIT/ML KwikPen Generic drug: insulin lispro Inject 5-6 Units into the skin daily as needed (blood sugar over 100 in the morning or 180 at night).   insulin glargine 100 UNIT/ML injection Commonly known as: LANTUS Inject 20 Units into the skin at bedtime.   irbesartan 75 MG tablet Commonly known as: AVAPRO Take 75 mg by mouth daily.   Linzess 290 MCG Caps capsule Generic drug: linaclotide Take 290 mcg by mouth daily as needed.   omeprazole 40 MG capsule Commonly known as: PRILOSEC Take 40 mg by mouth daily.   promethazine 25 MG suppository Commonly known as: PHENERGAN Place 1 suppository (25 mg total) rectally every 6 (six) hours as needed for nausea.   promethazine 12.5 MG tablet Commonly known as: PHENERGAN Take 1-2 tablets (12.5-25 mg total) by mouth every 6 (six) hours as needed for nausea.   Vitamin D3 50 MCG (2000 UT) Tabs Take 2,000 mg by mouth daily.        Signed: Mady Haagensen  Glenford Peers, [place], NP-C 06/22/2020, 8:14 AM

## 2020-06-22 NOTE — Discharge Instructions (Signed)
Wound Care REMOVE OUTER DRESSING IN 2-3 DAYS Leave incision open to air. You may shower. Do not scrub directly on incision.  Do not put any creams, lotions, or ointments on incision. The Glue will fall off in 2-3 weeks Activity Walk each and every day, increasing distance each day. No lifting greater than 5 lbs.  Avoid bending, arching, and twisting. No driving for 2 weeks; may ride as a passenger locally. If provided with back brace, wear when out of bed.  It is not necessary to wear in bed. Diet Resume your normal diet.   Call Your Doctor If Any of These Occur Redness, drainage, or swelling at the wound.  Temperature greater than 101 degrees. Severe pain not relieved by pain medication. Incision starts to come apart. Follow Up Appt Call today for appointment in 2-3 weeks ([phone]) or for problems.  If you have any hardware placed in your spine, you will need an x-ray before your appointment.

## 2020-06-22 NOTE — Care Management CC44 (Signed)
Condition Code 44 Documentation Completed  Patient Details  Name: Melinda Drake MRN: [id number] Date of Birth: 1956/04/20   Condition Code 44 given:  Yes Patient signature on Condition Code 44 notice:  Yes Documentation of 2 MD's agreement:  Yes Code 44 added to claim:  Yes    Angelita Ingles, RN 06/22/2020, 8:54 AM

## 2020-06-22 NOTE — Evaluation (Signed)
Physical Therapy Evaluation Patient Details Name: Melinda Drake MRN: [id number] DOB: 04/08/1956 Today's Date: 06/22/2020   History of Present Illness  64 yo female s/p L4-5 fusion. PMh including arthritis, DM, and Non Hodgkin's lymphoma (1997).  Clinical Impression  Patient received in chair, pleasant but still seeming a bit loopy from medications- she states "I feel better, that pain medicine can really make me silly, but I'm still not myself". Had short attention span and impaired safety awareness during session, but feel that most impairments are likely from pain meds. Verbally reviewed and demonstrated appropriate back precautions and discussed donning/doffing brace, then able to mobilize on a S-min guard level with RW. Needed repetitive VC for safe proximity to RW as well as upright posture with gait due to habitual trunk flexion and short attention span today. Unable to maintain back precautions with forward ascent of steps with U rail (kept going into too much trunk flexion), but was able to ascend steps more safely with sideways ascent with BUEs on rail and VC/TC to help avoid trunk rotation/twisting. Able to safely descend steps forward with U rail, however specifically educated her that she is not to attempt steps at home unless her nephew is present to help her. Discussed and we agreed that her nephew can help her down the steps in the morning, and she can spend her day downstairs sharing her sister's downstairs bathroom while her nephew is at work, then she could return upstairs with her nephew's help in the evening until HHPT can address safety/precautions with stair navigation in more depth. Left up in recliner with all needs met. Will definitely require HHPT moving forward.     Follow Up Recommendations Home health PT;Supervision for mobility/OOB    Equipment Recommendations  Rolling walker with 5" wheels    Recommendations for Other Services       Precautions / Restrictions  Precautions Precautions: Back Precaution Booklet Issued: Yes (comment) Precaution Comments: Reviewed back precautions and handout Required Braces or Orthoses: Spinal Brace Spinal Brace: Lumbar corset;Applied in sitting position Restrictions Weight Bearing Restrictions: No      Mobility  Bed Mobility Overal bed mobility: Needs Assistance             General bed mobility comments: deferred log rolling so we could focus more on stair training- per OT notes, did well with log rolling and bed mobility overall    Transfers Overall transfer level: Needs assistance Equipment used: Rolling walker (2 wheeled) Transfers: Sit to/from Stand Sit to Stand: Supervision         General transfer comment: S for safety, repetitive VC for technique to maintain back precautions  Ambulation/Gait Ambulation/Gait assistance: Supervision Gait Distance (Feet): 150 Feet Assistive device: Rolling walker (2 wheeled) Gait Pattern/deviations: Step-through pattern;Trunk flexed;Drifts right/left Gait velocity: decreased   General Gait Details: kept habitually bending at her back during gait despite repetitive cues for upright posture and proximity to device; actually did well avoiding twisting with RW during dynamic standing tasks  Stairs Stairs: Yes Stairs assistance: Min guard Stair Management: One rail Left Number of Stairs: 20 (10 steps x2) General stair comments: unable to maintain back precautions with forwards ascent and U rail- did much better with sideways step ascent with VC/TC to limit twisting; able to descend steps with U railing forwards with min guard while maintaining precautions  Wheelchair Mobility    Modified Rankin (Stroke Patients Only)       Balance Overall balance assessment: Mild deficits observed, not formally tested                                             Pertinent Vitals/Pain Pain Assessment: Faces Faces Pain Scale: Hurts a little bit Pain  Location: Back Pain Descriptors / Indicators: Constant;Discomfort Pain Intervention(s): Limited activity within patient's tolerance;Monitored during session    Home Living                        Prior Function                 Hand Dominance        Extremity/Trunk Assessment   Upper Extremity Assessment Upper Extremity Assessment: Defer to OT evaluation    Lower Extremity Assessment Lower Extremity Assessment: Generalized weakness    Cervical / Trunk Assessment Cervical / Trunk Assessment: Other exceptions Cervical / Trunk Exceptions: s/p lumbar surgery  Communication      Cognition Arousal/Alertness: Awake/alert;Suspect due to medications Behavior During Therapy: [facility] for tasks assessed/performed Overall Cognitive Status: Impaired/Different from baseline                                 General Comments: still needs increased processing time and repeating herself/repeating questions; states "I feel better but I'm still not myself". Possibly due to pain medication.      General Comments      Exercises     Assessment/Plan    PT Assessment Patient needs continued PT services  PT Problem List Decreased strength;Decreased knowledge of use of DME;Decreased activity tolerance;Decreased safety awareness;Decreased balance;Decreased knowledge of precautions;Pain;Decreased mobility;Decreased coordination       PT Treatment Interventions DME instruction;Balance training;Gait training;Stair training;Functional mobility training;Patient/family education;Therapeutic activities;Therapeutic exercise    PT Goals (Current goals can be found in the Care Plan section)  Acute Rehab PT Goals Patient Stated Goal: Go home today PT Goal Formulation: With patient Time For Goal Achievement: 07/06/20 Potential to Achieve Goals: Fair    Frequency Min 5X/week   Barriers to discharge        Co-evaluation               AM-PAC PT "6 Clicks" Mobility   Outcome Measure Help needed turning from your back to your side while in a flat bed without using bedrails?: A Little Help needed moving from lying on your back to sitting on the side of a flat bed without using bedrails?: A Little Help needed moving to and from a bed to a chair (including a wheelchair)?: A Little Help needed standing up from a chair using your arms (e.g., wheelchair or bedside chair)?: A Little Help needed to walk in hospital room?: A Little Help needed climbing 3-5 steps with a railing? : A Little 6 Click Score: 18    End of Session Equipment Utilized During Treatment: Back brace Activity Tolerance: Patient tolerated treatment well Patient left: in chair;with call bell/phone within reach Nurse Communication: Mobility status PT Visit Diagnosis: Difficulty in walking, not elsewhere classified (R26.2);Muscle weakness (generalized) (M62.81);Pain;Other abnormalities of gait and mobility (R26.89) Pain - Right/Left:  (back) Pain - part of body:  (back)    Time: 1130-1201 PT Time Calculation (min) (ACUTE ONLY): 31 min   Charges:   PT Evaluation $PT Eval Moderate Complexity: 1 Mod PT Treatments $Gait Training: 8-22 mins        Windell Norfolk, DPT, PN1   Supplemental Physical Therapist [place]    Pager [phone] Acute Rehab Office [phone]

## 2020-06-22 NOTE — Care Management Obs Status (Signed)
[facility] NOTIFICATION   Patient Details  Name: Munirah Doerner MRN: [id number] Date of Birth: 05/24/1956   Medicare Observation Status Notification Given:  Yes    Angelita Ingles, RN 06/22/2020, 8:53 AM

## 2020-06-22 NOTE — Progress Notes (Addendum)
Subjective: Patient reports that she is doing great and has had a resolution of her preoperative symptoms.   Objective: Vital signs in last 24 hours: Temp:  [97.7 F (36.5 C)-98.6 F (37 C)] 98.6 F (37 C) (11/12 0410) Pulse Rate:  [73-91] 91 (11/12 0410) Resp:  [8-20] 18 (11/12 0410) BP: (135-176)/(57-83) 141/67 (11/12 0410) SpO2:  [97 %-100 %] 100 % (11/12 0410) Weight:  [72.2 kg] 72.2 kg (11/11 0904)  Intake/Output from previous day: 11/11 0701 - 11/12 0700 In: 1500 [I.V.:1500] Out: 285 [Urine:135; Blood:150] Intake/Output this shift: No intake/output data recorded.  Physical Exam: She is A/ X 4, conversant and in good spirits. MAEW with good power. Incision is CDI.    Lab Results: Recent Labs    06/19/20 1150  WBC 9.8  HGB 11.1*  HCT 35.2*  PLT 335   BMET Recent Labs    06/19/20 1150  NA 142  K 4.0  CL 103  CO2 29  GLUCOSE 92  BUN 15  CREATININE 0.55  CALCIUM 10.3    Studies/Results: DG Lumbar Spine 2-3 Views  Result Date: 06/21/2020 CLINICAL DATA:  L4-5 PLIF EXAM: LUMBAR SPINE - 2-3 VIEW; DG C-ARM 1-60 MIN COMPARISON:  MRI 02/02/2020 FINDINGS: Two low resolution intraoperative spot views of the lumbar spine. Total fluoroscopy time was 45 seconds. The images demonstrate fixating screws at L4 and L5 with interbody device. IMPRESSION: Intraoperative fluoroscopic assistance provided during lumbar spine surgery. Electronically Signed   By: Donavan Foil M.D.   On: 06/21/2020 17:51   DG C-Arm 1-60 Min  Result Date: 06/21/2020 CLINICAL DATA:  L4-5 PLIF EXAM: LUMBAR SPINE - 2-3 VIEW; DG C-ARM 1-60 MIN COMPARISON:  MRI 02/02/2020 FINDINGS: Two low resolution intraoperative spot views of the lumbar spine. Total fluoroscopy time was 45 seconds. The images demonstrate fixating screws at L4 and L5 with interbody device. IMPRESSION: Intraoperative fluoroscopic assistance provided during lumbar spine surgery. Electronically Signed   By: Donavan Foil M.D.   On:  06/21/2020 17:51    Assessment/Plan: Patient is doing well and progressing as expected. Plan to work with therapies. Continue LSO brace when OOB. Encourage ambulation and mobility. Will plan for discharge today.   LOS: 1 day    Marvis Moeller 06/22/2020, 7:52 AM   Patient is delighted with her progress and is doing well.

## 2020-08-09 ENCOUNTER — Other Ambulatory Visit: Payer: Self-pay | Admitting: Family Medicine

## 2020-08-09 DIAGNOSIS — Z1231 Encounter for screening mammogram for malignant neoplasm of breast: Secondary | ICD-10-CM

## 2020-09-19 ENCOUNTER — Ambulatory Visit: Payer: Medicare Other

## 2020-11-01 ENCOUNTER — Other Ambulatory Visit: Payer: Self-pay

## 2020-11-01 ENCOUNTER — Ambulatory Visit: Payer: Medicare Other | Admitting: Orthopedic Surgery

## 2020-11-01 ENCOUNTER — Ambulatory Visit
Admission: RE | Admit: 2020-11-01 | Discharge: 2020-11-01 | Disposition: A | Discharge status: Home / Self Care | Payer: Medicare Other | Source: Ambulatory Visit | Attending: Family Medicine | Admitting: Family Medicine

## 2020-11-01 DIAGNOSIS — Z1231 Encounter for screening mammogram for malignant neoplasm of breast: Secondary | ICD-10-CM

## 2020-11-05 ENCOUNTER — Ambulatory Visit (INDEPENDENT_AMBULATORY_CARE_PROVIDER_SITE_OTHER): Payer: Medicare Other

## 2020-11-05 ENCOUNTER — Encounter: Payer: Self-pay | Admitting: Orthopedic Surgery

## 2020-11-05 ENCOUNTER — Ambulatory Visit (INDEPENDENT_AMBULATORY_CARE_PROVIDER_SITE_OTHER): Payer: Medicare Other | Admitting: Orthopedic Surgery

## 2020-11-05 DIAGNOSIS — M25572 Pain in left ankle and joints of left foot: Secondary | ICD-10-CM | POA: Diagnosis not present

## 2020-11-05 NOTE — Progress Notes (Signed)
Office Visit Note   Patient: Melinda Drake           Date of Birth: 05-04-1956           MRN: [id number] Visit Date: 11/05/2020              Requested by: Lujean Amel, MD [place],  [place] [address] PCP: Lujean Amel, MD  Chief Complaint  Patient presents with  . Left Ankle - Pain    Hx left ankle triple arthrodesis 04/2013      HPI: Patient is a 65 year old woman who is status post a triple arthrodesis approximately 8 years ago in September 2014.  Patient states that she has been having pain now on the dorsum and lateral aspect of her foot for about a month.  Patient is most recently status post lumbar fusion with Dr. Vertell Limber  Assessment & Plan: Visit Diagnoses:  1. Pain in left ankle and joints of left foot     Plan: Recommended a stiff soled walking sneaker recommended Voltaren gel on the dorsum of her foot.  Follow-up in a month if she is still symptomatic.  Follow-Up Instructions: Return if symptoms worsen or fail to improve.   Ortho Exam  Patient is alert, oriented, no adenopathy, well-dressed, normal affect, normal respiratory effort. Examination patient has pes planus she walks with her foot externally rotated she has essentially minimal range of motion of her ankle only about 20 degrees range of motion and this is not tender with range of motion there is no crepitation no evidence of hardware impingement with ankle range of motion.  The anterior aspect of the ankle is not tender to palpation.  She is tender to palpation over the dorsum of the midfoot as well as laterally over the midfoot.  Imaging: XR Ankle Complete Left  Result Date: 11/05/2020 Three-view radiographs of the left ankle shows a congruent mortise there is 1 talonavicular fusion screw is prominent.  No images are attached to the encounter.  Labs: Lab Results  Component Value Date   HGBA1C 5.8 (H) 06/19/2020     Lab Results  Component Value Date    ALBUMIN 4.0 04/20/2013    No results found for: MG No results found for: VD25OH  No results found for: PREALBUMIN CBC EXTENDED Latest Ref Rng & Units 06/19/2020 05/16/2019 04/27/2015  WBC 4.0 - 10.5 K/uL 9.8 15.4(H) 11.7(H)  RBC 3.87 - 5.11 MIL/uL 3.64(L) 3.88 3.76(L)  HGB 12.0 - 15.0 g/dL 11.1(L) 12.0 11.5(L)  HCT 36.0 - 46.0 % 35.2(L) 37.8 35.2(L)  PLT 150 - 400 K/uL 335 344 312  NEUTROABS 1.7 - 7.7 K/uL - 11.7(H) -  LYMPHSABS 0.7 - 4.0 K/uL - 2.7 -     There is no height or weight on file to calculate BMI.  Orders:  Orders Placed This Encounter  Procedures  . XR Ankle Complete Left   No orders of the defined types were placed in this encounter.    Procedures: No procedures performed  Clinical Data: No additional findings.  ROS:  All other systems negative, except as noted in the HPI. Review of Systems  Objective: Vital Signs: There were no vitals taken for this visit.  Specialty Comments:  No specialty comments available.  PMFS History: Patient Active Problem List   Diagnosis Date Noted  . Lumbar spine instability 06/21/2020  . Achalasia s/p Heller myotomy/Toupet 05/03/2015 05/03/2015  . Diabetes mellitus without complication ([place])   . Arthritis  Past Medical History:  Diagnosis Date  . Arthritis   . Bronchitis   . Diabetes mellitus without complication ([place])   . Family history of adverse reaction to anesthesia    N/V   . GERD (gastroesophageal reflux disease)   . H/O stem cell transplant ([place]) 1997  . History of biopsy 1997   left side neck  . Non Hodgkin's lymphoma ([place]) 1997  . PONV (postoperative nausea and vomiting)     Family History  Problem Relation Age of Onset  . Diabetes Mother   . Coronary artery disease Mother   . Hypertension Mother   . Thyroid nodules Sister   . Hypertension Sister     Past Surgical History:  Procedure Laterality Date  . ANKLE FUSION Left 04/27/2013   Procedure: ANKLE FUSION- left;  Surgeon: Newt Minion,  MD;  Location: [facility];  Service: Orthopedics;  Laterality: Left;  Left Subtalar Fusion and Talonavicular Fusion  . ESOPHAGEAL MANOMETRY N/A 03/05/2015   Procedure: ESOPHAGEAL MANOMETRY (EM);  Surgeon: Clarene Essex, MD;  [facility];  Service: Endoscopy;  Laterality: N/A;  . EYE SURGERY  ? early 2000's   bilateral cataracts  . LAPAROSCOPIC LYSIS OF ADHESIONS  05/03/2015   Procedure: LAPAROSCOPIC LYSIS OF ADHESIONS;  Surgeon: Michael Boston, MD;  [facility];  Service: General;;  . lymphoma excision Left 1996   Social History   Occupational History  . Not on file  Tobacco Use  . Smoking status: Former Smoker    Packs/day: 0.25    Types: Cigarettes  . Smokeless tobacco: Never Used  Vaping Use  . Vaping Use: Never used  Substance and Sexual Activity  . Alcohol use: Yes    Comment: socially  . Drug use: No  . Sexual activity: Not Currently

## 2020-12-18 ENCOUNTER — Encounter: Payer: Self-pay | Admitting: Physical Therapy

## 2020-12-18 ENCOUNTER — Ambulatory Visit: Payer: Medicare Other | Attending: Orthopedic Surgery | Admitting: Physical Therapy

## 2020-12-18 ENCOUNTER — Other Ambulatory Visit: Payer: Self-pay

## 2020-12-18 DIAGNOSIS — G8929 Other chronic pain: Secondary | ICD-10-CM | POA: Diagnosis present

## 2020-12-18 DIAGNOSIS — M25512 Pain in left shoulder: Secondary | ICD-10-CM | POA: Diagnosis present

## 2020-12-18 DIAGNOSIS — M25612 Stiffness of left shoulder, not elsewhere classified: Secondary | ICD-10-CM | POA: Insufficient documentation

## 2020-12-18 DIAGNOSIS — M6281 Muscle weakness (generalized): Secondary | ICD-10-CM | POA: Diagnosis present

## 2020-12-18 NOTE — Patient Instructions (Signed)
Access Code: [email or web address] URL: [email or web address] Date: 12/18/2020 Prepared by: Ruben Im  Exercises Supine Shoulder Flexion Overhead with Dowel - 5-7 x daily - 7 x weekly - 1 sets - 4 reps - 30 hold Supine Shoulder Flexion with Dowel - 5-7 x daily - 7 x weekly - 3 sets - 4 reps - 30 hold Supine Shoulder Horizontal Abduction Adduction AAROM with Dowel - 1 x daily - 7 x weekly - 1 sets - 10 reps Supine Shoulder Press with Dowel - 1 x daily - 7 x weekly - 1 sets - 10 reps

## 2020-12-18 NOTE — Therapy (Signed)
[facility] [address], [place], [place], [address] Phone: [phone]   Fax:  [phone]  Physical Therapy Evaluation  Patient Details  Name: Melinda Drake MRN: [id number] Date of Birth: [birth year] Referring Provider (PT): Dr. Mardelle Matte   Encounter Date: 12/18/2020   PT End of Session - 12/18/20 2115    Visit Number 1    Date for PT Re-Evaluation 02/12/21    Authorization Type Medicare    PT Start Time 1145    PT Stop Time 1230    PT Time Calculation (min) 45 min    Activity Tolerance Patient tolerated treatment well           Past Medical History:  Diagnosis Date  . Arthritis   . Bronchitis   . Diabetes mellitus without complication ([place])   . Family history of adverse reaction to anesthesia    N/V   . GERD (gastroesophageal reflux disease)   . H/O stem cell transplant ([place]) 1997  . History of biopsy 1997   left side neck  . Non Hodgkin's lymphoma ([place]) 1997  . PONV (postoperative nausea and vomiting)     Past Surgical History:  Procedure Laterality Date  . ANKLE FUSION Left 04/27/2013   Procedure: ANKLE FUSION- left;  Surgeon: Newt Minion, MD;  Location: [facility];  Service: Orthopedics;  Laterality: Left;  Left Subtalar Fusion and Talonavicular Fusion  . ESOPHAGEAL MANOMETRY N/A 03/05/2015   Procedure: ESOPHAGEAL MANOMETRY (EM);  Surgeon: Clarene Essex, MD;  [facility];  Service: Endoscopy;  Laterality: N/A;  . EYE SURGERY  ? early 2000's   bilateral cataracts  . LAPAROSCOPIC LYSIS OF ADHESIONS  05/03/2015   Procedure: LAPAROSCOPIC LYSIS OF ADHESIONS;  Surgeon: Michael Boston, MD;  [facility];  Service: General;;  . lymphoma excision Left 1996    There were no vitals filed for this visit.    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  3-4 months.  No apparent injury.  Last Wednesday got cortisone shot which  really helped with ROM and pain.  Diff reach behind the back; reaching up and across the body.    Pertinent History problem right  shoulder a few years ago resolved in 2 months;  LB surgery last November good result    Limitations House hold activities;Lifting    Patient Stated Goals normal ROM and no pain    Currently in Pain? Yes    Pain Location Shoulder    Pain Orientation Left;Upper    Pain Type Chronic pain    Pain Relieving Factors reaching up, across body, behind back    Effect of Pain on Daily Activities heat/ice haven't used in 1 week              [facility] PT Assessment - 12/18/20 0001      Assessment   Medical Diagnosis left adhesive capsulitis    Referring Provider (PT) Dr. Mardelle Matte    Onset Date/Surgical Date --   3-4 months   Next MD Visit 6 weeks    Prior Therapy right shoulder a few years ago; back surgery last Nov      Precautions   Precautions None      Restrictions   Weight Bearing Restrictions No      Balance Screen   Has the patient fallen in the past 6 months No    Has the patient had a decrease in activity  level because of a fear of falling?  No    Is the patient reluctant to leave their home because of a fear of falling?  No      Home Environment   Living Environment Private residence    Living Arrangements Other relatives    Available Help at Discharge --   helps out sister     Prior Function   Level of Independence Independent with basic ADLs    Vocation Retired    Leisure go to movies; travel; concerts      Observation/Other Assessments   Focus on Therapeutic Outcomes (FOTO)  72%      AROM   Right Shoulder Flexion 157 Degrees    Right Shoulder ABduction 167 Degrees    Right Shoulder Internal Rotation --   T6   Right Shoulder External Rotation 67 Degrees    Left Shoulder Flexion 117 Degrees    Left Shoulder ABduction 128 Degrees    Left Shoulder Internal Rotation --   T10   Left Shoulder External Rotation 50 Degrees      Strength    Right Shoulder Flexion 4+/5    Right Shoulder Extension 4+/5    Right Shoulder ABduction 4+/5    Right Shoulder Internal Rotation 4+/5    Right Shoulder External Rotation 4+/5    Left Shoulder Flexion 3+/5    Left Shoulder Extension 4/5    Left Shoulder ABduction 3/5    Left Shoulder Internal Rotation 4-/5    Left Shoulder External Rotation 4-/5      Hawkins-Kennedy test   Findings Positive    Side Left      Empty Can test   Findings Positive    Side Left      Lag time at 0 degrees   Findings Negative      Drop Arm test   Findings Positive    Side Left    Comment painful                      Objective measurements completed on examination: See above findings.               PT Education - 12/18/20 2114    Education Details supine cane flexion stretch vertical and horizontal;  supine cane press; supine horizontal abduction cane    Person(s) Educated Patient    Methods Explanation;Demonstration;Handout    Comprehension Verbalized understanding;Returned demonstration            PT Short Term Goals - 12/18/20 2127      PT SHORT TERM GOAL #1   Title The patient will demonstrate knowledge of basic ROM HEP for restoring shoulder mobility    Time 4    Period Weeks    Status New    Target Date 01/15/21      PT SHORT TERM GOAL #2   Title The patient will have improved left shoulder flexion to 130 and scaption/abduction to 140 degrees    Time 4    Period Weeks    Status New      PT SHORT TERM GOAL #3   Title The patient will report a 30% improvement in pain with dressing and grooming tasks    Time 4    Period Weeks    Status New             PT Long Term Goals - 12/18/20 2129      PT LONG TERM GOAL #1   Title The patient  will be independent in safe self progression of HEP and/or gym program for shoulder ROM and strength    Time 8    Period Weeks    Status New    Target Date 02/12/21      PT LONG TERM GOAL #2   Title The patient  will have improved left shoulder elevation/scaption to 150 degrees needed for reaching overhead    Time 8    Period Weeks    Status New      PT LONG TERM GOAL #3   Title The patient will have improved left glenohumeral and scapular strength to grossly 4/5 needed for lifting/carrying light to medium objects    Time 8    Period Weeks    Status New      PT LONG TERM GOAL #4   Title The patient will report a 60% improvement in left shoulder pain and function    Time 8    Period Weeks    Status New      PT LONG TERM GOAL #5   Title FOTO function score improved to 73%    Time 8    Period Weeks    Status New                  Plan - 12/18/20 2116    Clinical Impression Statement The patient reports a 3-4 month history of left upper arm pain and limited ROM which began for no apparent reason.  She reports she had a cortisone injection last Wednesday which helped quite a bit.   She states she has been diagnosed with a frozen shoulder and a partial tear of her rotatior cuff.  She has risk factors for adhesive capsulitis including diabetes and has a history of right shoulder issues a few years ago which resolved within a few months.  Limited and painful left shoulder ROM: flexion 117, abduction 128, internal rotation T10, external rotation 50 degrees.  Decreased glenohumeral and scapular strength 3/5 to 4-/5.  + Michel Bickers, +Empty Can test, painful resisted shoulder abduction.  She continues to go to the gym but is uncertain which machines are best.  She would benefit from PT to address these deficits.    Personal Factors and Comorbidities Age;Comorbidity 1;Comorbidity 2;Sex    Comorbidities risk factors for adhesive capsulitis: female, age, diabetes; previous history of other shoulder issue; OA knees; HTN    Examination-Activity Limitations Reach Overhead;Lift;Hygiene/Grooming;Dressing    Examination-Participation Restrictions Other;Meal Prep    Stability/Clinical Decision Making  Stable/Uncomplicated    Clinical Decision Making Low    Rehab Potential Good    PT Frequency 1x / week    PT Duration 8 weeks    PT Treatment/Interventions ADLs/Self Care Home Management;Aquatic Therapy;Cryotherapy;Electrical Stimulation;Ultrasound;Moist Heat;Iontophoresis 4mg /ml Dexamethasone;Therapeutic activities;Therapeutic exercise;Neuromuscular re-education;Manual techniques;Patient/family education;Dry needling;Taping    PT Next Visit Plan glenohumeral joint mobs; scapular mobs; capsular stretches; left shoulder ROM; resistive bands for strengthening    PT Home Exercise Plan [email or web address]    Consulted and Agree with Plan of Care Patient           Patient will benefit from skilled therapeutic intervention in order to improve the following deficits and impairments:  Decreased range of motion,Impaired UE functional use,Pain,Hypomobility,Decreased strength  Visit Diagnosis: Chronic left shoulder pain - Plan: PT plan of care cert/re-cert  Stiffness of left shoulder, not elsewhere classified - Plan: PT plan of care cert/re-cert  Muscle weakness (generalized) - Plan: PT plan of care cert/re-cert     Problem  List Patient Active Problem List   Diagnosis Date Noted  . Lumbar spine instability 06/21/2020  . Achalasia s/p Heller myotomy/Toupet 05/03/2015 05/03/2015  . Diabetes mellitus without complication ([place])   . Arthritis    Ruben Im, PT 12/18/20 9:34 PM Phone: [phone] Fax: [phone] Alvera Singh 12/18/2020, 9:34 PM  [facility] [address] [place], [place], [address] Phone: [phone]   Fax:  [phone]  Name: Melinda Drake MRN: [id number] Date of Birth: 1956/01/07

## 2020-12-27 ENCOUNTER — Other Ambulatory Visit: Payer: Self-pay

## 2020-12-27 ENCOUNTER — Ambulatory Visit: Payer: Medicare Other | Admitting: Physical Therapy

## 2020-12-27 DIAGNOSIS — M25512 Pain in left shoulder: Secondary | ICD-10-CM | POA: Diagnosis not present

## 2020-12-27 DIAGNOSIS — M6281 Muscle weakness (generalized): Secondary | ICD-10-CM

## 2020-12-27 DIAGNOSIS — M25612 Stiffness of left shoulder, not elsewhere classified: Secondary | ICD-10-CM

## 2020-12-27 DIAGNOSIS — G8929 Other chronic pain: Secondary | ICD-10-CM

## 2020-12-27 NOTE — Therapy (Signed)
[facility] [address], [place], [place], [address] Phone: [phone]   Fax:  [phone]  Physical Therapy Treatment  Patient Details  Name: Melinda Drake MRN: [id number] Date of Birth: 04-15-1956 Referring Provider (PT): Dr. Mardelle Matte   Encounter Date: 12/27/2020   PT End of Session - 12/27/20 1952    Visit Number 2    Date for PT Re-Evaluation 02/12/21    Authorization Type Medicare    PT Start Time 0930    PT Stop Time 1012    PT Time Calculation (min) 42 min    Activity Tolerance Patient tolerated treatment well           Past Medical History:  Diagnosis Date  . Arthritis   . Bronchitis   . Diabetes mellitus without complication ([place])   . Family history of adverse reaction to anesthesia    N/V   . GERD (gastroesophageal reflux disease)   . H/O stem cell transplant ([place]) 1997  . History of biopsy 1997   left side neck  . Non Hodgkin's lymphoma ([place]) 1997  . PONV (postoperative nausea and vomiting)     Past Surgical History:  Procedure Laterality Date  . ANKLE FUSION Left 04/27/2013   Procedure: ANKLE FUSION- left;  Surgeon: Newt Minion, MD;  Location: [facility];  Service: Orthopedics;  Laterality: Left;  Left Subtalar Fusion and Talonavicular Fusion  . ESOPHAGEAL MANOMETRY N/A 03/05/2015   Procedure: ESOPHAGEAL MANOMETRY (EM);  Surgeon: Clarene Essex, MD;  [facility];  Service: Endoscopy;  Laterality: N/A;  . EYE SURGERY  ? early 2000's   bilateral cataracts  . LAPAROSCOPIC LYSIS OF ADHESIONS  05/03/2015   Procedure: LAPAROSCOPIC LYSIS OF ADHESIONS;  Surgeon: Michael Boston, MD;  [facility];  Service: General;;  . lymphoma excision Left 1996    There were no vitals filed for this visit.   Subjective Assessment - 12/27/20 0931    Subjective I haven't done much since I was away.  Stiff but not overly  painful.    Pertinent History problem right  shoulder a few years ago resolved in  2 months;  LB surgery last November good result    Currently in Pain? Yes    Pain Score 2     Pain Orientation Left    Pain Type Chronic pain                             [facility] Adult PT Treatment/Exercise - 12/27/20 0001      Shoulder Exercises: Supine   Protraction Strengthening;Left;10 reps    Protraction Weight (lbs) 2    Flexion AAROM;Both;10 reps    Flexion Limitations cane assist    Other Supine Exercises 12:00 6:00 small arcs 2# 10x    Other Supine Exercises 3:00 9:00 small arcs 10x      Shoulder Exercises: Sidelying   External Rotation Strengthening;Left;15 reps;Weights    External Rotation Weight (lbs) 2      Shoulder Exercises: Standing   Extension Left;20 reps;Theraband    Theraband Level (Shoulder Extension) Level 3 (Green)    Row Strengthening;Left;20 reps;Theraband    Theraband Level (Shoulder Row) Level 3 (Green)      Shoulder Exercises: Pulleys   Flexion 2 minutes      Shoulder Exercises: Power Warden/ranger Exercises 15# seated lat bar 15x  PT Education - 12/27/20 1011    Education Details supine serratus punch; supine clocks; sidelying rotation; green band rows and extensions    Person(s) Educated Patient    Methods Explanation;Demonstration;Handout    Comprehension Returned demonstration;Verbalized understanding            PT Short Term Goals - 12/18/20 2127      PT SHORT TERM GOAL #1   Title The patient will demonstrate knowledge of basic ROM HEP for restoring shoulder mobility    Time 4    Period Weeks    Status New    Target Date 01/15/21      PT SHORT TERM GOAL #2   Title The patient will have improved left shoulder flexion to 130 and scaption/abduction to 140 degrees    Time 4    Period Weeks    Status New      PT SHORT TERM GOAL #3   Title The patient will report a 30% improvement in pain with dressing and grooming tasks    Time 4    Period Weeks    Status New              PT Long Term Goals - 12/18/20 2129      PT LONG TERM GOAL #1   Title The patient will be independent in safe self progression of HEP and/or gym program for shoulder ROM and strength    Time 8    Period Weeks    Status New    Target Date 02/12/21      PT LONG TERM GOAL #2   Title The patient will have improved left shoulder elevation/scaption to 150 degrees needed for reaching overhead    Time 8    Period Weeks    Status New      PT LONG TERM GOAL #3   Title The patient will have improved left glenohumeral and scapular strength to grossly 4/5 needed for lifting/carrying light to medium objects    Time 8    Period Weeks    Status New      PT LONG TERM GOAL #4   Title The patient will report a 60% improvement in left shoulder pain and function    Time 8    Period Weeks    Status New      PT LONG TERM GOAL #5   Title FOTO function score improved to 73%    Time 8    Period Weeks    Status New                 Plan - 12/27/20 0945    Clinical Impression Statement The patient reports low intensity pain on arrival and is able to progress ROM, initiate scapular strengthening and stability ex's without exacerbation of pain.  Improved ROM noted compared to initial assessment last visit.  She is interested in returning to the gym so we discussed trial of low intensity/load with lat bar and seated row.  Therapist monitoring response and providing cues for optimal muscle activation.    Comorbidities risk factors for adhesive capsulitis: female, age, diabetes; previous history of other shoulder issue; OA knees; HTN    Rehab Potential Good    PT Frequency 1x / week    PT Duration 8 weeks    PT Treatment/Interventions ADLs/Self Care Home Management;Aquatic Therapy;Cryotherapy;Electrical Stimulation;Ultrasound;Moist Heat;Iontophoresis 4mg /ml Dexamethasone;Therapeutic activities;Therapeutic exercise;Neuromuscular re-education;Manual techniques;Patient/family education;Dry  needling;Taping    PT Next Visit Plan manual therapy as needed; capsular stretches; left shoulder  ROM; resistive bands for strengthening    PT Home Exercise Plan [email or web address]           Patient will benefit from skilled therapeutic intervention in order to improve the following deficits and impairments:  Decreased range of motion,Impaired UE functional use,Pain,Hypomobility,Decreased strength  Visit Diagnosis: Chronic left shoulder pain  Stiffness of left shoulder, not elsewhere classified  Muscle weakness (generalized)     Problem List Patient Active Problem List   Diagnosis Date Noted  . Lumbar spine instability 06/21/2020  . Achalasia s/p Heller myotomy/Toupet 05/03/2015 05/03/2015  . Diabetes mellitus without complication ([place])   . Arthritis    Ruben Im, PT 12/27/20 7:57 PM Phone: [phone] Fax: [phone] Alvera Singh 12/27/2020, 7:57 PM  [facility] [address], [place], [place], [address] Phone: [phone]   Fax:  [phone]  Name: Melinda Drake MRN: [id number] Date of Birth: 06/06/1956

## 2020-12-27 NOTE — Patient Instructions (Signed)
Access Code: [email or web address] URL: [email or web address] Date: 12/27/2020 Prepared by: Ruben Im  Exercises Supine Shoulder Flexion Overhead with Dowel - 5-7 x daily - 7 x weekly - 1 sets - 4 reps - 30 hold Supine Shoulder Flexion with Dowel - 5-7 x daily - 7 x weekly - 3 sets - 4 reps - 30 hold Supine Shoulder Horizontal Abduction Adduction AAROM with Dowel - 1 x daily - 7 x weekly - 1 sets - 10 reps Supine Shoulder Press with Dowel - 1 x daily - 7 x weekly - 1 sets - 10 reps Supine Scapular Protraction in Flexion with Dumbbells - 1 x daily - 7 x weekly - 10 reps clocks - 1 x daily - 7 x weekly - 2 sets - 10 reps Sidelying Shoulder External Rotation Dumbbell - 1 x daily - 7 x weekly - 1 sets - 10 reps Standing Bilateral Low Shoulder Row with Anchored Resistance - 1 x daily - 7 x weekly - 2 sets - 10 reps Single Arm Shoulder Extension with Anchored Resistance (Mirrored) - 1 x daily - 7 x weekly - 2 sets - 10 reps

## 2021-01-02 ENCOUNTER — Other Ambulatory Visit: Payer: Self-pay

## 2021-01-02 ENCOUNTER — Ambulatory Visit: Payer: Medicare Other | Admitting: Physical Therapy

## 2021-01-02 DIAGNOSIS — M25512 Pain in left shoulder: Secondary | ICD-10-CM | POA: Diagnosis not present

## 2021-01-02 DIAGNOSIS — G8929 Other chronic pain: Secondary | ICD-10-CM

## 2021-01-02 DIAGNOSIS — M6281 Muscle weakness (generalized): Secondary | ICD-10-CM

## 2021-01-02 DIAGNOSIS — M25612 Stiffness of left shoulder, not elsewhere classified: Secondary | ICD-10-CM

## 2021-01-02 NOTE — Patient Instructions (Signed)
Standing Shoulder Internal Rotation Stretch with Towel - 1 x daily - 7 x weekly - 1 sets - 10 reps - 5s hold

## 2021-01-02 NOTE — Therapy (Signed)
[facility] [address], [place], [place], [address] Phone: [phone]   Fax:  [phone]  Physical Therapy Treatment  Patient Details  Name: Melinda Drake MRN: [id number] Date of Birth: [birth year] Referring Provider (PT): Dr. Mardelle Matte   Encounter Date: 01/02/2021   PT End of Session - 01/02/21 1657    Visit Number 3    Date for PT Re-Evaluation 02/12/21    Authorization Type Medicare    Progress Note Due on Visit 10    PT Start Time 1100    PT Stop Time 1140    PT Time Calculation (min) 40 min    Activity Tolerance Patient tolerated treatment well;No increased pain    Behavior During Therapy WFL for tasks assessed/performed           Past Medical History:  Diagnosis Date  . Arthritis   . Bronchitis   . Diabetes mellitus without complication ([place])   . Family history of adverse reaction to anesthesia    N/V   . GERD (gastroesophageal reflux disease)   . H/O stem cell transplant ([place]) 1997  . History of biopsy 1997   left side neck  . Non Hodgkin's lymphoma ([place]) 1997  . PONV (postoperative nausea and vomiting)     Past Surgical History:  Procedure Laterality Date  . ANKLE FUSION Left 04/27/2013   Procedure: ANKLE FUSION- left;  Surgeon: Newt Minion, MD;  Location: [place];  Service: Orthopedics;  Laterality: Left;  Left Subtalar Fusion and Talonavicular Fusion  . ESOPHAGEAL MANOMETRY N/A 03/05/2015   Procedure: ESOPHAGEAL MANOMETRY (EM);  Surgeon: Clarene Essex, MD;  [facility];  Service: Endoscopy;  Laterality: N/A;  . EYE SURGERY  ? early 2000's   bilateral cataracts  . LAPAROSCOPIC LYSIS OF ADHESIONS  05/03/2015   Procedure: LAPAROSCOPIC LYSIS OF ADHESIONS;  Surgeon: Michael Boston, MD;  Location: [facility];  Service: General;;  . lymphoma excision Left 1996    There were no vitals filed for this visit.       [facility] PT Assessment - 01/02/21 0001      AROM   Left Shoulder Flexion 130  Degrees    Left Shoulder ABduction 113 Degrees                         [facility] Adult PT Treatment/Exercise - 01/02/21 0001      Shoulder Exercises: Supine   Protraction Strengthening;Left;10 reps    Protraction Weight (lbs) 2    Flexion AAROM;Both;15 reps    Flexion Limitations cane assist    Other Supine Exercises 12:00 6:00 small arcs 2# 10x    Other Supine Exercises 3:00 9:00 small arcs 10x      Shoulder Exercises: Sidelying   External Rotation Strengthening;Left;15 reps;Weights    External Rotation Weight (lbs) 2    ABduction AROM;Left;15 reps      Shoulder Exercises: Standing   Other Standing Exercises power tower single arm chest press; 20lbs; x10 repetitions      Shoulder Exercises: ROM/Strengthening   UBE (Upper Arm Bike) Level 1.5; 1.5 minutes forward/back    Ranger flexion in standing; x1 minutes      Shoulder Exercises: Power Warden/ranger Exercises 15# seated lat bar; 2 x 12 repetitions      Manual Therapy   Manual Therapy Joint mobilization;Soft tissue mobilization;Passive ROM    Joint Mobilization Grade 1-4 Lt GH joint  inferior, anterior, posterior  Soft tissue mobilization Lt upper trap, levator scap, msucles of rotator cuff    Passive ROM Lt shoulder all directions with gentle oscillations for decreased muscle guarding                    PT Short Term Goals - 01/02/21 1656      PT SHORT TERM GOAL #1   Title The patient will demonstrate knowledge of basic ROM HEP for restoring shoulder mobility    Time 4    Period Weeks    Status On-going    Target Date 01/15/21      PT SHORT TERM GOAL #2   Title The patient will have improved left shoulder flexion to 130 and scaption/abduction to 140 degrees    Time 4    Period Weeks    Status Partially Met   Flexion 130 degrees this date following manual            PT Long Term Goals - 12/18/20 2129      PT LONG TERM GOAL #1   Title The patient will be independent in  safe self progression of HEP and/or gym program for shoulder ROM and strength    Time 8    Period Weeks    Status New    Target Date 02/12/21      PT LONG TERM GOAL #2   Title The patient will have improved left shoulder elevation/scaption to 150 degrees needed for reaching overhead    Time 8    Period Weeks    Status New      PT LONG TERM GOAL #3   Title The patient will have improved left glenohumeral and scapular strength to grossly 4/5 needed for lifting/carrying light to medium objects    Time 8    Period Weeks    Status New      PT LONG TERM GOAL #4   Title The patient will report a 60% improvement in left shoulder pain and function    Time 8    Period Weeks    Status New      PT LONG TERM GOAL #5   Title FOTO function score improved to 73%    Time 8    Period Weeks    Status New                 Plan - 01/02/21 1656    Clinical Impression Statement Patient demonstrates improved AROM as flexion AROM increased to 120 degrees. Therapist noting increased tension of anterior, inferior, and posterior capsule of Lt GH joint. Flexion AROM improved to 130 degrees following inferior and posterior anterior GH mobilizations. Would benefit from continued skilled intervention for improved functional use of Lt UE.    Personal Factors and Comorbidities Age;Comorbidity 1;Comorbidity 2;Sex    Comorbidities risk factors for adhesive capsulitis: female, age, diabetes; previous history of other shoulder issue; OA knees; HTN    Examination-Activity Limitations Reach Overhead;Lift;Hygiene/Grooming;Dressing    Examination-Participation Restrictions Other;Meal Prep    Rehab Potential Good    PT Frequency 1x / week    PT Duration 8 weeks    PT Treatment/Interventions ADLs/Self Care Home Management;Aquatic Therapy;Cryotherapy;Electrical Stimulation;Ultrasound;Moist Heat;Iontophoresis 75m/ml Dexamethasone;Therapeutic activities;Therapeutic exercise;Neuromuscular re-education;Manual  techniques;Patient/family education;Dry needling;Taping    PT Next Visit Plan continue manual therapy as needed; progress strengthening and ROM to patient tolerance    PT Home Exercise Plan [id number]    Consulted and Agree with Plan of Care Patient  Patient will benefit from skilled therapeutic intervention in order to improve the following deficits and impairments:  Decreased range of motion,Impaired UE functional use,Pain,Hypomobility,Decreased strength  Visit Diagnosis: Chronic left shoulder pain  Stiffness of left shoulder, not elsewhere classified  Muscle weakness (generalized)     Problem List Patient Active Problem List   Diagnosis Date Noted  . Lumbar spine instability 06/21/2020  . Achalasia s/p Heller myotomy/Toupet 05/03/2015 05/03/2015  . Diabetes mellitus without complication ([place])   . Arthritis    Everardo All PT, DPT  01/02/21 5:02 PM    [facility] [address], [place], [place], [address] Phone: [phone]   Fax:  [phone]  Name: Melinda Drake MRN: [id number] Date of Birth: [birth year]

## 2021-01-04 ENCOUNTER — Encounter: Payer: Medicare Other | Admitting: Physical Therapy

## 2021-01-11 ENCOUNTER — Other Ambulatory Visit: Payer: Self-pay

## 2021-01-11 ENCOUNTER — Ambulatory Visit: Payer: Medicare Other | Attending: Orthopedic Surgery | Admitting: Physical Therapy

## 2021-01-11 DIAGNOSIS — M6281 Muscle weakness (generalized): Secondary | ICD-10-CM | POA: Diagnosis present

## 2021-01-11 DIAGNOSIS — M25612 Stiffness of left shoulder, not elsewhere classified: Secondary | ICD-10-CM | POA: Insufficient documentation

## 2021-01-11 DIAGNOSIS — G8929 Other chronic pain: Secondary | ICD-10-CM | POA: Diagnosis present

## 2021-01-11 DIAGNOSIS — M25512 Pain in left shoulder: Secondary | ICD-10-CM

## 2021-01-11 NOTE — Therapy (Signed)
[facility] [address], [place], [place], [address] Phone: [phone]   Fax:  [phone]  Physical Therapy Treatment  Patient Details  Name: Melinda Drake MRN: [id number] Date of Birth: 11/09/1955 Referring Provider (PT): Dr. Mardelle Matte   Encounter Date: 01/11/2021   PT End of Session - 01/11/21 1025    Visit Number 4    Date for PT Re-Evaluation 02/12/21    Authorization Type Medicare    Authorization Time Period KX at 15 visits    Progress Note Due on Visit 10    PT Start Time 0931    PT Stop Time 1012    PT Time Calculation (min) 41 min    Activity Tolerance Patient tolerated treatment well;No increased pain    Behavior During Therapy WFL for tasks assessed/performed           Past Medical History:  Diagnosis Date  . Arthritis   . Bronchitis   . Diabetes mellitus without complication ([place])   . Family history of adverse reaction to anesthesia    N/V   . GERD (gastroesophageal reflux disease)   . H/O stem cell transplant ([place]) 1997  . History of biopsy 1997   left side neck  . Non Hodgkin's lymphoma ([place]) 1997  . PONV (postoperative nausea and vomiting)     Past Surgical History:  Procedure Laterality Date  . ANKLE FUSION Left 04/27/2013   Procedure: ANKLE FUSION- left;  Surgeon: Newt Minion, MD;  Location: [facility];  Service: Orthopedics;  Laterality: Left;  Left Subtalar Fusion and Talonavicular Fusion  . ESOPHAGEAL MANOMETRY N/A 03/05/2015   Procedure: ESOPHAGEAL MANOMETRY (EM);  Surgeon: Clarene Essex, MD;  [facility];  Service: Endoscopy;  Laterality: N/A;  . EYE SURGERY  ? early 2000's   bilateral cataracts  . LAPAROSCOPIC LYSIS OF ADHESIONS  05/03/2015   Procedure: LAPAROSCOPIC LYSIS OF ADHESIONS;  Surgeon: Michael Boston, MD;  [facility];  Service: General;;  . lymphoma excision Left 1996    There were no vitals filed for this visit.   Subjective Assessment - 01/11/21 0933     Subjective Patient reports improved pain but has taken medication this morning.    Pertinent History problem right  shoulder a few years ago resolved in 2 months;  LB surgery last November good result    Limitations House hold activities;Lifting    Patient Stated Goals normal ROM and no pain    Currently in Pain? No/denies              [facility] PT Assessment - 01/11/21 0001      AROM   Left Shoulder Flexion 133 Degrees    Left Shoulder ABduction 109 Degrees    Left Shoulder Internal Rotation --   T10                        [facility] Adult PT Treatment/Exercise - 01/11/21 0001      Shoulder Exercises: Supine   Protraction Strengthening;Left;15 reps    Protraction Weight (lbs) 2    Flexion AAROM;Both;15 reps    Flexion Limitations cane assist    Other Supine Exercises 12:00 6:00 small arcs 2# 12x    Other Supine Exercises 3:00 9:00 small arcs 12x      Shoulder Exercises: Sidelying   External Rotation Strengthening;Left    External Rotation Weight (lbs) 2 x 10 reps; 2lbs    ABduction AROM;Left;15 reps  Shoulder Exercises: Standing   Flexion AAROM;Both;15 reps    Shoulder Flexion Weight (lbs) towel slides      Shoulder Exercises: ROM/Strengthening   UBE (Upper Arm Bike) L 1.5; 4 minutes; 2/2    Cybex Row 2 plate    Cybex Row Limitations 20lbs; 2 x 10 reps      Shoulder Exercises: Power Futures trader 20 lbs; lat pull down; 2 x 10 reps      Manual Therapy   Manual Therapy Joint mobilization;Passive ROM    Joint Mobilization Grade 1-4 Lt GH joint  inferior, anterior, posterior    Passive ROM Lt shoulder all directions with gentle oscillations for decreased muscle guarding                    PT Short Term Goals - 01/11/21 1024      PT SHORT TERM GOAL #1   Title The patient will demonstrate knowledge of basic ROM HEP for restoring shoulder mobility    Time 4    Period Weeks    Status Achieved    Target Date 01/15/21       PT SHORT TERM GOAL #2   Title The patient will have improved left shoulder flexion to 130 and scaption/abduction to 140 degrees    Time 4    Period Weeks    Status Partially Met      PT SHORT TERM GOAL #3   Title The patient will report a 30% improvement in pain with dressing and grooming tasks    Time 4    Period Weeks    Status On-going             PT Long Term Goals - 12/18/20 2129      PT LONG TERM GOAL #1   Title The patient will be independent in safe self progression of HEP and/or gym program for shoulder ROM and strength    Time 8    Period Weeks    Status New    Target Date 02/12/21      PT LONG TERM GOAL #2   Title The patient will have improved left shoulder elevation/scaption to 150 degrees needed for reaching overhead    Time 8    Period Weeks    Status New      PT LONG TERM GOAL #3   Title The patient will have improved left glenohumeral and scapular strength to grossly 4/5 needed for lifting/carrying light to medium objects    Time 8    Period Weeks    Status New      PT LONG TERM GOAL #4   Title The patient will report a 60% improvement in left shoulder pain and function    Time 8    Period Weeks    Status New      PT LONG TERM GOAL #5   Title FOTO function score improved to 73%    Time 8    Period Weeks    Status New                 Plan - 01/11/21 1017    Clinical Impression Statement Patient demonstrates improved flexion AROM. Verbal cues required to maintain neutral wrist when performing lat pull down and sidelying external rotation exercises. Tactile cues provided for decreased scapular elevation when performing standing row. Would benefit from continued skilled intervention to more readily perform ADLs using Lt UE.    Personal Factors and Comorbidities  Age;Comorbidity 1;Comorbidity 2;Sex    Comorbidities risk factors for adhesive capsulitis: female, age, diabetes; previous history of other shoulder issue; OA knees; HTN     Examination-Activity Limitations Reach Overhead;Lift;Hygiene/Grooming;Dressing    Examination-Participation Restrictions Other;Meal Prep    Rehab Potential Good    PT Frequency 1x / week    PT Duration 8 weeks    PT Treatment/Interventions ADLs/Self Care Home Management;Aquatic Therapy;Cryotherapy;Electrical Stimulation;Ultrasound;Moist Heat;Iontophoresis 63m/ml Dexamethasone;Therapeutic activities;Therapeutic exercise;Neuromuscular re-education;Manual techniques;Patient/family education;Dry needling;Taping    PT Next Visit Plan continue scapular stabilization and UE strengthening to patient tolerance; manual therapy as needed    PT Home Exercise Plan [id number]    Consulted and Agree with Plan of Care Patient           Patient will benefit from skilled therapeutic intervention in order to improve the following deficits and impairments:  Decreased range of motion,Impaired UE functional use,Pain,Hypomobility,Decreased strength  Visit Diagnosis: Chronic left shoulder pain  Stiffness of left shoulder, not elsewhere classified  Muscle weakness (generalized)     Problem List Patient Active Problem List   Diagnosis Date Noted  . Lumbar spine instability 06/21/2020  . Achalasia s/p Heller myotomy/Toupet 05/03/2015 05/03/2015  . Diabetes mellitus without complication ([place]   . Arthritis     KEverardo AllPT, DPT  01/11/21 10:26 AM    [facility] [address] [place] [address]: [phone]  Fax:  [phone] Name: Melinda FENDLEYMRN: [id number] of Birth: [birth year]

## 2021-01-16 ENCOUNTER — Ambulatory Visit: Payer: Medicare Other | Admitting: Physical Therapy

## 2021-01-16 ENCOUNTER — Other Ambulatory Visit: Payer: Self-pay

## 2021-01-16 DIAGNOSIS — M25512 Pain in left shoulder: Secondary | ICD-10-CM | POA: Diagnosis not present

## 2021-01-16 DIAGNOSIS — G8929 Other chronic pain: Secondary | ICD-10-CM

## 2021-01-16 DIAGNOSIS — M25612 Stiffness of left shoulder, not elsewhere classified: Secondary | ICD-10-CM

## 2021-01-16 DIAGNOSIS — M6281 Muscle weakness (generalized): Secondary | ICD-10-CM

## 2021-01-16 NOTE — Therapy (Signed)
[facility] [address] [place], [place], [address] Phone: [phone]   Fax:  [phone]  Physical Therapy Treatment  Patient Details  Name: Melinda Drake MRN: [id number] Date of Birth: April 23, 1956 Referring Provider (PT): Dr. Mardelle Matte   Encounter Date: 01/16/2021   PT End of Session - 01/16/21 1332    Visit Number 5    Date for PT Re-Evaluation 02/12/21    Authorization Type Medicare    Authorization Time Period KX at 15 visits    Progress Note Due on Visit 10    PT Start Time 1149    PT Stop Time 1229    PT Time Calculation (min) 40 min    Activity Tolerance Patient tolerated treatment well;No increased pain    Behavior During Therapy WFL for tasks assessed/performed           Past Medical History:  Diagnosis Date  . Arthritis   . Bronchitis   . Diabetes mellitus without complication ([place])   . Family history of adverse reaction to anesthesia    N/V   . GERD (gastroesophageal reflux disease)   . H/O stem cell transplant ([place]) 1997  . History of biopsy 1997   left side neck  . Non Hodgkin's lymphoma ([place]) 1997  . PONV (postoperative nausea and vomiting)     Past Surgical History:  Procedure Laterality Date  . ANKLE FUSION Left 04/27/2013   Procedure: ANKLE FUSION- left;  Surgeon: Newt Minion, MD;  Location: [facility];  Service: Orthopedics;  Laterality: Left;  Left Subtalar Fusion and Talonavicular Fusion  . ESOPHAGEAL MANOMETRY N/A 03/05/2015   Procedure: ESOPHAGEAL MANOMETRY (EM);  Surgeon: Clarene Essex, MD;  [facility];  Service: Endoscopy;  Laterality: N/A;  . EYE SURGERY  ? early 2000's   bilateral cataracts  . LAPAROSCOPIC LYSIS OF ADHESIONS  05/03/2015   Procedure: LAPAROSCOPIC LYSIS OF ADHESIONS;  Surgeon: Michael Boston, MD;  [facility];  Service: General;;  . lymphoma excision Left 1996    There were no vitals filed for this visit.                       [facility] Adult PT Treatment/Exercise - 01/16/21 0001      Shoulder Exercises: Supine   Protraction Strengthening;Left;15 reps    Protraction Weight (lbs) 2    Diagonals Left;12 reps    Theraband Level (Shoulder Diagonals) Level 1 (Yellow)    Other Supine Exercises 12:00 6:00 small arcs 2# 12x    Other Supine Exercises 3:00 9:00 small arcs 12x      Shoulder Exercises: Sidelying   ABduction AROM;Left;20 reps   manual facilitation of scapular mechanics     Shoulder Exercises: Standing   External Rotation Left;10 reps    Theraband Level (Shoulder External Rotation) Level 2 (Red)    Internal Rotation Left;10 reps    Theraband Level (Shoulder Internal Rotation) Level 2 (Red)    Flexion AAROM;Both;15 reps    Shoulder Flexion Weight (lbs) towel slides    Other Standing Exercises wall clocks; green loop at hands; x10 Lt      Shoulder Exercises: Pulleys   Flexion 1 minute    Scaption 1 minute    ABduction 1 minute      Shoulder Exercises: ROM/Strengthening   UBE (Upper Arm Bike) L2; 5 minutes; 2.5/2.5    Wall Pushups 10 reps  PT Education - 01/16/21 1224    Education Details wall clock yellow tband            PT Short Term Goals - 01/11/21 1024      PT SHORT TERM GOAL #1   Title The patient will demonstrate knowledge of basic ROM HEP for restoring shoulder mobility    Time 4    Period Weeks    Status Achieved    Target Date 01/15/21      PT SHORT TERM GOAL #2   Title The patient will have improved left shoulder flexion to 130 and scaption/abduction to 140 degrees    Time 4    Period Weeks    Status Partially Met      PT SHORT TERM GOAL #3   Title The patient will report a 30% improvement in pain with dressing and grooming tasks    Time 4    Period Weeks    Status On-going             PT Long Term Goals - 12/18/20 2129      PT LONG TERM GOAL #1   Title The patient will be independent in safe self progression of HEP and/or gym program for  shoulder ROM and strength    Time 8    Period Weeks    Status New    Target Date 02/12/21      PT LONG TERM GOAL #2   Title The patient will have improved left shoulder elevation/scaption to 150 degrees needed for reaching overhead    Time 8    Period Weeks    Status New      PT LONG TERM GOAL #3   Title The patient will have improved left glenohumeral and scapular strength to grossly 4/5 needed for lifting/carrying light to medium objects    Time 8    Period Weeks    Status New      PT LONG TERM GOAL #4   Title The patient will report a 60% improvement in left shoulder pain and function    Time 8    Period Weeks    Status New      PT LONG TERM GOAL #5   Title FOTO function score improved to 73%    Time 8    Period Weeks    Status New                 Plan - 01/16/21 1330    Clinical Impression Statement Patient requires frequent intermittent cuing for decreased scapular elevation with all exercises. Tactile cues provided during internal and external rotation exercises for form. Would benefit from continued skilled intervention for improved functional use of Lt UE for ADLs.    Personal Factors and Comorbidities Age;Comorbidity 1;Comorbidity 2;Sex    Comorbidities risk factors for adhesive capsulitis: female, age, diabetes; previous history of other shoulder issue; OA knees; HTN    Examination-Activity Limitations Reach Overhead;Lift;Hygiene/Grooming;Dressing    Examination-Participation Restrictions Other;Meal Prep    Rehab Potential Good    PT Frequency 1x / week    PT Duration 8 weeks    PT Treatment/Interventions ADLs/Self Care Home Management;Aquatic Therapy;Cryotherapy;Electrical Stimulation;Ultrasound;Moist Heat;Iontophoresis 46m/ml Dexamethasone;Therapeutic activities;Therapeutic exercise;Neuromuscular re-education;Manual techniques;Patient/family education;Dry needling;Taping    PT Next Visit Plan progress scapular stabilization and UE strengthening; manual as  needed    PT Home Exercise Plan [id number]    Consulted and Agree with Plan of Care Patient  Patient will benefit from skilled therapeutic intervention in order to improve the following deficits and impairments:  Decreased range of motion,Impaired UE functional use,Pain,Hypomobility,Decreased strength  Visit Diagnosis: Chronic left shoulder pain  Stiffness of left shoulder, not elsewhere classified  Muscle weakness (generalized)     Problem List Patient Active Problem List   Diagnosis Date Noted  . Lumbar spine instability 06/21/2020  . Achalasia s/p Heller myotomy/Toupet 05/03/2015 05/03/2015  . Diabetes mellitus without complication ([place])   . Arthritis    Everardo All PT, DPT  01/16/21 1:34 PM   [facility] [address] [place], [place], [address] Phone: [phone]   Fax:  [phone]  Name: AGUEDA HOUPT MRN: [id number] Date of Birth: 1956/08/08

## 2021-01-16 NOTE — Patient Instructions (Signed)
Wall Clock with Theraband - 1 x daily - 7 x weekly - 1 sets - 10 reps

## 2021-01-23 ENCOUNTER — Other Ambulatory Visit: Payer: Self-pay

## 2021-01-23 ENCOUNTER — Ambulatory Visit: Payer: Medicare Other | Admitting: Physical Therapy

## 2021-01-23 DIAGNOSIS — M6281 Muscle weakness (generalized): Secondary | ICD-10-CM

## 2021-01-23 DIAGNOSIS — G8929 Other chronic pain: Secondary | ICD-10-CM

## 2021-01-23 DIAGNOSIS — M25512 Pain in left shoulder: Secondary | ICD-10-CM | POA: Diagnosis not present

## 2021-01-23 DIAGNOSIS — M25612 Stiffness of left shoulder, not elsewhere classified: Secondary | ICD-10-CM

## 2021-01-23 NOTE — Therapy (Signed)
[facility] [address], [place], [place], [address] Phone: [phone]   Fax:  [phone]  Physical Therapy Treatment  Patient Details  Name: Melinda Drake MRN: [id number] Date of Birth: 16-Dec-1955 Referring Provider (PT): Dr. Mardelle Matte   Encounter Date: 01/23/2021   PT End of Session - 01/23/21 1456     Visit Number 6    Date for PT Re-Evaluation 02/12/21    Authorization Type Medicare    Authorization Time Period KX at 15 visits    Progress Note Due on Visit 10    PT Start Time 1148    PT Stop Time 1230    PT Time Calculation (min) 42 min    Activity Tolerance Patient tolerated treatment well;No increased pain    Behavior During Therapy WFL for tasks assessed/performed             Past Medical History:  Diagnosis Date   Arthritis    Bronchitis    Diabetes mellitus without complication ([place])    Family history of adverse reaction to anesthesia    N/V    GERD (gastroesophageal reflux disease)    H/O stem cell transplant ([place]) 1997   History of biopsy 1997   left side neck   Non Hodgkin's lymphoma ([place]) 1997   PONV (postoperative nausea and vomiting)     Past Surgical History:  Procedure Laterality Date   ANKLE FUSION Left 04/27/2013   Procedure: ANKLE FUSION- left;  Surgeon: Newt Minion, MD;  Location: [place];  Service: Orthopedics;  Laterality: Left;  Left Subtalar Fusion and Talonavicular Fusion   ESOPHAGEAL MANOMETRY N/A 03/05/2015   Procedure: ESOPHAGEAL MANOMETRY (EM);  Surgeon: Clarene Essex, MD;  [facility];  Service: Endoscopy;  Laterality: N/A;   EYE SURGERY  ? early 2000's   bilateral cataracts   LAPAROSCOPIC LYSIS OF ADHESIONS  05/03/2015   Procedure: LAPAROSCOPIC LYSIS OF ADHESIONS;  Surgeon: Michael Boston, MD;  Location: [facility];  Service: General;;   lymphoma excision Left 1996    There were no vitals filed for this visit.   Subjective Assessment - 01/23/21 1453      Subjective Continues to notice improvement. Feels 50% improved    Pertinent History problem right  shoulder a few years ago resolved in 2 months;  LB surgery last November good result    Limitations House hold activities;Lifting    Patient Stated Goals normal ROM and no pain    Currently in Pain? No/denies                [facility] PT Assessment - 01/23/21 0001       AROM   Left Shoulder Flexion 130 Degrees    Left Shoulder ABduction 120 Degrees                           [facility] Adult PT Treatment/Exercise - 01/23/21 0001       Shoulder Exercises: Supine   Protraction Strengthening;Left;15 reps    Protraction Weight (lbs) 2    Flexion AROM;Left;15 reps    Other Supine Exercises weighted circles; x20 circles counter/clockwise; 2#      Shoulder Exercises: Sidelying   External Rotation Strengthening;Left    External Rotation Weight (lbs) x10 reps; x10 reps 2#    ABduction AROM;Left;20 reps      Shoulder Exercises: ROM/Strengthening   Other ROM/Strengthening Exercises pec stretch at doorway; 2 x 20s  Manual Therapy   Joint Mobilization Grade 1-4 Lt GH joint  inferior, anterior, posterior    Passive ROM Lt shoulder all directions with gentle oscillations for decreased muscle guarding and gentle overpressue at end range                    PT Education - 01/23/21 1453     Education Details doorway pec stretch; thoracic extension in chair    Person(s) Educated Patient    Methods Explanation;Demonstration;Tactile cues;Verbal cues;Handout    Comprehension Verbalized understanding;Returned demonstration;Verbal cues required;Tactile cues required              PT Short Term Goals - 01/23/21 1455       PT SHORT TERM GOAL #1   Title The patient will demonstrate knowledge of basic ROM HEP for restoring shoulder mobility    Time 4    Period Weeks    Status Achieved    Target Date 01/15/21      PT SHORT TERM GOAL #2   Title The patient will  have improved left shoulder flexion to 130 and scaption/abduction to 140 degrees    Time 4    Period Weeks    Status Partially Met   flexion 130; abduction 120     PT SHORT TERM GOAL #3   Title The patient will report a 30% improvement in pain with dressing and grooming tasks    Time 4    Period Weeks    Status Achieved   50%              PT Long Term Goals - 12/18/20 2129       PT LONG TERM GOAL #1   Title The patient will be independent in safe self progression of HEP and/or gym program for shoulder ROM and strength    Time 8    Period Weeks    Status New    Target Date 02/12/21      PT LONG TERM GOAL #2   Title The patient will have improved left shoulder elevation/scaption to 150 degrees needed for reaching overhead    Time 8    Period Weeks    Status New      PT LONG TERM GOAL #3   Title The patient will have improved left glenohumeral and scapular strength to grossly 4/5 needed for lifting/carrying light to medium objects    Time 8    Period Weeks    Status New      PT LONG TERM GOAL #4   Title The patient will report a 60% improvement in left shoulder pain and function    Time 8    Period Weeks    Status New      PT LONG TERM GOAL #5   Title FOTO function score improved to 73%    Time 8    Period Weeks    Status New                   Plan - 01/23/21 1454     Clinical Impression Statement Patient demonstrates improved abduction AROM as she demonstrates 120 degrees abduction. Continues to require verbal cues for scapular mechanics with Lt UE elevation into flexion and abduction. Would benefit from continued skilled intervention to address impairments for improved functional use of Lt UE.    Personal Factors and Comorbidities Age;Comorbidity 1;Comorbidity 2;Sex    Comorbidities risk factors for adhesive capsulitis: female, age, diabetes; previous history of other shoulder  issue; OA knees; HTN    Examination-Activity Limitations Reach  Overhead;Lift;Hygiene/Grooming;Dressing    Examination-Participation Restrictions Other;Meal Prep    Rehab Potential Good    PT Frequency 1x / week    PT Duration 8 weeks    PT Treatment/Interventions ADLs/Self Care Home Management;Aquatic Therapy;Cryotherapy;Electrical Stimulation;Ultrasound;Moist Heat;Iontophoresis 54m/ml Dexamethasone;Therapeutic activities;Therapeutic exercise;Neuromuscular re-education;Manual techniques;Patient/family education;Dry needling;Taping    PT Next Visit Plan AROM and AAROM; continue [place]ur[place] Lak[place]obs and other manual techniques; scapular stabilization    PT Home Exercise Plan [id number]    Consulted and Agree with Plan of Care Patient             Patient will benefit from skilled therapeutic intervention in order to improve the following deficits and impairments:  Decreased range of motion, Impaired UE functional use, Pain, Hypomobility, Decreased strength  Visit Diagnosis: Chronic left shoulder pain  Muscle weakness (generalized)  Stiffness of left shoulder, not elsewhere classified     Problem List Patient Active Problem List   Diagnosis Date Noted   Lumbar spine instability 06/21/2020   Achalasia s/p Heller myotomy/Toupet 05/03/2015 05/03/2015   Diabetes mellitus without complication (HCC)    Arthritis     KEverardo AllPT, DPT  01/23/21 4:21 PM    [facility] [address] [place] [address]Phone: [phone]  Fax:  [phone] Name: BMARYON KEMNITZMRN: [id number] of Birth: [birth year]

## 2021-01-23 NOTE — Patient Instructions (Signed)
Doorway Pec Stretch at 60 Degrees Abduction with Arm Straight - 1 x daily - 7 x weekly - 1 sets - 2 reps - 20s hold Seated Thoracic Lumbar Extension - 1 x daily - 7 x weekly - 1 sets - 3 reps - 10s hold

## 2021-01-30 ENCOUNTER — Ambulatory Visit: Payer: Medicare Other | Admitting: Physical Therapy

## 2021-01-30 ENCOUNTER — Other Ambulatory Visit: Payer: Self-pay

## 2021-01-30 DIAGNOSIS — M25612 Stiffness of left shoulder, not elsewhere classified: Secondary | ICD-10-CM

## 2021-01-30 DIAGNOSIS — M25512 Pain in left shoulder: Secondary | ICD-10-CM | POA: Diagnosis not present

## 2021-01-30 DIAGNOSIS — M6281 Muscle weakness (generalized): Secondary | ICD-10-CM

## 2021-01-30 NOTE — Therapy (Addendum)
[facility] [address], [place], [place], [address] Phone: [phone]   Fax:  [phone]  Physical Therapy Treatment  Patient Details  Name: Melinda Drake MRN: [id number] Date of Birth: 01-08-1956 Referring Provider (PT): Dr. Mardelle Matte   Encounter Date: 01/30/2021   PT End of Session - 01/30/21 1323     Visit Number 7    Date for PT Re-Evaluation 02/12/21    Authorization Type Medicare    Authorization Time Period KX at 15 visits    Progress Note Due on Visit 10    PT Start Time 1148    PT Stop Time 1228    PT Time Calculation (min) 40 min    Activity Tolerance Patient tolerated treatment well;No increased pain    Behavior During Therapy WFL for tasks assessed/performed             Past Medical History:  Diagnosis Date   Arthritis    Bronchitis    Diabetes mellitus without complication ([place])    Family history of adverse reaction to anesthesia    N/V    GERD (gastroesophageal reflux disease)    H/O stem cell transplant ([place]) 1997   History of biopsy 1997   left side neck   Non Hodgkin's lymphoma ([place]) 1997   PONV (postoperative nausea and vomiting)     Past Surgical History:  Procedure Laterality Date   ANKLE FUSION Left 04/27/2013   Procedure: ANKLE FUSION- left;  Surgeon: Newt Minion, MD;  Location: [facility];  Service: Orthopedics;  Laterality: Left;  Left Subtalar Fusion and Talonavicular Fusion   ESOPHAGEAL MANOMETRY N/A 03/05/2015   Procedure: ESOPHAGEAL MANOMETRY (EM);  Surgeon: Clarene Essex, MD;  [facility];  Service: Endoscopy;  Laterality: N/A;   EYE SURGERY  ? early 2000's   bilateral cataracts   LAPAROSCOPIC LYSIS OF ADHESIONS  05/03/2015   Procedure: LAPAROSCOPIC LYSIS OF ADHESIONS;  Surgeon: Michael Boston, MD;  [facility];  Service: General;;   lymphoma excision Left 1996    There were no vitals filed for this visit.   Subjective Assessment - 01/30/21 1151      Subjective "It's doing much better"    Pertinent History problem right  shoulder a few years ago resolved in 2 months;  LB surgery last November good result    Limitations House hold activities;Lifting    Patient Stated Goals normal ROM and no pain    Currently in Pain? No/denies                [facility] PT Assessment - 01/30/21 0001       AROM   Left Shoulder Flexion 125 Degrees    Left Shoulder ABduction 120 Degrees                           [facility] Adult PT Treatment/Exercise - 01/30/21 0001       Shoulder Exercises: Seated   Flexion Both;10 reps    Flexion Weight (lbs) 1    Flexion Limitations scaption x10 Lt/Rt; 1#    Abduction Both;10 reps    ABduction Weight (lbs) 1      Shoulder Exercises: Standing   Internal Rotation Left;10 reps;AAROM   using towel   Flexion AAROM;Both;15 reps    Shoulder Flexion Weight (lbs) towel slides    Shoulder Elevation Limitations shelf reach waist to overhead; x10 Lt UE    Other Standing Exercises wall clocks; blue  loop; x12 reps Lt only    Other Standing Exercises scaption towel slides x10 Lt only      Shoulder Exercises: ROM/Strengthening   UBE (Upper Arm Bike) L1.3; 6 minutes 3/3; PT present to discuss progress and assess response to activity    Lat Pull Limitations 25#; x12 reps; cuing for decreased scapular elevation      Shoulder Exercises: Stretch   Other Shoulder Stretches corner pec stretch; 2 x 20s    Other Shoulder Stretches doorway ER stretch; 2 x 20s      Shoulder Exercises: Power [employer] 10 reps    Row Limitations Lt UE; 20      Manual Therapy   Joint Mobilization Grade 1-4 Lt GH joint  inferior, anterior, posterior    Passive ROM Lt shoulder all directions with gentle oscillations for decreased muscle guarding and gentle overpressue at end range                      PT Short Term Goals - 01/30/21 1322       PT SHORT TERM GOAL #1   Title The patient will demonstrate knowledge of  basic ROM HEP for restoring shoulder mobility    Time 4    Period Weeks    Status Achieved    Target Date 01/15/21      PT SHORT TERM GOAL #2   Title The patient will have improved left shoulder flexion to 130 and scaption/abduction to 140 degrees    Time 4    Period Weeks    Status On-going      PT SHORT TERM GOAL #3   Title The patient will report a 30% improvement in pain with dressing and grooming tasks    Time 4    Period Weeks    Status Achieved               PT Long Term Goals - 01/30/21 1323       PT LONG TERM GOAL #1   Title The patient will be independent in safe self progression of HEP and/or gym program for shoulder ROM and strength    Time 8    Period Weeks    Status On-going      PT LONG TERM GOAL #2   Title The patient will have improved left shoulder elevation/scaption to 150 degrees needed for reaching overhead    Time 8    Period Weeks    Status On-going      PT LONG TERM GOAL #3   Title The patient will have improved left glenohumeral and scapular strength to grossly 4/5 needed for lifting/carrying light to medium objects    Time 8    Period Weeks    Status On-going      PT LONG TERM GOAL #4   Title The patient will report a 60% improvement in left shoulder pain and function    Time 8    Period Weeks    Status On-going      PT LONG TERM GOAL #5   Title FOTO function score improved to 73%    Time 8    Period Weeks    Status On-going                   Plan - 01/30/21 1321     Clinical Impression Statement Patient's Lt shoulder AROM grossly unchanged this date though patient reports decreased pain with AROM in all directions. Therapist noting  increased tension of anterior GH joint capsule with manual assessment this date. Would benefit from continued skilled intervention to address impairments for improved functional use of Lt UE.    Personal Factors and Comorbidities Age;Comorbidity 1;Comorbidity 2;Sex    Comorbidities risk  factors for adhesive capsulitis: female, age, diabetes; previous history of other shoulder issue; OA knees; HTN    Examination-Activity Limitations Reach Overhead;Lift;Hygiene/Grooming;Dressing    Examination-Participation Restrictions Other;Meal Prep    Rehab Potential Good    PT Frequency 1x / week    PT Duration 8 weeks    PT Treatment/Interventions ADLs/Self Care Home Management;Aquatic Therapy;Cryotherapy;Electrical Stimulation;Ultrasound;Moist Heat;Iontophoresis 4mg /ml Dexamethasone;Therapeutic activities;Therapeutic exercise;Neuromuscular re-education;Manual techniques;Patient/family education;Dry needling;Taping    PT Next Visit Plan AROM and AAROM; continue [place] mobs and other manual techniques; scapular stabilization    PT Home Exercise Plan [id number]    Consulted and Agree with Plan of Care Patient             Patient will benefit from skilled therapeutic intervention in order to improve the following deficits and impairments:  Decreased range of motion, Impaired UE functional use, Pain, Hypomobility, Decreased strength  Visit Diagnosis: Chronic left shoulder pain  Muscle weakness (generalized)  Stiffness of left shoulder, not elsewhere classified     Problem List Patient Active Problem List   Diagnosis Date Noted   Lumbar spine instability 06/21/2020   Achalasia s/p Heller myotomy/Toupet 05/03/2015 05/03/2015   Diabetes mellitus without complication (HCC)    Arthritis      Everardo All PT, DPT  02/19/21 1:25 PM    [facility] [address] [place], [place], [address] Phone: [phone]   Fax:  [phone]  Name: Melinda Drake MRN: [id number] Date of Birth: 02-28-1956

## 2021-02-06 ENCOUNTER — Ambulatory Visit: Payer: Medicare Other | Admitting: Physical Therapy

## 2021-02-20 ENCOUNTER — Other Ambulatory Visit: Payer: Self-pay

## 2021-02-20 ENCOUNTER — Ambulatory Visit: Payer: Medicare Other | Attending: Orthopedic Surgery | Admitting: Physical Therapy

## 2021-02-20 DIAGNOSIS — M6281 Muscle weakness (generalized): Secondary | ICD-10-CM | POA: Diagnosis present

## 2021-02-20 DIAGNOSIS — M25512 Pain in left shoulder: Secondary | ICD-10-CM | POA: Insufficient documentation

## 2021-02-20 DIAGNOSIS — M25612 Stiffness of left shoulder, not elsewhere classified: Secondary | ICD-10-CM | POA: Diagnosis present

## 2021-02-20 DIAGNOSIS — G8929 Other chronic pain: Secondary | ICD-10-CM | POA: Insufficient documentation

## 2021-02-20 NOTE — Therapy (Signed)
[facility] [address] [place], [place], [address] Phone: [phone]   Fax:  [phone]  Physical Therapy Treatment  Patient Details  Name: Melinda Drake MRN: [id number] Date of Birth: 1956/03/24 Referring Provider (PT): Dr. Mardelle Matte   Encounter Date: 02/20/2021   PT End of Session - 02/20/21 1219     Visit Number 8    Date for PT Re-Evaluation 02/12/21    Authorization Type Medicare    Authorization Time Period KX at 15 visits    Progress Note Due on Visit 10    PT Start Time 1148    PT Stop Time 1215    PT Time Calculation (min) 27 min    Activity Tolerance Patient tolerated treatment well;No increased pain    Behavior During Therapy WFL for tasks assessed/performed             Past Medical History:  Diagnosis Date   Arthritis    Bronchitis    Diabetes mellitus without complication ([place])    Family history of adverse reaction to anesthesia    N/V    GERD (gastroesophageal reflux disease)    H/O stem cell transplant ([place]) 1997   History of biopsy 1997   left side neck   Non Hodgkin's lymphoma ([place]) 1997   PONV (postoperative nausea and vomiting)     Past Surgical History:  Procedure Laterality Date   ANKLE FUSION Left 04/27/2013   Procedure: ANKLE FUSION- left;  Surgeon: Newt Minion, MD;  Location: [facility];  Service: Orthopedics;  Laterality: Left;  Left Subtalar Fusion and Talonavicular Fusion   ESOPHAGEAL MANOMETRY N/A 03/05/2015   Procedure: ESOPHAGEAL MANOMETRY (EM);  Surgeon: Clarene Essex, MD;  [facility];  Service: Endoscopy;  Laterality: N/A;   EYE SURGERY  ? early 2000's   bilateral cataracts   LAPAROSCOPIC LYSIS OF ADHESIONS  05/03/2015   Procedure: LAPAROSCOPIC LYSIS OF ADHESIONS;  Surgeon: Michael Boston, MD;  [facility];  Service: General;;   lymphoma excision Left 1996    There were no vitals filed for this visit.   Subjective Assessment - 02/20/21 1152      Subjective Has been away on vacation. Feels that she is ready to D/C. Report intermittent "twinges in shoulder" but overall she feels better.    Pertinent History problem right  shoulder a few years ago resolved in 2 months;  LB surgery last November good result    Limitations House hold activities;Lifting    Patient Stated Goals normal ROM and no pain    Currently in Pain? No/denies                [facility] PT Assessment - 02/20/21 0001       Assessment   Medical Diagnosis left adhesive capsulitis    Referring Provider (PT) Dr. Mardelle Matte    Onset Date/Surgical Date --   3-4 months   Prior Therapy Yes      Precautions   Precautions None      Restrictions   Weight Bearing Restrictions No      Balance Screen   Has the patient fallen in the past 6 months No    Has the patient had a decrease in activity level because of a fear of falling?  No    Is the patient reluctant to leave their home because of a fear of falling?  No      Prior Function   Level of Independence Independent with basic ADLs  Vocation Retired    Leisure go to movies; travel; concerts      Cognition   Overall Cognitive Status Within Functional Limits for tasks assessed      Observation/Other Assessments   Focus on Therapeutic Outcomes (FOTO)  68      AROM   Overall AROM Comments Lt scaption 128    Left Shoulder Flexion 130 Degrees    Left Shoulder ABduction 117 Degrees      Strength   Left Shoulder Flexion 4+/5    Left Shoulder ABduction 4+/5    Left Shoulder Internal Rotation 4+/5    Left Shoulder External Rotation 4+/5                           [facility] Adult PT Treatment/Exercise - 02/20/21 0001       Shoulder Exercises: Seated   Flexion AROM;20 reps    Flexion Limitations manual facilitation of scapular mechanics    Abduction AROM;20 reps    ABduction Limitations manual facilitation of scapular mechanics      Shoulder Exercises: ROM/Strengthening   UBE (Upper Arm Bike) L2: 4  minutes, 2/2                      PT Short Term Goals - 02/20/21 1208       PT SHORT TERM GOAL #1   Title The patient will demonstrate knowledge of basic ROM HEP for restoring shoulder mobility    Time 4    Period Weeks    Status Achieved    Target Date 01/15/21      PT SHORT TERM GOAL #2   Title The patient will have improved left shoulder flexion to 130 and scaption/abduction to 140 degrees    Time 4    Period Weeks    Status Partially Met      PT SHORT TERM GOAL #3   Title The patient will report a 30% improvement in pain with dressing and grooming tasks    Time 4    Period Weeks    Status Achieved               PT Long Term Goals - 02/20/21 1200       PT LONG TERM GOAL #1   Title The patient will be independent in safe self progression of HEP and/or gym program for shoulder ROM and strength    Time 8    Period Weeks    Status Achieved      PT LONG TERM GOAL #2   Title The patient will have improved left shoulder elevation/scaption to 150 degrees needed for reaching overhead    Time 8    Period Weeks    Status Not Met   flexion 130, scaption 128, abduction 117     PT LONG TERM GOAL #3   Title The patient will have improved left glenohumeral and scapular strength to grossly 4/5 needed for lifting/carrying light to medium objects    Time 8    Period Weeks    Status Achieved   flex/abd/IR/ER 4+/5     PT LONG TERM GOAL #4   Title The patient will report a 60% improvement in left shoulder pain and function    Time 8    Period Weeks    Status Achieved      PT LONG TERM GOAL #5   Title FOTO function score improved to 73%    Time 8  Period Weeks    Status Not Met   68                  Plan - 02/20/21 1212     Clinical Impression Statement Patient is a 65 y/o female referred due to adhesive capsulitis of Lt shoulder. She demonstrates improved Lt shoulder AROM with regards to flexion, abduction, and scaption. Patient exhibits  improved strength of Lt glenohumeral joint. She subjectively reports 100% improved functional of Lt UE with regards to completing ADLs and other functional activities. Lt shoulder AROM continues to be below functional limits however, ROM is equal to unaffected UE. Patient is expected to make continued progress with provided HEP. Safe to D/C to HEP.    Personal Factors and Comorbidities Age;Comorbidity 1;Comorbidity 2;Sex    Comorbidities risk factors for adhesive capsulitis: female, age, diabetes; previous history of other shoulder issue; OA knees; HTN    Examination-Activity Limitations Reach Overhead;Lift;Hygiene/Grooming;Dressing    Examination-Participation Restrictions Other;Meal Prep    Stability/Clinical Decision Making Stable/Uncomplicated    Clinical Decision Making Low    Rehab Potential Good    PT Frequency 1x / week    PT Duration 8 weeks    PT Treatment/Interventions ADLs/Self Care Home Management;Aquatic Therapy;Cryotherapy;Electrical Stimulation;Ultrasound;Moist Heat;Iontophoresis 73m/ml Dexamethasone;Therapeutic activities;Therapeutic exercise;Neuromuscular re-education;Manual techniques;Patient/family education;Dry needling;Taping    PT Next Visit Plan D/C to HEP    PT Home Exercise Plan [id number]    Consulted and Agree with Plan of Care Patient             Patient will benefit from skilled therapeutic intervention in order to improve the following deficits and impairments:  Decreased range of motion, Impaired UE functional use, Pain, Hypomobility, Decreased strength  Visit Diagnosis: Chronic left shoulder pain  Muscle weakness (generalized)  Stiffness of left shoulder, not elsewhere classified     Problem List Patient Active Problem List   Diagnosis Date Noted   Lumbar spine instability 06/21/2020   Achalasia s/p Heller myotomy/Toupet 05/03/2015 05/03/2015   Diabetes mellitus without complication ([place]    Arthritis     PHYSICAL THERAPY DISCHARGE  SUMMARY  Visits from Start of Care: 8  Current functional level related to goals / functional outcomes: See above   Remaining deficits: See above   Education / Equipment: See above   Patient agrees to discharge. Patient goals were partially met. Patient is being discharged due to being pleased with the current functional level.  KEverardo AllPT, DPT  02/20/21 12:20 PM   [facility] [address] [place] [address]Phone: [phone]  Fax:  [phone] Name: BMARKITA STCHARLESMRN: [id number] of Birth: [birth year]

## 2021-02-26 ENCOUNTER — Encounter: Payer: Medicare Other | Admitting: Physical Therapy

## 2021-03-06 ENCOUNTER — Encounter: Payer: Medicare Other | Admitting: Physical Therapy

## 2021-05-13 ENCOUNTER — Other Ambulatory Visit (HOSPITAL_BASED_OUTPATIENT_CLINIC_OR_DEPARTMENT_OTHER): Payer: Self-pay

## 2021-05-16 ENCOUNTER — Other Ambulatory Visit (HOSPITAL_BASED_OUTPATIENT_CLINIC_OR_DEPARTMENT_OTHER): Payer: Self-pay

## 2021-05-16 ENCOUNTER — Ambulatory Visit: Payer: Medicare Other | Attending: Internal Medicine

## 2021-05-16 DIAGNOSIS — Z23 Encounter for immunization: Secondary | ICD-10-CM

## 2021-05-16 MED ORDER — COVID-19 MRNA VACC (MODERNA) 100 MCG/0.5ML IM SUSP
INTRAMUSCULAR | 0 refills | Status: DC
  Filled 2021-05-16: qty 0.5, 1d supply, fill #0

## 2021-05-16 NOTE — Progress Notes (Signed)
   Covid-19 Vaccination Clinic  Name:  Melinda Drake    MRN: [id number] DOB: 1956-02-01  05/16/2021  [name] was observed post Covid-19 immunization for 15 minutes without incident. She was provided with Vaccine Information Sheet and instruction to access the V-Safe system.   [name] was instructed to call 911 with any severe reactions post vaccine: Difficulty breathing  Swelling of face and throat  A fast heartbeat  A bad rash all over body  Dizziness and weakness

## 2021-05-31 ENCOUNTER — Other Ambulatory Visit: Payer: Self-pay | Admitting: Gastroenterology

## 2021-06-04 ENCOUNTER — Ambulatory Visit (HOSPITAL_COMMUNITY): Payer: Medicare Other | Admitting: Certified Registered Nurse Anesthetist

## 2021-06-04 ENCOUNTER — Encounter (HOSPITAL_COMMUNITY)
Admission: RE | Disposition: A | Discharge status: Home / Self Care | Payer: Self-pay | Source: Home / Self Care | Attending: Gastroenterology

## 2021-06-04 ENCOUNTER — Other Ambulatory Visit: Payer: Self-pay

## 2021-06-04 ENCOUNTER — Encounter (HOSPITAL_COMMUNITY): Payer: Self-pay | Admitting: Gastroenterology

## 2021-06-04 ENCOUNTER — Ambulatory Visit (HOSPITAL_COMMUNITY)
Admission: RE | Admit: 2021-06-04 | Discharge: 2021-06-04 | Disposition: A | Discharge status: Home / Self Care | Payer: Medicare Other | Attending: Gastroenterology | Admitting: Gastroenterology

## 2021-06-04 DIAGNOSIS — E119 Type 2 diabetes mellitus without complications: Secondary | ICD-10-CM | POA: Insufficient documentation

## 2021-06-04 DIAGNOSIS — K449 Diaphragmatic hernia without obstruction or gangrene: Secondary | ICD-10-CM | POA: Diagnosis not present

## 2021-06-04 DIAGNOSIS — Z794 Long term (current) use of insulin: Secondary | ICD-10-CM | POA: Diagnosis not present

## 2021-06-04 DIAGNOSIS — K220 Achalasia of cardia: Secondary | ICD-10-CM | POA: Diagnosis present

## 2021-06-04 DIAGNOSIS — Z87891 Personal history of nicotine dependence: Secondary | ICD-10-CM | POA: Insufficient documentation

## 2021-06-04 HISTORY — PX: BOTOX INJECTION: SHX5754

## 2021-06-04 HISTORY — PX: ESOPHAGOGASTRODUODENOSCOPY (EGD) WITH PROPOFOL: SHX5813

## 2021-06-04 LAB — GLUCOSE, CAPILLARY
Glucose-Capillary: 119 mg/dL — ABNORMAL HIGH (ref 70–99)
Glucose-Capillary: 61 mg/dL — ABNORMAL LOW (ref 70–99)
Glucose-Capillary: 84 mg/dL (ref 70–99)
Glucose-Capillary: 94 mg/dL (ref 70–99)

## 2021-06-04 SURGERY — ESOPHAGOGASTRODUODENOSCOPY (EGD) WITH PROPOFOL
Anesthesia: Monitor Anesthesia Care

## 2021-06-04 MED ORDER — LACTATED RINGERS IV SOLN
INTRAVENOUS | Status: DC
  Administered 2021-06-04: 1000 mL via INTRAVENOUS

## 2021-06-04 MED ORDER — DEXTROSE 50 % IV SOLN
INTRAVENOUS | Status: DC | PRN
  Administered 2021-06-04: .5 via INTRAVENOUS

## 2021-06-04 MED ORDER — DEXTROSE 50 % IV SOLN
12.5000 g | INTRAVENOUS | Status: AC
  Administered 2021-06-04: 12.5 g via INTRAVENOUS

## 2021-06-04 MED ORDER — DEXTROSE 50 % IV SOLN
INTRAVENOUS | Status: AC
  Filled 2021-06-04: qty 50

## 2021-06-04 MED ORDER — LIDOCAINE 2% (20 MG/ML) 5 ML SYRINGE
INTRAMUSCULAR | Status: DC | PRN
  Administered 2021-06-04: 60 mg via INTRAVENOUS

## 2021-06-04 MED ORDER — SODIUM CHLORIDE (PF) 0.9 % IJ SOLN
INTRAMUSCULAR | Status: DC | PRN
  Administered 2021-06-04: 4 mL via SUBMUCOSAL

## 2021-06-04 MED ORDER — PROPOFOL 500 MG/50ML IV EMUL
INTRAVENOUS | Status: DC | PRN
  Administered 2021-06-04: 100 ug/kg/min via INTRAVENOUS

## 2021-06-04 MED ORDER — PROPOFOL 10 MG/ML IV BOLUS
INTRAVENOUS | Status: DC | PRN
  Administered 2021-06-04: 50 mg via INTRAVENOUS

## 2021-06-04 MED ORDER — ONABOTULINUMTOXINA 100 UNITS IJ SOLR
INTRAMUSCULAR | Status: AC
  Filled 2021-06-04: qty 100

## 2021-06-04 SURGICAL SUPPLY — 14 items

## 2021-06-04 NOTE — Transfer of Care (Signed)
Immediate Anesthesia Transfer of Care Note  Patient: Melinda Drake  Procedure(s) Performed: Procedure(s): ESOPHAGOGASTRODUODENOSCOPY (EGD) WITH PROPOFOL (N/A) BOTOX INJECTION  Patient Location: PACU  Anesthesia Type:MAC  Level of Consciousness: Patient easily awoken, sedated, comfortable, cooperative, following commands, responds to stimulation.   Airway & Oxygen Therapy: Patient spontaneously breathing, ventilating well, oxygen via simple oxygen mask.  Post-op Assessment: Report given to PACU RN, vital signs reviewed and stable, moving all extremities.   Post vital signs: Reviewed and stable.  Complications: No apparent anesthesia complications  Last Vitals:  Vitals Value Taken Time  BP 119/51 06/04/21 1350  Temp    Pulse 82 06/04/21 1351  Resp 19 06/04/21 1351  SpO2 100 % 06/04/21 1351  Vitals shown include unvalidated device data.  Last Pain:  Vitals:   06/04/21 1349  TempSrc:   PainSc: 0-No pain         Complications: No notable events documented.

## 2021-06-04 NOTE — Op Note (Signed)
[facility] Patient Name: Melinda Drake Procedure Date: 06/04/2021 MRN: [id number] Attending MD: Clarene Essex , MD Date of Birth: [birth year] CSN: [id number] Age: 65 Admit Type: Outpatient Procedure:                Upper GI endoscopy Indications:              For therapy of achalasia, For botulinum toxin                            injection of achalasia Providers:                Clarene Essex, MD, Dulcy Fanny, Luan Moore,                            Technician, Heide Scales, CRNA Referring MD:              Medicines:                Propofol total dose 160 mg IV, 60 mg IV lidocaine Complications:            No immediate complications. Estimated Blood Loss:     Estimated blood loss: none. Procedure:                Pre-Anesthesia Assessment:                           - Prior to the procedure, a History and Physical                            was performed, and patient medications and                            allergies were reviewed. The patient's tolerance of                            previous anesthesia was also reviewed. The risks                            and benefits of the procedure and the sedation                            options and risks were discussed with the patient.                            All questions were answered, and informed consent                            was obtained. Prior Anticoagulants: The patient has                            taken no previous anticoagulant or antiplatelet                            agents. ASA Grade Assessment: II - A patient with  mild systemic disease. After reviewing the risks                            and benefits, the patient was deemed in                            satisfactory condition to undergo the procedure.                           After obtaining informed consent, the endoscope was                            passed under direct vision. Throughout the                             procedure, the patient's blood pressure, pulse, and                            oxygen saturations were monitored continuously. The                            GIF-H190 ([id number]) Olympus endoscope was introduced                            through the mouth, and advanced to the second part                            of duodenum. The upper GI endoscopy was somewhat                            difficult due to presence of food. The patient                            tolerated the procedure well. Scope In: Scope Out: Findings:      A small hiatal hernia was present.      Fluid was found in the middle third of the esophagus and in the lower       third of the esophagus. Fluid aspiration was performed.      No appreciable esophageal motility was noted. In addition, a hypertonic       lower esophageal sphincter was found. There was mild resistance to       endoscope advancement into the stomach. The Z-line was regular. The       gastroesophageal junction and cardia were normal on retroflexed view.       Area was successfully injected with 100 units botulinum toxin.      The entire examined stomach was normal.      The ampulla, duodenal bulb, first portion of the duodenum, second       portion of the duodenum, major papilla, area of the papilla and third       portion of the duodenum were normal.      The exam was otherwise without abnormality. Impression:               - Small hiatal hernia.                           -  Fluid in the middle third of the esophagus and in                            the lower third of the esophagus. Fluid aspiration                            performed.                           - Achalasia. Injected with botulinum toxin.                           - Normal stomach.                           - Normal ampulla, duodenal bulb, first portion of                            the duodenum, second portion of the duodenum, major                            papilla, area  of the papilla and third portion of                            the duodenum.                           - The examination was otherwise normal. Moderate Sedation:      Not Applicable - Patient had care per Anesthesia. Recommendation:           - Patient has a contact number available for                            emergencies. The signs and symptoms of potential                            delayed complications were discussed with the                            patient. Return to normal activities tomorrow.                            Written discharge instructions were provided to the                            patient.                           - Soft diet today.                           - Continue present medications.                           - Return to GI clinic in 6 weeks.                           -  Telephone GI clinic if symptomatic PRN. Procedure Code(s):        --- Professional ---                           [phone], Esophagogastroduodenoscopy, flexible,                            transoral; with directed submucosal injection(s),                            any substance Diagnosis Code(s):        --- Professional ---                           K44.9, Diaphragmatic hernia without obstruction or                            gangrene                           K22.0, Achalasia of cardia CPT copyright 2019 [employer]. All rights reserved. The codes documented in this report are preliminary and upon coder review may  be revised to meet current compliance requirements. Clarene Essex, MD 06/04/2021 1:58:24 PM This report has been signed electronically. Number of Addenda: 0

## 2021-06-04 NOTE — Anesthesia Postprocedure Evaluation (Signed)
Anesthesia Post Note  Patient: Melinda Drake  Procedure(s) Performed: ESOPHAGOGASTRODUODENOSCOPY (EGD) WITH PROPOFOL BOTOX INJECTION     Patient location during evaluation: Endoscopy Anesthesia Type: MAC Level of consciousness: awake and alert Pain management: pain level controlled Vital Signs Assessment: post-procedure vital signs reviewed and stable Respiratory status: spontaneous breathing, nonlabored ventilation, respiratory function stable and patient connected to nasal cannula oxygen Cardiovascular status: stable and blood pressure returned to baseline Postop Assessment: no apparent nausea or vomiting Anesthetic complications: no   No notable events documented.  Last Vitals:  Vitals:   06/04/21 1410 06/04/21 1420  BP: (!) 168/87 (!) 160/63  Pulse: 80 79  Resp: 16 14  Temp:    SpO2: 97% 95%    Last Pain:  Vitals:   06/04/21 1420  TempSrc:   PainSc: 0-No pain                 Belenda Cruise P Dickson Kostelnik

## 2021-06-04 NOTE — Progress Notes (Signed)
Melinda Drake 1:20 PM  Subjective: Patient without any new complaints and she was seen recently in the office we rediscussed the procedure  Objective: Vital signs stable afebrile exam please see preassessment evaluation  Assessment: Achalasia  Plan: Okay to proceed with endoscopy and probable Botox with anesthesia assistance  [facility]  office [phone] After 5PM or if no answer call [phone]

## 2021-06-04 NOTE — Anesthesia Preprocedure Evaluation (Signed)
Anesthesia Evaluation  Patient identified by MRN, date of birth, ID band Patient awake    Reviewed: Allergy & Precautions, NPO status , Patient's Chart, lab work & pertinent test results  History of Anesthesia Complications (+) PONV  Airway Mallampati: II  TM Distance: >3 FB Neck ROM: Full    Dental  (+) Teeth Intact   Pulmonary neg pulmonary ROS, former smoker,    Pulmonary exam normal        Cardiovascular negative cardio ROS   Rhythm:Regular Rate:Normal     Neuro/Psych negative neurological ROS  negative psych ROS   GI/Hepatic Neg liver ROS, GERD  Medicated,  Endo/Other  diabetes, Type 2, Insulin Dependent  Renal/GU negative Renal ROS  negative genitourinary   Musculoskeletal  (+) Arthritis , Osteoarthritis,    Abdominal (+)  Abdomen: soft.    Peds  Hematology   Anesthesia Other Findings   Reproductive/Obstetrics                             Anesthesia Physical Anesthesia Plan  ASA: 2  Anesthesia Plan: MAC   Post-op Pain Management:    Induction: Intravenous  PONV Risk Score and Plan: 3 and Propofol infusion and Treatment may vary due to age or medical condition  Airway Management Planned: Simple Face Mask, Natural Airway and Nasal Cannula  Additional Equipment: None  Intra-op Plan:   Post-operative Plan:   Informed Consent: I have reviewed the patients History and Physical, chart, labs and discussed the procedure including the risks, benefits and alternatives for the proposed anesthesia with the patient or authorized representative who has indicated his/her understanding and acceptance.     Dental advisory given  Plan Discussed with: CRNA  Anesthesia Plan Comments:         Anesthesia Quick Evaluation

## 2021-06-04 NOTE — Discharge Instructions (Addendum)
Call if question or problem otherwise continue to stay upright for 2 hours after eating and drink plenty of liquids take small bites and eat slowly and call if no better in 2 to 4 weeks otherwise follow-up in the office in 6 weeks  YOU HAD AN ENDOSCOPIC PROCEDURE TODAY: Refer to the procedure report and other information in the discharge instructions given to you for any specific questions about what was found during the examination. If this information does not answer your questions, please call [place] office at [phone] to clarify.   YOU SHOULD EXPECT: Some feelings of bloating in the abdomen. Passage of more gas than usual. Walking can help get rid of the air that was put into your GI tract during the procedure and reduce the bloating. If you had a lower endoscopy (such as a colonoscopy or flexible sigmoidoscopy) you may notice spotting of blood in your stool or on the toilet paper. Some abdominal soreness may be present for a day or two, also.  DIET: Your first meal following the procedure should be a light meal and then it is ok to progress to your normal diet. A half-sandwich or bowl of soup is an example of a good first meal. Heavy or fried foods are harder to digest and may make you feel nauseous or bloated. Drink plenty of fluids but you should avoid alcoholic beverages for 24 hours. If you had a esophageal dilation, please see attached instructions for diet.    ACTIVITY: Your care partner should take you home directly after the procedure. You should plan to take it easy, moving slowly for the rest of the day. You can resume normal activity the day after the procedure however YOU SHOULD NOT DRIVE, use power tools, machinery or perform tasks that involve climbing or major physical exertion for 24 hours (because of the sedation medicines used during the test).   SYMPTOMS TO REPORT IMMEDIATELY: A gastroenterologist can be reached at any hour. Please call [phone]  for any of the following  symptoms:  Following upper endoscopy (EGD, EUS, ERCP, esophageal dilation) Vomiting of blood or coffee ground material  New, significant abdominal pain  New, significant chest pain or pain under the shoulder blades  Painful or persistently difficult swallowing  New shortness of breath  Black, tarry-looking or red, bloody stools  FOLLOW UP:  If any biopsies were taken you will be contacted by phone or by letter within the next 1-3 weeks. Call [phone]  if you have not heard about the biopsies in 3 weeks.  Please also call with any specific questions about appointments or follow up tests.

## 2021-06-04 NOTE — Anesthesia Procedure Notes (Signed)
Procedure Name: MAC Date/Time: 06/04/2021 1:25 PM Performed by: Deliah Boston, CRNA Pre-anesthesia Checklist: Patient identified, Emergency Drugs available, Suction available and Patient being monitored Patient Re-evaluated:Patient Re-evaluated prior to induction Oxygen Delivery Method: Simple face mask Preoxygenation: Pre-oxygenation with 100% oxygen Placement Confirmation: positive ETCO2 and breath sounds checked- equal and bilateral

## 2021-06-05 ENCOUNTER — Encounter (HOSPITAL_COMMUNITY): Payer: Self-pay | Admitting: Gastroenterology

## 2021-10-24 ENCOUNTER — Other Ambulatory Visit (HOSPITAL_COMMUNITY): Payer: Self-pay | Admitting: Surgery

## 2021-11-01 ENCOUNTER — Other Ambulatory Visit: Payer: Self-pay | Admitting: Family Medicine

## 2021-11-01 DIAGNOSIS — Z1231 Encounter for screening mammogram for malignant neoplasm of breast: Secondary | ICD-10-CM

## 2021-11-05 ENCOUNTER — Ambulatory Visit
Admission: RE | Admit: 2021-11-05 | Discharge: 2021-11-05 | Disposition: A | Discharge status: Home / Self Care | Payer: Medicare Other | Source: Ambulatory Visit | Attending: Family Medicine | Admitting: Family Medicine

## 2021-11-05 DIAGNOSIS — Z1231 Encounter for screening mammogram for malignant neoplasm of breast: Secondary | ICD-10-CM

## 2021-11-07 ENCOUNTER — Ambulatory Visit (HOSPITAL_COMMUNITY)
Admission: RE | Admit: 2021-11-07 | Discharge: 2021-11-07 | Disposition: A | Discharge status: Home / Self Care | Payer: Medicare Other | Source: Ambulatory Visit | Attending: Surgery | Admitting: Surgery

## 2021-11-07 DIAGNOSIS — K220 Achalasia of cardia: Secondary | ICD-10-CM | POA: Diagnosis not present

## 2021-11-07 DIAGNOSIS — R1319 Other dysphagia: Secondary | ICD-10-CM | POA: Insufficient documentation

## 2021-12-26 ENCOUNTER — Other Ambulatory Visit: Payer: Self-pay | Admitting: Gastroenterology

## 2022-01-08 ENCOUNTER — Encounter (HOSPITAL_COMMUNITY)
Admission: RE | Disposition: A | Discharge status: Home / Self Care | Payer: Self-pay | Source: Home / Self Care | Attending: Gastroenterology

## 2022-01-08 ENCOUNTER — Ambulatory Visit (HOSPITAL_COMMUNITY)
Admission: RE | Admit: 2022-01-08 | Discharge: 2022-01-08 | Disposition: A | Discharge status: Home / Self Care | Payer: Medicare Other | Attending: Gastroenterology | Admitting: Gastroenterology

## 2022-01-08 ENCOUNTER — Encounter (HOSPITAL_COMMUNITY): Payer: Self-pay | Admitting: Gastroenterology

## 2022-01-08 DIAGNOSIS — K220 Achalasia of cardia: Secondary | ICD-10-CM | POA: Diagnosis not present

## 2022-01-08 DIAGNOSIS — R1310 Dysphagia, unspecified: Secondary | ICD-10-CM | POA: Diagnosis not present

## 2022-01-08 HISTORY — PX: ESOPHAGEAL MANOMETRY: SHX5429

## 2022-01-08 SURGERY — MANOMETRY, ESOPHAGUS
Anesthesia: Monitor Anesthesia Care | Laterality: Bilateral

## 2022-01-08 MED ORDER — LIDOCAINE VISCOUS HCL 2 % MT SOLN
OROMUCOSAL | Status: AC
  Filled 2022-01-08: qty 15

## 2022-01-08 SURGICAL SUPPLY — 2 items
FACESHIELD LNG OPTICON STERILE (SAFETY) IMPLANT
GLOVE BIO SURGEON STRL SZ8 (GLOVE) ×4 IMPLANT

## 2022-01-08 NOTE — Progress Notes (Signed)
Esophageal Manometry done per protocol. Patient tolerated well without distress or complication.  

## 2022-06-17 ENCOUNTER — Other Ambulatory Visit: Payer: Self-pay | Admitting: Family Medicine

## 2022-06-17 DIAGNOSIS — N289 Disorder of kidney and ureter, unspecified: Secondary | ICD-10-CM

## 2022-07-08 ENCOUNTER — Other Ambulatory Visit: Payer: Medicare Other

## 2022-07-14 ENCOUNTER — Ambulatory Visit
Admission: RE | Admit: 2022-07-14 | Discharge: 2022-07-14 | Disposition: A | Discharge status: Home / Self Care | Payer: Medicare Other | Source: Ambulatory Visit | Attending: Family Medicine | Admitting: Family Medicine

## 2022-07-14 DIAGNOSIS — N289 Disorder of kidney and ureter, unspecified: Secondary | ICD-10-CM

## 2022-07-29 ENCOUNTER — Other Ambulatory Visit: Payer: Self-pay

## 2022-07-29 ENCOUNTER — Ambulatory Visit: Payer: Medicare Other | Attending: Physical Medicine and Rehabilitation

## 2022-07-29 DIAGNOSIS — M5459 Other low back pain: Secondary | ICD-10-CM | POA: Diagnosis present

## 2022-07-29 DIAGNOSIS — R252 Cramp and spasm: Secondary | ICD-10-CM | POA: Insufficient documentation

## 2022-07-29 DIAGNOSIS — R262 Difficulty in walking, not elsewhere classified: Secondary | ICD-10-CM | POA: Diagnosis present

## 2022-07-29 DIAGNOSIS — M6281 Muscle weakness (generalized): Secondary | ICD-10-CM | POA: Diagnosis present

## 2022-07-29 NOTE — Therapy (Signed)
OUTPATIENT PHYSICAL THERAPY THORACOLUMBAR EVALUATION   Patient Name: Melinda Drake MRN: [id number] DOB:03-Oct-1955, 66 y.o., female Today's Date: 07/29/2022  END OF SESSION:  PT End of Session - 07/29/22 1109     Visit Number 1    Date for PT Re-Evaluation 09/23/22    Authorization Type MEDICARE PART A AND B    Progress Note Due on Visit 10    PT Start Time 1103    PT Stop Time 1145    PT Time Calculation (min) 42 min    Activity Tolerance No increased pain    Behavior During Therapy WFL for tasks assessed/performed             Past Medical History:  Diagnosis Date   Arthritis    Bronchitis    Diabetes mellitus without complication ([place])    Family history of adverse reaction to anesthesia    N/V    GERD (gastroesophageal reflux disease)    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   History of biopsy 1997   left side neck   Non Hodgkin's lymphoma ([place]) 1997   PONV (postoperative nausea and vomiting)    Past Surgical History:  Procedure Laterality Date   ANKLE FUSION Left 04/27/2013   Procedure: ANKLE FUSION- left;  Surgeon: Newt Minion, MD;  Location: [place];  Service: Orthopedics;  Laterality: Left;  Left Subtalar Fusion and Talonavicular Fusion   BOTOX INJECTION  06/04/2021   Procedure: BOTOX INJECTION;  Surgeon: Clarene Essex, MD;  Location: [facility];  Service: Endoscopy;;   ESOPHAGEAL MANOMETRY N/A 03/05/2015   Procedure: ESOPHAGEAL MANOMETRY (EM);  Surgeon: Clarene Essex, MD;  Location: [facility];  Service: Endoscopy;  Laterality: N/A;   ESOPHAGEAL MANOMETRY Bilateral 01/08/2022   Procedure: ESOPHAGEAL MANOMETRY (EM);  Surgeon: Clarene Essex, MD;  Location: [facility];  Service: Gastroenterology;  Laterality: Bilateral;   ESOPHAGOGASTRODUODENOSCOPY (EGD) WITH PROPOFOL N/A 06/04/2021   Procedure: ESOPHAGOGASTRODUODENOSCOPY (EGD) WITH PROPOFOL;  Surgeon: Clarene Essex, MD;  Location: [facility];  Service: Endoscopy;  Laterality: N/A;   EYE SURGERY  ? early 2000's    bilateral cataracts   LAPAROSCOPIC LYSIS OF ADHESIONS  05/03/2015   Procedure: LAPAROSCOPIC LYSIS OF ADHESIONS;  Surgeon: Michael Boston, MD;  Location: [facility];  Service: General;;   lymphoma excision Left 1996   Patient Active Problem List   Diagnosis Date Noted   Lumbar spine instability 06/21/2020   Achalasia s/p Heller myotomy/Toupet 05/03/2015 05/03/2015   Diabetes mellitus without complication ([place])    Arthritis     PCP: Lujean Amel, MD  REFERRING PROVIDER: Laroy Apple, MD  REFERRING DIAG: LT S1 Radicular pain with MRI showing  L5-S1 Stenosis  Rationale for Evaluation and Treatment: Rehabilitation  THERAPY DIAG:  Other low back pain  Muscle weakness (generalized)  Cramp and spasm  Difficulty in walking, not elsewhere classified  ONSET DATE: 07/28/2022  SUBJECTIVE:  SUBJECTIVE STATEMENT: Pain in low back and left hip and leg for about 3 months.  She states she is having difficulty with routine daily activities.  Her balance has been affected as well.  She is retired. She enjoys getting out walking and shopping.  She likes to go to the gym.  She has also been dealing with end stage OA bilateral knees and has obvious valgus deformities.  It has been suggested several years ago that she have her knees replaced.  She lives with her sister in a townhome.  Her sister has mobility issues as well.  She has to help with some of her IADL's as well.    PERTINENT HISTORY:  na  PAIN:  Are you having pain? Yes: NPRS scale: 3/10 Pain location: low back and left hip and leg Pain description: aching Aggravating factors: housework, bending, stooping and squatting Relieving factors: meds (gabapentin and ibuprofen)  PRECAUTIONS: None  WEIGHT BEARING RESTRICTIONS: No  FALLS:  Has patient  fallen in last 6 months? Yes. Number of falls 2 (tripped over Christmas tree when decorating and fell in the bathroom)  LIVING ENVIRONMENT: Lives with: lives with their family Lives in: House/apartment Stairs: Yes: Internal: 12 steps; on left going up and External: 2 steps; on left going up Has following equipment at home: None  OCCUPATION: Retired  PLOF: Independent, [place] with basic ADLs, Independent with household mobility without device, Independent with community mobility without device, Independent with homemaking with ambulation, Independent with gait, and Independent with transfers  PATIENT GOALS: To be able to walk pain free, be able to bend down and go back to the gym and work out safely.    NEXT MD VISIT: prn  OBJECTIVE:   DIAGNOSTIC FINDINGS:  MRI showing  L5-S1 Stenosis  PATIENT SURVEYS:  FOTO 41 predicted 42  SCREENING FOR RED FLAGS: Bowel or bladder incontinence: No Spinal tumors: No Cauda equina syndrome: No Compression fracture: No Abdominal aneurysm: No  COGNITION: Overall cognitive status: Within functional limits for tasks assessed     SENSATION: Intermittant radicular symptoms left LE along L5-S1  MUSCLE LENGTH: Hamstrings: Right 60 deg; Left 60 deg Thomas test: Right pos ; Left pos   POSTURE: rounded shoulders, forward head, and decreased lumbar lordosis  PALPATION: Pain located left S.I. area  LUMBAR ROM:   AROM eval  Flexion Fingertips to distal shin  Extension 25% with deviation to left  Right lateral flexion To joint line  Left lateral flexion To joint line  Right rotation 50%  Left rotation 50%   (Blank rows = not tested)  LOWER EXTREMITY ROM:     WFL  LOWER EXTREMITY MMT:    Generally 4 to 4+/5 with exception of bil hip extension and abduction 4-/5  LUMBAR SPECIAL TESTS:  Straight leg raise test: Positive and Slump test: Negative  FUNCTIONAL TESTS:  5 times sit to stand: 20.30 sec Timed up and go (TUG): 13.25  sec  GAIT: Distance walked: 40 Assistive device utilized: None Level of assistance: Complete Independence Comments: antalgic  TODAY'S TREATMENT:  DATE: 07/29/22 Initiated HEP and initial eval completed    PATIENT EDUCATION:  Education details: Initiated HEP Person educated: Patient Education method: Education officer, environmental, Verbal cues, and Handouts Education comprehension: verbalized understanding, returned demonstration, and verbal cues required  HOME EXERCISE PROGRAM: Med bridge down, assigned supine hamstring stretch and hip flexor stretch off edge of bed.   ASSESSMENT:  CLINICAL IMPRESSION: Patient is a 66 y.o. female who was seen today for physical therapy evaluation and treatment for lumbar stenosis. She presents with end stage bilateral knee OA with valgus deformities which likely contribute to poor body mechanics and kinetic chain issues that are affecting her back.  She would benefit from LE flexibility, hip and quad strengthening, and core strengthening.    OBJECTIVE IMPAIRMENTS: decreased knowledge of condition, decreased mobility, difficulty walking, decreased ROM, decreased strength, increased fascial restrictions, increased muscle spasms, impaired flexibility, improper body mechanics, postural dysfunction, and pain.   ACTIVITY LIMITATIONS: carrying, lifting, bending, sitting, standing, squatting, sleeping, stairs, transfers, bathing, and dressing  PARTICIPATION LIMITATIONS: meal prep, cleaning, laundry, driving, shopping, community activity, and yard work  PERSONAL FACTORS: Fitness, Past/current experiences, Time since onset of injury/illness/exacerbation, and 1-2 comorbidities: bronchitis, diabetes  are also affecting patient's functional outcome.   REHAB POTENTIAL: Fair Hx of L4-L5 fusion, but did exercise regularly previously, pain has  been present for about 3 months  CLINICAL DECISION MAKING: Stable/uncomplicated  EVALUATION COMPLEXITY: Low   GOALS: Goals reviewed with patient? Yes  SHORT TERM GOALS: Target date: 08/26/2022   Pain report to be no greater than 4/10  Baseline: Goal status: INITIAL  2.  Patient will be independent with initial HEP  Baseline:  Goal status: INITIAL   LONG TERM GOALS: Target date: 09/23/2022    Patient to be independent with advanced HEP  Baseline:  Goal status: INITIAL  2.  Patient to report pain no greater than 2/10  Baseline:  Goal status: INITIAL  3.  FOTO to improve to predicted score Baseline:  Goal status: INITIAL  4.  Patient to be able to resume gym activities Baseline:  Goal status: INITIAL  5.  Functional scores to improve by 2-3 seconds Baseline:  Goal status: INITIAL  6.  Patient to report 85% improvement in overall symptoms  Baseline:  Goal status: INITIAL  PLAN:  PT FREQUENCY: 1-2x/week  PT DURATION: 8 weeks  PLANNED INTERVENTIONS: Therapeutic exercises, Therapeutic activity, Neuromuscular re-education, Balance training, Gait training, Patient/Family education, Self Care, Joint mobilization, Stair training, DME instructions, Aquatic Therapy, Dry Needling, Electrical stimulation, Spinal mobilization, Cryotherapy, Moist heat, Splintting, Taping, Traction, Ultrasound, Ionotophoresis '4mg'$ /ml Dexamethasone, Manual therapy, and Re-evaluation.  PLAN FOR NEXT SESSION: Review HEP, Initiate core strengthening   Geraldyne Barraclough B. Kahiau Schewe, PT 07/29/22 9:40 PM [facility] [address], [place], [place] [address] Phone # [phone] Fax [phone]

## 2022-07-31 ENCOUNTER — Ambulatory Visit: Payer: Medicare Other | Admitting: Physical Therapy

## 2022-07-31 ENCOUNTER — Encounter: Payer: Self-pay | Admitting: Physical Therapy

## 2022-07-31 DIAGNOSIS — M6281 Muscle weakness (generalized): Secondary | ICD-10-CM

## 2022-07-31 DIAGNOSIS — M5459 Other low back pain: Secondary | ICD-10-CM

## 2022-07-31 DIAGNOSIS — R252 Cramp and spasm: Secondary | ICD-10-CM

## 2022-07-31 NOTE — Therapy (Signed)
OUTPATIENT PHYSICAL THERAPY TREATMENT NOTE   Patient Name: Melinda Drake MRN: [id number] DOB:1956/06/21, 66 y.o., female Today's Date: 07/31/2022  PCP: Lujean Amel, MD REFERRING PROVIDER: Laroy Apple, MD  END OF SESSION:   PT End of Session - 07/31/22 1233     Visit Number 2    Date for PT Re-Evaluation 09/23/22    Authorization Type MEDICARE PART A AND B    Progress Note Due on Visit 10    PT Start Time 1233    PT Stop Time 1315    PT Time Calculation (min) 42 min    Activity Tolerance Patient tolerated treatment well    Behavior During Therapy WFL for tasks assessed/performed             Past Medical History:  Diagnosis Date   Arthritis    Bronchitis    Diabetes mellitus without complication ([place])    Family history of adverse reaction to anesthesia    N/V    GERD (gastroesophageal reflux disease)    H/O stem cell transplant ([place]) 1997   History of biopsy 1997   left side neck   Non Hodgkin's lymphoma ([place]) 1997   PONV (postoperative nausea and vomiting)    Past Surgical History:  Procedure Laterality Date   ANKLE FUSION Left 04/27/2013   Procedure: ANKLE FUSION- left;  Surgeon: Newt Minion, MD;  Location: [facility];  Service: Orthopedics;  Laterality: Left;  Left Subtalar Fusion and Talonavicular Fusion   BOTOX INJECTION  06/04/2021   Procedure: BOTOX INJECTION;  Surgeon: Clarene Essex, MD;  [facility]: [facility];  Service: Endoscopy;;   ESOPHAGEAL MANOMETRY N/A 03/05/2015   Procedure: ESOPHAGEAL MANOMETRY (EM);  Surgeon: Clarene Essex, MD;  [facility]: [facility];  Service: Endoscopy;  Laterality: N/A;   ESOPHAGEAL MANOMETRY Bilateral 01/08/2022   Procedure: ESOPHAGEAL MANOMETRY (EM);  Surgeon: Clarene Essex, MD;  [facility];  Service: Gastroenterology;  Laterality: Bilateral;   ESOPHAGOGASTRODUODENOSCOPY (EGD) WITH PROPOFOL N/A 06/04/2021   Procedure: ESOPHAGOGASTRODUODENOSCOPY (EGD) WITH PROPOFOL;  Surgeon: Clarene Essex, MD;  [facility];  Service: Endoscopy;  Laterality: N/A;   EYE SURGERY  ? early 2000's   bilateral cataracts   LAPAROSCOPIC LYSIS OF ADHESIONS  05/03/2015   Procedure: LAPAROSCOPIC LYSIS OF ADHESIONS;  Surgeon: Michael Boston, MD;  Location: [facility];  Service: General;;   lymphoma excision Left 1996   Patient Active Problem List   Diagnosis Date Noted   Lumbar spine instability 06/21/2020   Achalasia s/p Heller myotomy/Toupet 05/03/2015 05/03/2015   Diabetes mellitus without complication ([facility])    Arthritis     REFERRING DIAG:  LT S1 Radicular pain with MRI showing  L5-S1 Stenosis     ONSET DATE: 07/28/2022  THERAPY DIAG:  Other low back pain  Muscle weakness (generalized)  Cramp and spasm  Rationale for Evaluation and Treatment Rehabilitation  PERTINENT HISTORY:    FALLS:  Has patient fallen in last 6 months? Yes. Number of falls 2 (tripped over Christmas tree when decorating and fell in the bathroom)  PRECAUTIONS: falls  Living Environment:  Lives with: lives with their family Lives in: House/apartment Stairs: Yes: Internal: 12 steps; on left going up and External: 2 steps; on left going up Has following equipment at home: None   PATIENT GOALS: To be able to walk pain free, be able to bend down and go back to the gym and work out safely.     NEXT MD VISIT: prn  SUBJECTIVE:  SUBJECTIVE STATEMENT:  My pain is pretty low today.  I took my meds and that always helps.  I am hoping to get back into gym next week and wonder what I should/shouldn't do.   PAIN:  Are you having pain? Yes: NPRS scale: 1-2/10 Pain location: low back and left hip and leg Pain description: aching Aggravating factors: housework, bending, stooping and squatting Relieving factors: meds (gabapentin and  ibuprofen)   OBJECTIVE: (objective measures completed at initial evaluation unless otherwise dated)   DIAGNOSTIC FINDINGS:  MRI showing  L5-S1 Stenosis   PATIENT SURVEYS:  FOTO 41 predicted 55   SCREENING FOR RED FLAGS: Bowel or bladder incontinence: No Spinal tumors: No Cauda equina syndrome: No Compression fracture: No Abdominal aneurysm: No   COGNITION: Overall cognitive status: Within functional limits for tasks assessed                          SENSATION: Intermittant radicular symptoms left LE along L5-S1   MUSCLE LENGTH: Hamstrings: Right 60 deg; Left 60 deg Thomas test: Right pos ; Left pos    POSTURE: rounded shoulders, forward head, and decreased lumbar lordosis   PALPATION: Pain located left S.I. area   LUMBAR ROM:    AROM eval  Flexion Fingertips to distal shin  Extension 25% with deviation to left  Right lateral flexion To joint line  Left lateral flexion To joint line  Right rotation 50%  Left rotation 50%   (Blank rows = not tested)   LOWER EXTREMITY ROM:      WFL   LOWER EXTREMITY MMT:     Generally 4 to 4+/5 with exception of bil hip extension and abduction 4-/5   LUMBAR SPECIAL TESTS:  Straight leg raise test: Positive and Slump test: Negative   FUNCTIONAL TESTS:  5 times sit to stand: 20.30 sec Timed up and go (TUG): 13.25 sec   GAIT: Distance walked: 40 Assistive device utilized: None Level of assistance: Complete Independence Comments: antalgic   TODAY'S TREATMENT:                                                                                                                              DATE: 07/31/22 NuStep L5 x 4' PT present to discuss plan and gym options Supine hamstring stretch hooklying 1x20" bil Hip flexor stretch off side of bed 1x20" Supine bridge x10 Pelvic tilts x 10 Hip flexion isometric 5x5" Standing 5lb bil UE 2-way raise flexion and scaption x5 each Standing 3lb diagonal raise x 5 each way Seated hip abd  red loop band x 20 Sit to stand from chair with red loop hip abd x 10 Seated reverse crunch holding 5lb kbell at chest x 10 Hip to hip and shoulder to shoulder taps 5lb kbell x 20 each Seated deadlift 5lb x15  DATE: 07/29/22 Initiated HEP and initial eval completed      PATIENT EDUCATION:  Education details:  Initiated HEP Person educated: Patient Education method: Education officer, environmental, Verbal cues, and Handouts Education comprehension: verbalized understanding, returned demonstration, and verbal cues required   HOME EXERCISE PROGRAM: Access Code: [email or web address] URL: [email or web address] Date: 07/31/2022 Prepared by: Venetia Night Wrenly Lauritsen  Exercises - Hip Flexor Stretch at Edge of Bed  - 1 x daily - 7 x weekly - 1 sets - 3 reps - 20 sec hold - Supine Hamstring Stretch  - 1 x daily - 7 x weekly - 1 sets - 3 reps - 20 sec hold   ASSESSMENT:   CLINICAL IMPRESSION: Pt did well during first time follow up visit, advancing core stabilization and functional strength with min pain.  She plans to go to gym next week so PT discussed options for her on cardio equipment, UE machines, and LE machines targeting abductors and adductors.  PT closely monitored therex tolerance for bil knees which are at end stage OA with valgus deformities.     OBJECTIVE IMPAIRMENTS: decreased knowledge of condition, decreased mobility, difficulty walking, decreased ROM, decreased strength, increased fascial restrictions, increased muscle spasms, impaired flexibility, improper body mechanics, postural dysfunction, and pain.    ACTIVITY LIMITATIONS: carrying, lifting, bending, sitting, standing, squatting, sleeping, stairs, transfers, bathing, and dressing   PARTICIPATION LIMITATIONS: meal prep, cleaning, laundry, driving, shopping, community activity, and yard work   PERSONAL FACTORS: Fitness, Past/current experiences, Time since onset of injury/illness/exacerbation, and 1-2 comorbidities: bronchitis,  diabetes  are also affecting patient's functional outcome.    REHAB POTENTIAL: Fair Hx of L4-L5 fusion, but did exercise regularly previously, pain has been present for about 3 months   CLINICAL DECISION MAKING: Stable/uncomplicated   EVALUATION COMPLEXITY: Low     GOALS: Goals reviewed with patient? Yes   SHORT TERM GOALS: Target date: 08/26/2022     Pain report to be no greater than 4/10  Baseline: Goal status: INITIAL   2.  Patient will be independent with initial HEP  Baseline:  Goal status: INITIAL     LONG TERM GOALS: Target date: 09/23/2022     Patient to be independent with advanced HEP  Baseline:  Goal status: INITIAL   2.  Patient to report pain no greater than 2/10  Baseline:  Goal status: INITIAL   3.  FOTO to improve to predicted score Baseline:  Goal status: INITIAL   4.  Patient to be able to resume gym activities Baseline:  Goal status: INITIAL   5.  Functional scores to improve by 2-3 seconds Baseline:  Goal status: INITIAL   6.  Patient to report 85% improvement in overall symptoms  Baseline:  Goal status: INITIAL   PLAN:   PT FREQUENCY: 1-2x/week   PT DURATION: 8 weeks   PLANNED INTERVENTIONS: Therapeutic exercises, Therapeutic activity, Neuromuscular re-education, Balance training, Gait training, Patient/Family education, Self Care, Joint mobilization, Stair training, DME instructions, Aquatic Therapy, Dry Needling, Electrical stimulation, Spinal mobilization, Cryotherapy, Moist heat, Splintting, Taping, Traction, Ultrasound, Ionotophoresis '4mg'$ /ml Dexamethasone, Manual therapy, and Re-evaluation.   PLAN FOR NEXT SESSION: How was gym, advance core and functional strength, hip abd and ER strength for knee support, update HEP      Tayten Heber, PT 07/31/22 1:19 PM

## 2022-08-13 ENCOUNTER — Ambulatory Visit: Payer: Medicare Other | Attending: Physical Medicine and Rehabilitation | Admitting: Physical Therapy

## 2022-08-13 ENCOUNTER — Encounter: Payer: Self-pay | Admitting: Physical Therapy

## 2022-08-13 DIAGNOSIS — R252 Cramp and spasm: Secondary | ICD-10-CM

## 2022-08-13 DIAGNOSIS — M5459 Other low back pain: Secondary | ICD-10-CM

## 2022-08-13 DIAGNOSIS — R262 Difficulty in walking, not elsewhere classified: Secondary | ICD-10-CM | POA: Diagnosis present

## 2022-08-13 DIAGNOSIS — M6281 Muscle weakness (generalized): Secondary | ICD-10-CM | POA: Diagnosis present

## 2022-08-13 NOTE — Therapy (Signed)
OUTPATIENT PHYSICAL THERAPY TREATMENT NOTE   Patient Name: COURTNAY PETRILLA MRN: [id number] DOB:1956/02/23, 67 y.o., female Today's Date: 08/13/2022  PCP: Lujean Amel, MD REFERRING PROVIDER: Laroy Apple, MD  END OF SESSION:   PT End of Session - 08/13/22 1235     Visit Number 3    Date for PT Re-Evaluation 09/23/22    Authorization Type MEDICARE PART A AND B    Progress Note Due on Visit 10    PT Start Time 1232    PT Stop Time 3329    PT Time Calculation (min) 45 min    Activity Tolerance Patient tolerated treatment well    Behavior During Therapy WFL for tasks assessed/performed              Past Medical History:  Diagnosis Date   Arthritis    Bronchitis    Diabetes mellitus without complication ([place])    Family history of adverse reaction to anesthesia    N/V    GERD (gastroesophageal reflux disease)    H/O stem cell transplant ([place]) 1997   History of biopsy 1997   left side neck   Non Hodgkin's lymphoma ([place]) 1997   PONV (postoperative nausea and vomiting)    Past Surgical History:  Procedure Laterality Date   ANKLE FUSION Left 04/27/2013   Procedure: ANKLE FUSION- left;  Surgeon: Newt Minion, MD;  Location: [facility];  Service: Orthopedics;  Laterality: Left;  Left Subtalar Fusion and Talonavicular Fusion   BOTOX INJECTION  06/04/2021   Procedure: BOTOX INJECTION;  Surgeon: Clarene Essex, MD;  [facility];  Service: Endoscopy;;   ESOPHAGEAL MANOMETRY N/A 03/05/2015   Procedure: ESOPHAGEAL MANOMETRY (EM);  Surgeon: Clarene Essex, MD;  [facility];  Service: Endoscopy;  Laterality: N/A;   ESOPHAGEAL MANOMETRY Bilateral 01/08/2022   Procedure: ESOPHAGEAL MANOMETRY (EM);  Surgeon: Clarene Essex, MD;  [facility];  Service: Gastroenterology;  Laterality: Bilateral;   ESOPHAGOGASTRODUODENOSCOPY (EGD) WITH PROPOFOL N/A 06/04/2021   Procedure: ESOPHAGOGASTRODUODENOSCOPY (EGD) WITH PROPOFOL;  Surgeon: Clarene Essex, MD;  [facility];  Service: Endoscopy;  Laterality: N/A;   EYE SURGERY  ? early 2000's   bilateral cataracts   LAPAROSCOPIC LYSIS OF ADHESIONS  05/03/2015   Procedure: LAPAROSCOPIC LYSIS OF ADHESIONS;  Surgeon: Michael Boston, MD;  [facility];  Service: General;;   lymphoma excision Left 1996   Patient Active Problem List   Diagnosis Date Noted   Lumbar spine instability 06/21/2020   Achalasia s/p Heller myotomy/Toupet 05/03/2015 05/03/2015   Diabetes mellitus without complication ([facility])    Arthritis     REFERRING DIAG:  LT S1 Radicular pain with MRI showing  L5-S1 Stenosis     ONSET DATE: 07/28/2022  THERAPY DIAG:  Other low back pain  Muscle weakness (generalized)  Cramp and spasm  Rationale for Evaluation and Treatment Rehabilitation  PERTINENT HISTORY:    FALLS:  Has patient fallen in last 6 months? Yes. Number of falls 2 (tripped over Christmas tree when decorating and fell in the bathroom)  PRECAUTIONS: falls  Living Environment:  Lives with: lives with their family Lives in: House/apartment Stairs: Yes: Internal: 12 steps; on left going up and External: 2 steps; on left going up Has following equipment at home: None   PATIENT GOALS: To be able to walk pain free, be able to bend down and go back to the gym and work out safely.     NEXT MD VISIT: prn  SUBJECTIVE:  SUBJECTIVE STATEMENT:  I had a good day yesterday.  Pain hasn't been too bad.  The radiating pain down my leg hasn't been as bad.  I didn't get to the gym unfortunately.     PAIN:  Are you having pain? Yes: NPRS scale: 2/10 Pain location: low back and left hip and leg Pain description: aching Aggravating factors: housework, bending, stooping and squatting Relieving factors: meds (gabapentin and ibuprofen)   OBJECTIVE:  (objective measures completed at initial evaluation unless otherwise dated)   DIAGNOSTIC FINDINGS:  MRI showing  L5-S1 Stenosis   PATIENT SURVEYS:  FOTO 41 predicted 55   SCREENING FOR RED FLAGS: Bowel or bladder incontinence: No Spinal tumors: No Cauda equina syndrome: No Compression fracture: No Abdominal aneurysm: No   COGNITION: Overall cognitive status: Within functional limits for tasks assessed                          SENSATION: Intermittant radicular symptoms left LE along L5-S1   MUSCLE LENGTH: Hamstrings: Right 60 deg; Left 60 deg Thomas test: Right pos ; Left pos    POSTURE: rounded shoulders, forward head, and decreased lumbar lordosis   PALPATION: Pain located left S.I. area   LUMBAR ROM:    AROM eval  Flexion Fingertips to distal shin  Extension 25% with deviation to left  Right lateral flexion To joint line  Left lateral flexion To joint line  Right rotation 50%  Left rotation 50%   (Blank rows = not tested)   LOWER EXTREMITY ROM:      WFL   LOWER EXTREMITY MMT:     Generally 4 to 4+/5 with exception of bil hip extension and abduction 4-/5   LUMBAR SPECIAL TESTS:  Straight leg raise test: Positive and Slump test: Negative   FUNCTIONAL TESTS:  5 times sit to stand: 20.30 sec Timed up and go (TUG): 13.25 sec   GAIT: Distance walked: 40 Assistive device utilized: None Level of assistance: Complete Independence Comments: antalgic   TODAY'S TREATMENT:   DATE: 08/13/22       NuStep L5 x 5' PT present to discuss progress Supine lower trunk rotation x 20 Supine hip abd red loop x10 Supine bridge with hip abd red loop x 10 Sit to stand mat table + pad with ball b/w knees x 10 Standing 5lb kbell deadlift x 8 reps Standing row 10lb x 10 reps Backward cable pulley walking 5lb x 8 reps Seated reverse crunch holding 5lb kbell at chest x 10 Hip to hip and shoulder to shoulder taps 5lb kbell x 20 each Wall plank 3x20" with VC for proper  activation of TA Issued HEP                                                                                                         DATE: 07/31/22 NuStep L5 x 4' PT present to discuss plan and gym options Supine hamstring stretch hooklying 1x20" bil Hip flexor stretch off side of bed 1x20" Supine bridge x10 Pelvic tilts x 10  Hip flexion isometric 5x5" Standing 5lb bil UE 2-way raise flexion and scaption x5 each Standing 3lb diagonal raise x 5 each way Seated hip abd red loop band x 20 Sit to stand from chair with red loop hip abd x 10 Seated reverse crunch holding 5lb kbell at chest x 10 Hip to hip and shoulder to shoulder taps 5lb kbell x 20 each Seated deadlift 5lb x15  DATE: 07/29/22 Initiated HEP and initial eval completed      PATIENT EDUCATION:  Education details: Initiated HEP Person educated: Patient Education method: Consulting civil engineer, Media planner, Verbal cues, and Handouts Education comprehension: verbalized understanding, returned demonstration, and verbal cues required   HOME EXERCISE PROGRAM: Access Code: [email or web address] URL: [email or web address] Date: 08/13/2022 Prepared by: Venetia Night Videl Nobrega  Exercises - Hip Flexor Stretch at Edge of Bed  - 1 x daily - 7 x weekly - 1 sets - 3 reps - 20 sec hold - Supine Hamstring Stretch  - 1 x daily - 7 x weekly - 1 sets - 3 reps - 20 sec hold - Hooklying Clamshell with Resistance  - 1 x daily - 7 x weekly - 1 sets - 10 reps - Bridge with Hip Abduction and Resistance  - 1 x daily - 7 x weekly - 1 sets - 10 reps - Modified Eccentric Sit Up in Backless Chair  - 1 x daily - 7 x weekly - 1 sets - 10 reps - Standing Plank on Wall  - 1 x daily - 7 x weekly - 1 sets - 3 reps - 20 hold - Standing Row with Anchored Resistance  - 1 x daily - 7 x weekly - 1 sets - 10 reps   ASSESSMENT:   CLINICAL IMPRESSION: Pt arrived with report of reduced intensity of LBP and radiating leg pain.  She had not made it to gym yet.  She was able  to progress seated deadlift with standing deadlift although needed supervision for balance today with single brief LOB.  She is demonstrating improved core activation.  Issued HEP for home with encouragement to get to gym 1-2 more times before next appointment.  PT continues to closely monitor knee valgus deformities and symptoms within therex to address lumbar pain and weakness.   OBJECTIVE IMPAIRMENTS: decreased knowledge of condition, decreased mobility, difficulty walking, decreased ROM, decreased strength, increased fascial restrictions, increased muscle spasms, impaired flexibility, improper body mechanics, postural dysfunction, and pain.    ACTIVITY LIMITATIONS: carrying, lifting, bending, sitting, standing, squatting, sleeping, stairs, transfers, bathing, and dressing   PARTICIPATION LIMITATIONS: meal prep, cleaning, laundry, driving, shopping, community activity, and yard work   PERSONAL FACTORS: Fitness, Past/current experiences, Time since onset of injury/illness/exacerbation, and 1-2 comorbidities: bronchitis, diabetes  are also affecting patient's functional outcome.    REHAB POTENTIAL: Fair Hx of L4-L5 fusion, but did exercise regularly previously, pain has been present for about 3 months   CLINICAL DECISION MAKING: Stable/uncomplicated   EVALUATION COMPLEXITY: Low     GOALS: Goals reviewed with patient? Yes   SHORT TERM GOALS: Target date: 08/26/2022     Pain report to be no greater than 4/10  Baseline: Goal status: met   2.  Patient will be independent with initial HEP  Baseline:  Goal status: ongoing     LONG TERM GOALS: Target date: 09/23/2022     Patient to be independent with advanced HEP  Baseline:  Goal status: INITIAL   2.  Patient to report pain no greater than 2/10  Baseline:  Goal status:  INITIAL   3.  FOTO to improve to predicted score Baseline:  Goal status: INITIAL   4.  Patient to be able to resume gym activities Baseline:  Goal status:  INITIAL   5.  Functional scores to improve by 2-3 seconds Baseline:  Goal status: INITIAL   6.  Patient to report 85% improvement in overall symptoms  Baseline:  Goal status: INITIAL   PLAN:   PT FREQUENCY: 1-2x/week   PT DURATION: 8 weeks   PLANNED INTERVENTIONS: Therapeutic exercises, Therapeutic activity, Neuromuscular re-education, Balance training, Gait training, Patient/Family education, Self Care, Joint mobilization, Stair training, DME instructions, Aquatic Therapy, Dry Needling, Electrical stimulation, Spinal mobilization, Cryotherapy, Moist heat, Splintting, Taping, Traction, Ultrasound, Ionotophoresis 77m/ml Dexamethasone, Manual therapy, and Re-evaluation.   PLAN FOR NEXT SESSION: How was gym, advance core and functional strength, hip abd and ER strength for knee support, review and progress HEP as needed      Gracilyn Gunia, PT 08/13/22 1:23 PM

## 2022-08-19 ENCOUNTER — Encounter: Payer: Self-pay | Admitting: Physical Therapy

## 2022-08-19 ENCOUNTER — Ambulatory Visit: Payer: Medicare Other | Admitting: Physical Therapy

## 2022-08-19 DIAGNOSIS — M6281 Muscle weakness (generalized): Secondary | ICD-10-CM

## 2022-08-19 DIAGNOSIS — M5459 Other low back pain: Secondary | ICD-10-CM

## 2022-08-19 DIAGNOSIS — R252 Cramp and spasm: Secondary | ICD-10-CM

## 2022-08-19 NOTE — Therapy (Signed)
OUTPATIENT PHYSICAL THERAPY TREATMENT NOTE   Patient Name: Melinda Drake MRN: [id number] DOB:[birth year], 67 y.o., female Today's Date: 08/19/2022  PCP: Lujean Amel, MD REFERRING PROVIDER: Laroy Apple, MD  END OF SESSION:   PT End of Session - 08/19/22 1151     Visit Number 4    Date for PT Re-Evaluation 09/23/22    Authorization Type MEDICARE PART A AND B    Progress Note Due on Visit 10    PT Start Time 1147    PT Stop Time 1228    PT Time Calculation (min) 41 min    Activity Tolerance Patient tolerated treatment well    Behavior During Therapy WFL for tasks assessed/performed               Past Medical History:  Diagnosis Date   Arthritis    Bronchitis    Diabetes mellitus without complication ([place])    Family history of adverse reaction to anesthesia    N/V    GERD (gastroesophageal reflux disease)    H/O stem cell transplant ([place]) 1997   History of biopsy 1997   left side neck   Non Hodgkin's lymphoma ([place]) 1997   PONV (postoperative nausea and vomiting)    Past Surgical History:  Procedure Laterality Date   ANKLE FUSION Left 04/27/2013   Procedure: ANKLE FUSION- left;  Surgeon: Newt Minion, MD;  Location: [place];  Service: Orthopedics;  Laterality: Left;  Left Subtalar Fusion and Talonavicular Fusion   BOTOX INJECTION  06/04/2021   Procedure: BOTOX INJECTION;  Surgeon: Clarene Essex, MD;  [facility];  Service: Endoscopy;;   ESOPHAGEAL MANOMETRY N/A 03/05/2015   Procedure: ESOPHAGEAL MANOMETRY (EM);  Surgeon: Clarene Essex, MD;  [facility];  Service: Endoscopy;  Laterality: N/A;   ESOPHAGEAL MANOMETRY Bilateral 01/08/2022   Procedure: ESOPHAGEAL MANOMETRY (EM);  Surgeon: Clarene Essex, MD;  [facility];  Service: Gastroenterology;  Laterality: Bilateral;   ESOPHAGOGASTRODUODENOSCOPY (EGD) WITH PROPOFOL N/A 06/04/2021   Procedure: ESOPHAGOGASTRODUODENOSCOPY (EGD) WITH PROPOFOL;  Surgeon: Clarene Essex, MD;  [facility];  Service: Endoscopy;  Laterality: N/A;   EYE SURGERY  ? early 2000's   bilateral cataracts   LAPAROSCOPIC LYSIS OF ADHESIONS  05/03/2015   Procedure: LAPAROSCOPIC LYSIS OF ADHESIONS;  Surgeon: Michael Boston, MD;  [facility]: [facility];  Service: General;;   lymphoma excision Left 1996   Patient Active Problem List   Diagnosis Date Noted   Lumbar spine instability 06/21/2020   Achalasia s/p Heller myotomy/Toupet 05/03/2015 05/03/2015   Diabetes mellitus without complication ([facility])    Arthritis     REFERRING DIAG:  LT S1 Radicular pain with MRI showing  L5-S1 Stenosis     ONSET DATE: 07/28/2022  THERAPY DIAG:  Other low back pain  Muscle weakness (generalized)  Cramp and spasm  Rationale for Evaluation and Treatment Rehabilitation  PERTINENT HISTORY:    FALLS:  Has patient fallen in last 6 months? Yes. Number of falls 2 (tripped over Christmas tree when decorating and fell in the bathroom)  PRECAUTIONS: falls  Living Environment:  Lives with: lives with their family Lives in: House/apartment Stairs: Yes: Internal: 12 steps; on left going up and External: 2 steps; on left going up Has following equipment at home: None   PATIENT GOALS: To be able to walk pain free, be able to bend down and go back to the gym and work out safely.     NEXT MD VISIT: prn  SUBJECTIVE:  SUBJECTIVE STATEMENT:  I woke up with pain last night and am still pretty bad.  Along Lt hip and leg.  I did make it to gym once since here and it went well.    PAIN:  Are you having pain? Yes: NPRS scale: 5/10 Pain location: low back and left hip and leg Pain description: aching Aggravating factors: housework, bending, stooping and squatting Relieving factors: meds (gabapentin and ibuprofen)   OBJECTIVE: (objective  measures completed at initial evaluation unless otherwise dated)   DIAGNOSTIC FINDINGS:  MRI showing  L5-S1 Stenosis   PATIENT SURVEYS:  FOTO 41 predicted 55   SCREENING FOR RED FLAGS: Bowel or bladder incontinence: No Spinal tumors: No Cauda equina syndrome: No Compression fracture: No Abdominal aneurysm: No   COGNITION: Overall cognitive status: Within functional limits for tasks assessed                          SENSATION: Intermittant radicular symptoms left LE along L5-S1   MUSCLE LENGTH: Hamstrings: Right 60 deg; Left 60 deg Thomas test: Right pos ; Left pos    POSTURE: rounded shoulders, forward head, and decreased lumbar lordosis   PALPATION: Pain located left S.I. area   LUMBAR ROM:    AROM eval  Flexion Fingertips to distal shin  Extension 25% with deviation to left  Right lateral flexion To joint line  Left lateral flexion To joint line  Right rotation 50%  Left rotation 50%   (Blank rows = not tested)   LOWER EXTREMITY ROM:      WFL   LOWER EXTREMITY MMT:     Generally 4 to 4+/5 with exception of bil hip extension and abduction 4-/5   LUMBAR SPECIAL TESTS:  Straight leg raise test: Positive and Slump test: Negative   FUNCTIONAL TESTS:  5 times sit to stand: 20.30 sec Timed up and go (TUG): 13.25 sec   GAIT: Distance walked: 40 Assistive device utilized: None Level of assistance: Complete Independence Comments: antalgic   TODAY'S TREATMENT:  DATE: 08/19/22    NuStep L2 x 5' PT present to discuss status Addaday assisted STM Lt hip, buttock, post thigh Supine feet on ball roll in/out x 15 Supine hip abd red loop x10 Supine bridge with hip abd red loop x 10 Supine 3lb weighted ball diagonals x 10 each way with legs in table top on red physioball Supine hamstring stretch 2x20" bil Seated deadlift 5lb kbell x 10 Hip to hip and shoulder to shoulder taps 5lb kbell x 20 each Lateral step up 2" Lt LE x 10 bil UE support Bil leg press 2x20  60lb  DATE: 08/13/22       NuStep L5 x 5' PT present to discuss progress Supine lower trunk rotation x 20 Supine hip abd red loop x10 Supine bridge with hip abd red loop x 10 Sit to stand mat table + pad with ball b/w knees x 10 Standing 5lb kbell deadlift x 8 reps Standing row 10lb x 10 reps Backward cable pulley walking 5lb x 8 reps Seated reverse crunch holding 5lb kbell at chest x 10 Hip to hip and shoulder to shoulder taps 5lb kbell x 20 each Wall plank 3x20" with VC for proper activation of TA Issued HEP  DATE: 07/31/22 NuStep L5 x 4' PT present to discuss plan and gym options Supine hamstring stretch hooklying 1x20" bil Hip flexor stretch off side of bed 1x20" Supine bridge x10 Pelvic tilts x 10 Hip flexion isometric 5x5" Standing 5lb bil UE 2-way raise flexion and scaption x5 each Standing 3lb diagonal raise x 5 each way Seated hip abd red loop band x 20 Sit to stand from chair with red loop hip abd x 10 Seated reverse crunch holding 5lb kbell at chest x 10 Hip to hip and shoulder to shoulder taps 5lb kbell x 20 each Seated deadlift 5lb x15     PATIENT EDUCATION:  Education details: Initiated HEP Person educated: Patient Education method: Consulting civil engineer, Media planner, Verbal cues, and Handouts Education comprehension: verbalized understanding, returned demonstration, and verbal cues required   HOME EXERCISE PROGRAM: Access Code: [email or web address] URL: [email or web address] Date: 08/13/2022 Prepared by: Melinda Drake  Exercises - Hip Flexor Stretch at Edge of Bed  - 1 x daily - 7 x weekly - 1 sets - 3 reps - 20 sec hold - Supine Hamstring Stretch  - 1 x daily - 7 x weekly - 1 sets - 3 reps - 20 sec hold - Hooklying Clamshell with Resistance  - 1 x daily - 7 x weekly - 1 sets - 10 reps - Bridge with Hip Abduction and Resistance  - 1 x daily - 7 x weekly -  1 sets - 10 reps - Modified Eccentric Sit Up in Backless Chair  - 1 x daily - 7 x weekly - 1 sets - 10 reps - Standing Plank on Wall  - 1 x daily - 7 x weekly - 1 sets - 3 reps - 20 hold - Standing Row with Anchored Resistance  - 1 x daily - 7 x weekly - 1 sets - 10 reps   ASSESSMENT:   CLINICAL IMPRESSION: Pt went to gym last week and did well with NuStep, ellipical and strength machines with light resistance.  She did report a flare up of pain today which she woke up with in the night.  She continues to be limited in standing tolerance.  Pain was 5/10 concentrated in Lt buttock and posterior thigh.  She is improving with her core activations with overlay of UE movement.  She was able to introduce bil leg press with light resistance with good tolerance.  PT added Addaday assisted STM to Lt hip and posterior thigh with good relief.  Pain reduced to 4/10 end of session.   OBJECTIVE IMPAIRMENTS: decreased knowledge of condition, decreased mobility, difficulty walking, decreased ROM, decreased strength, increased fascial restrictions, increased muscle spasms, impaired flexibility, improper body mechanics, postural dysfunction, and pain.    ACTIVITY LIMITATIONS: carrying, lifting, bending, sitting, standing, squatting, sleeping, stairs, transfers, bathing, and dressing   PARTICIPATION LIMITATIONS: meal prep, cleaning, laundry, driving, shopping, community activity, and yard work   PERSONAL FACTORS: Fitness, Past/current experiences, Time since onset of injury/illness/exacerbation, and 1-2 comorbidities: bronchitis, diabetes  are also affecting patient's functional outcome.    REHAB POTENTIAL: Fair Hx of L4-L5 fusion, but did exercise regularly previously, pain has been present for about 3 months   CLINICAL DECISION MAKING: Stable/uncomplicated   EVALUATION COMPLEXITY: Low     GOALS: Goals reviewed with patient? Yes   SHORT TERM GOALS: Target date: 08/26/2022     Pain report to be no greater  than 4/10  Baseline: Goal status: met   2.  Patient will be independent with initial HEP  Baseline:  Goal status: ongoing     LONG TERM GOALS: Target date: 09/23/2022     Patient to be independent with advanced HEP  Baseline:  Goal status: INITIAL   2.  Patient to report pain no greater than 2/10  Baseline:  Goal status: INITIAL   3.  FOTO to improve to predicted score Baseline:  Goal status: INITIAL   4.  Patient to be able to resume gym activities Baseline:  Goal status: INITIAL   5.  Functional scores to improve by 2-3 seconds Baseline:  Goal status: INITIAL   6.  Patient to report 85% improvement in overall symptoms  Baseline:  Goal status: INITIAL   PLAN:   PT FREQUENCY: 1-2x/week   PT DURATION: 8 weeks   PLANNED INTERVENTIONS: Therapeutic exercises, Therapeutic activity, Neuromuscular re-education, Balance training, Gait training, Patient/Family education, Self Care, Joint mobilization, Stair training, DME instructions, Aquatic Therapy, Dry Needling, Electrical stimulation, Spinal mobilization, Cryotherapy, Moist heat, Splintting, Taping, Traction, Ultrasound, Ionotophoresis '4mg'$ /ml Dexamethasone, Manual therapy, and Re-evaluation.   PLAN FOR NEXT SESSION: How is Lt hip pain, Addaday assisted STM Lt hip, HS stretching, advance core and functional strength, hip abd and ER strength for knee support, review and progress HEP as needed     Kaye Luoma, PT 08/19/22 12:28 PM

## 2022-08-21 ENCOUNTER — Ambulatory Visit: Payer: Medicare Other

## 2022-08-21 DIAGNOSIS — M6281 Muscle weakness (generalized): Secondary | ICD-10-CM

## 2022-08-21 DIAGNOSIS — M5459 Other low back pain: Secondary | ICD-10-CM

## 2022-08-21 DIAGNOSIS — R252 Cramp and spasm: Secondary | ICD-10-CM

## 2022-08-21 DIAGNOSIS — R262 Difficulty in walking, not elsewhere classified: Secondary | ICD-10-CM

## 2022-08-21 NOTE — Therapy (Signed)
OUTPATIENT PHYSICAL THERAPY TREATMENT NOTE   Patient Name: Melinda Drake MRN: [id number] DOB:[birth year], 67 y.o., female Today's Date: 08/21/2022  PCP: Lujean Amel, MD REFERRING PROVIDER: Laroy Apple, MD  END OF SESSION:   PT End of Session - 08/21/22 1514     Visit Number 5    Date for PT Re-Evaluation 09/23/22    Authorization Type MEDICARE PART A AND B    Progress Note Due on Visit 10    PT Start Time 1448    PT Stop Time 1528    PT Time Calculation (min) 40 min    Activity Tolerance Patient tolerated treatment well    Behavior During Therapy WFL for tasks assessed/performed                Past Medical History:  Diagnosis Date   Arthritis    Bronchitis    Diabetes mellitus without complication ([place])    Family history of adverse reaction to anesthesia    N/V    GERD (gastroesophageal reflux disease)    H/O stem cell transplant ([place]) 1997   History of biopsy 1997   left side neck   Non Hodgkin's lymphoma ([place]) 1997   PONV (postoperative nausea and vomiting)    Past Surgical History:  Procedure Laterality Date   ANKLE FUSION Left 04/27/2013   Procedure: ANKLE FUSION- left;  Surgeon: Newt Minion, MD;  Location: [place];  Service: Orthopedics;  Laterality: Left;  Left Subtalar Fusion and Talonavicular Fusion   BOTOX INJECTION  06/04/2021   Procedure: BOTOX INJECTION;  Surgeon: Clarene Essex, MD;  [facility];  Service: Endoscopy;;   ESOPHAGEAL MANOMETRY N/A 03/05/2015   Procedure: ESOPHAGEAL MANOMETRY (EM);  Surgeon: Clarene Essex, MD;  [facility];  Service: Endoscopy;  Laterality: N/A;   ESOPHAGEAL MANOMETRY Bilateral 01/08/2022   Procedure: ESOPHAGEAL MANOMETRY (EM);  Surgeon: Clarene Essex, MD;  [facility];  Service: Gastroenterology;  Laterality: Bilateral;   ESOPHAGOGASTRODUODENOSCOPY (EGD) WITH PROPOFOL N/A 06/04/2021   Procedure: ESOPHAGOGASTRODUODENOSCOPY (EGD) WITH PROPOFOL;  Surgeon: Clarene Essex, MD;  [facility];  Service: Endoscopy;  Laterality: N/A;   EYE SURGERY  ? early 2000's   bilateral cataracts   LAPAROSCOPIC LYSIS OF ADHESIONS  05/03/2015   Procedure: LAPAROSCOPIC LYSIS OF ADHESIONS;  Surgeon: Michael Boston, MD;  [facility];  Service: General;;   lymphoma excision Left 1996   Patient Active Problem List   Diagnosis Date Noted   Lumbar spine instability 06/21/2020   Achalasia s/p Heller myotomy/Toupet 05/03/2015 05/03/2015   Diabetes mellitus without complication ([facility])    Arthritis     REFERRING DIAG:  LT S1 Radicular pain with MRI showing  L5-S1 Stenosis     ONSET DATE: 07/28/2022  THERAPY DIAG:  Other low back pain  Muscle weakness (generalized)  Cramp and spasm  Difficulty in walking, not elsewhere classified  Rationale for Evaluation and Treatment Rehabilitation  PERTINENT HISTORY:    FALLS:  Has patient fallen in last 6 months? Yes. Number of falls 2 (tripped over Christmas tree when decorating and fell in the bathroom)  PRECAUTIONS: falls  Living Environment:  Lives with: lives with their family Lives in: House/apartment Stairs: Yes: Internal: 12 steps; on left going up and External: 2 steps; on left going up Has following equipment at home: None   PATIENT GOALS: To be able to walk pain free, be able to bend down and go back to the gym and work out safely.     NEXT  MD VISIT: prn  SUBJECTIVE:                                                                                                                                                                                      SUBJECTIVE STATEMENT:  Pt states that she was having a lot of pain yesterday, about a 7/10. Today she is feeling better. She feels like the Addaday may be making things worse and does not want to try today.    PAIN:  Are you having pain? Yes: NPRS scale: 4/10 Pain location: low back and left hip and leg Pain description: aching Aggravating factors: housework, bending,  stooping and squatting Relieving factors: meds (gabapentin and ibuprofen)   OBJECTIVE: (objective measures completed at initial evaluation unless otherwise dated)   DIAGNOSTIC FINDINGS:  MRI showing  L5-S1 Stenosis   PATIENT SURVEYS:  FOTO 41 predicted 55   SCREENING FOR RED FLAGS: Bowel or bladder incontinence: No Spinal tumors: No Cauda equina syndrome: No Compression fracture: No Abdominal aneurysm: No   COGNITION: Overall cognitive status: Within functional limits for tasks assessed                          SENSATION: Intermittant radicular symptoms left LE along L5-S1   MUSCLE LENGTH: Hamstrings: Right 60 deg; Left 60 deg Thomas test: Right pos ; Left pos    POSTURE: rounded shoulders, forward head, and decreased lumbar lordosis   PALPATION: Pain located left S.I. area   LUMBAR ROM:    AROM eval  Flexion Fingertips to distal shin  Extension 25% with deviation to left  Right lateral flexion To joint line  Left lateral flexion To joint line  Right rotation 50%  Left rotation 50%   (Blank rows = not tested)   LOWER EXTREMITY ROM:      WFL   LOWER EXTREMITY MMT:     Generally 4 to 4+/5 with exception of bil hip extension and abduction 4-/5   LUMBAR SPECIAL TESTS:  Straight leg raise test: Positive and Slump test: Negative   FUNCTIONAL TESTS:  5 times sit to stand: 20.30 sec Timed up and go (TUG): 13.25 sec   GAIT: Distance walked: 40 Assistive device utilized: None Level of assistance: Complete Independence Comments: antalgic   TODAY'S TREATMENT:  DATE: 08/21/22 NuStep L2 x 5' PT present to discuss status Supine feet on ball roll in/out x 15 Supine hip abd red loop x15 Supine bridge with hip abd red loop x 10 Supine bridge hold with single arm chest press, 2 x 5 bil Supine 3lb weighted ball diagonals x 10 each way with legs in  table top on red physioball Supine hamstring stretch 2x20" bil Supine piriformis stretch 2 x 20" bil (knee pull to  opposite shoulder with opposite LE support) Seated deadlift 5lb kbell x 10 Hip to hip and shoulder to shoulder taps 5lb kbell x 20 each Lateral step up 2" Lt LE x 10 bil UE support Bil leg press 2x20 60lb  DATE: 08/19/22    NuStep L2 x 5' PT present to discuss status Addaday assisted STM Lt hip, buttock, post thigh Supine feet on ball roll in/out x 15 Supine hip abd red loop x10 Supine bridge with hip abd red loop x 10 Supine 3lb weighted ball diagonals x 10 each way with legs in table top on red physioball Supine hamstring stretch 2x20" bil Seated deadlift 5lb kbell x 10 Hip to hip and shoulder to shoulder taps 5lb kbell x 20 each Lateral step up 2" Lt LE x 10 bil UE support Bil leg press 2x20 60lb  DATE: 08/13/22       NuStep L5 x 5' PT present to discuss progress Supine lower trunk rotation x 20 Supine hip abd red loop x10 Supine bridge with hip abd red loop x 10 Sit to stand mat table + pad with ball b/w knees x 10 Standing 5lb kbell deadlift x 8 reps Standing row 10lb x 10 reps Backward cable pulley walking 5lb x 8 reps Seated reverse crunch holding 5lb kbell at chest x 10 Hip to hip and shoulder to shoulder taps 5lb kbell x 20 each Wall plank 3x20" with VC for proper activation of TA Issued HEP                                                                                                             PATIENT EDUCATION:  Education details: Initiated HEP Person educated: Patient Education method: Consulting civil engineer, Media planner, Verbal cues, and Handouts Education comprehension: verbalized understanding, returned demonstration, and verbal cues required   HOME EXERCISE PROGRAM: Access Code: [email or web address] URL: [email or web address] Date: 08/13/2022 Prepared by: Venetia Night Beuhring  Exercises - Hip Flexor Stretch at Edge of Bed  - 1 x daily - 7 x weekly - 1 sets - 3 reps - 20 sec hold - Supine Hamstring Stretch  - 1 x daily - 7 x weekly - 1 sets - 3 reps - 20 sec  hold - Hooklying Clamshell with Resistance  - 1 x daily - 7 x weekly - 1 sets - 10 reps - Bridge with Hip Abduction and Resistance  - 1 x daily - 7 x weekly - 1 sets - 10 reps - Modified Eccentric Sit Up in Backless Chair  - 1 x daily - 7 x weekly - 1 sets - 10 reps - Standing Plank on Wall  - 1 x daily - 7 x weekly - 1 sets - 3 reps - 20 hold - Standing Row with Anchored Resistance  - 1 x daily - 7 x weekly - 1 sets - 10 reps   ASSESSMENT:   CLINICAL IMPRESSION: Pt declined Addaday to Lt posterolateral  hip musculature today thinking it may have aggravated symptoms after last treatment session. Pt tolerated all exercises well with no increase in pain. She was able to progress repetitions of supine hip abduction and bridges with band, demonstrating improved endurance. She tolerated addition of bridge hold with single arm press to help facilitate obliques and supine piriformis stretch well. She will continue to benefit form skilled PT intervention in order to decrease pain, address impairments, and meet goals.    OBJECTIVE IMPAIRMENTS: decreased knowledge of condition, decreased mobility, difficulty walking, decreased ROM, decreased strength, increased fascial restrictions, increased muscle spasms, impaired flexibility, improper body mechanics, postural dysfunction, and pain.    ACTIVITY LIMITATIONS: carrying, lifting, bending, sitting, standing, squatting, sleeping, stairs, transfers, bathing, and dressing   PARTICIPATION LIMITATIONS: meal prep, cleaning, laundry, driving, shopping, community activity, and yard work   PERSONAL FACTORS: Fitness, Past/current experiences, Time since onset of injury/illness/exacerbation, and 1-2 comorbidities: bronchitis, diabetes  are also affecting patient's functional outcome.    REHAB POTENTIAL: Fair Hx of L4-L5 fusion, but did exercise regularly previously, pain has been present for about 3 months   CLINICAL DECISION MAKING: Stable/uncomplicated   EVALUATION  COMPLEXITY: Low     GOALS: Goals reviewed with patient? Yes   SHORT TERM GOALS: Target date: 08/26/2022     Pain report to be no greater than 4/10  Baseline: Goal status: met   2.  Patient will be independent with initial HEP  Baseline:  Goal status: ongoing     LONG TERM GOALS: Target date: 09/23/2022     Patient to be independent with advanced HEP  Baseline:  Goal status: INITIAL   2.  Patient to report pain no greater than 2/10  Baseline:  Goal status: INITIAL   3.  FOTO to improve to predicted score Baseline:  Goal status: INITIAL   4.  Patient to be able to resume gym activities Baseline:  Goal status: INITIAL   5.  Functional scores to improve by 2-3 seconds Baseline:  Goal status: INITIAL   6.  Patient to report 85% improvement in overall symptoms  Baseline:  Goal status: INITIAL   PLAN:   PT FREQUENCY: 1-2x/week   PT DURATION: 8 weeks   PLANNED INTERVENTIONS: Therapeutic exercises, Therapeutic activity, Neuromuscular re-education, Balance training, Gait training, Patient/Family education, Self Care, Joint mobilization, Stair training, DME instructions, Aquatic Therapy, Dry Needling, Electrical stimulation, Spinal mobilization, Cryotherapy, Moist heat, Splintting, Taping, Traction, Ultrasound, Ionotophoresis '4mg'$ /ml Dexamethasone, Manual therapy, and Re-evaluation.   PLAN FOR NEXT SESSION: How is Lt hip pain, Addaday assisted STM Lt hip, HS stretching, advance core and functional strength, hip abd and ER strength for knee support, review and progress HEP as needed     Heather Roberts, PT, DPT01/11/243:33 PM

## 2022-08-26 ENCOUNTER — Ambulatory Visit: Payer: Medicare Other | Admitting: Physical Therapy

## 2022-08-26 ENCOUNTER — Encounter: Payer: Self-pay | Admitting: Physical Therapy

## 2022-08-26 DIAGNOSIS — R252 Cramp and spasm: Secondary | ICD-10-CM

## 2022-08-26 DIAGNOSIS — M5459 Other low back pain: Secondary | ICD-10-CM

## 2022-08-26 DIAGNOSIS — M6281 Muscle weakness (generalized): Secondary | ICD-10-CM

## 2022-08-26 NOTE — Therapy (Signed)
OUTPATIENT PHYSICAL THERAPY TREATMENT NOTE   Patient Name: Melinda Drake MRN: [id number] DOB:May 21, 1956, 67 y.o., female Today's Date: 08/26/2022  PCP: Lujean Amel, MD REFERRING PROVIDER: Laroy Apple, MD  END OF SESSION:   PT End of Session - 08/26/22 1151     Visit Number 6    Date for PT Re-Evaluation 09/23/22    Authorization Type MEDICARE PART A AND B    Progress Note Due on Visit 10    PT Start Time 1150    PT Stop Time 1230    PT Time Calculation (min) 40 min    Activity Tolerance Patient tolerated treatment well    Behavior During Therapy WFL for tasks assessed/performed                 Past Medical History:  Diagnosis Date   Arthritis    Bronchitis    Diabetes mellitus without complication ([place])    Family history of adverse reaction to anesthesia    N/V    GERD (gastroesophageal reflux disease)    H/O stem cell transplant ([place]) 1997   History of biopsy 1997   left side neck   Non Hodgkin's lymphoma ([place]) 1997   PONV (postoperative nausea and vomiting)    Past Surgical History:  Procedure Laterality Date   ANKLE FUSION Left 04/27/2013   Procedure: ANKLE FUSION- left;  Surgeon: Newt Minion, MD;  Location: [facility];  Service: Orthopedics;  Laterality: Left;  Left Subtalar Fusion and Talonavicular Fusion   BOTOX INJECTION  06/04/2021   Procedure: BOTOX INJECTION;  Surgeon: Clarene Essex, MD;  [facility];  Service: Endoscopy;;   ESOPHAGEAL MANOMETRY N/A 03/05/2015   Procedure: ESOPHAGEAL MANOMETRY (EM);  Surgeon: Clarene Essex, MD;  [facility];  Service: Endoscopy;  Laterality: N/A;   ESOPHAGEAL MANOMETRY Bilateral 01/08/2022   Procedure: ESOPHAGEAL MANOMETRY (EM);  Surgeon: Clarene Essex, MD;  [facility];  Service: Gastroenterology;  Laterality: Bilateral;   ESOPHAGOGASTRODUODENOSCOPY (EGD) WITH PROPOFOL N/A 06/04/2021   Procedure: ESOPHAGOGASTRODUODENOSCOPY (EGD) WITH PROPOFOL;  Surgeon: Clarene Essex, MD;  [facility];  Service: Endoscopy;  Laterality: N/A;   EYE SURGERY  ? early 2000's   bilateral cataracts   LAPAROSCOPIC LYSIS OF ADHESIONS  05/03/2015   Procedure: LAPAROSCOPIC LYSIS OF ADHESIONS;  Surgeon: Michael Boston, MD;  [facility];  Service: General;;   lymphoma excision Left 1996   Patient Active Problem List   Diagnosis Date Noted   Lumbar spine instability 06/21/2020   Achalasia s/p Heller myotomy/Toupet 05/03/2015 05/03/2015   Diabetes mellitus without complication ([facility])    Arthritis     REFERRING DIAG:  LT S1 Radicular pain with MRI showing  L5-S1 Stenosis     ONSET DATE: 07/28/2022  THERAPY DIAG:  Other low back pain  Muscle weakness (generalized)  Cramp and spasm  Rationale for Evaluation and Treatment Rehabilitation  PERTINENT HISTORY:    FALLS:  Has patient fallen in last 6 months? Yes. Number of falls 2 (tripped over Christmas tree when decorating and fell in the bathroom)  PRECAUTIONS: falls  Living Environment:  Lives with: lives with their family Lives in: House/apartment Stairs: Yes: Internal: 12 steps; on left going up and External: 2 steps; on left going up Has following equipment at home: None   PATIENT GOALS: To be able to walk pain free, be able to bend down and go back to the gym and work out safely.     NEXT MD VISIT: 09/16/21  SUBJECTIVE:  SUBJECTIVE STATEMENT:  I went to gym Sunday - was having a good day.  But past two days and a day last week my pain is really bad.  Not in my back but shooting from Lt hip to foot.  I use a cane when I'm this bad.    PAIN:  Are you having pain? Yes: NPRS scale: 7-8/10 Pain location: shooting Lt hip to foot Pain description: aching and shooting Aggravating factors: housework, bending, stooping and squatting Relieving  factors: meds (gabapentin and ibuprofen)   OBJECTIVE: (objective measures completed at initial evaluation unless otherwise dated)   DIAGNOSTIC FINDINGS:  MRI showing  L5-S1 Stenosis   PATIENT SURVEYS:  FOTO 41 predicted 55   SCREENING FOR RED FLAGS: Bowel or bladder incontinence: No Spinal tumors: No Cauda equina syndrome: No Compression fracture: No Abdominal aneurysm: No   COGNITION: Overall cognitive status: Within functional limits for tasks assessed                          SENSATION: Intermittant radicular symptoms left LE along L5-S1   MUSCLE LENGTH: Hamstrings: Right 60 deg; Left 60 deg Thomas test: Right pos ; Left pos    POSTURE: rounded shoulders, forward head, and decreased lumbar lordosis   PALPATION: Pain located left S.I. area   LUMBAR ROM:    AROM eval  Flexion Fingertips to distal shin  Extension 25% with deviation to left  Right lateral flexion To joint line  Left lateral flexion To joint line  Right rotation 50%  Left rotation 50%   (Blank rows = not tested)   LOWER EXTREMITY ROM:      WFL   LOWER EXTREMITY MMT:     Generally 4 to 4+/5 with exception of bil hip extension and abduction 4-/5   LUMBAR SPECIAL TESTS:  Straight leg raise test: Positive and Slump test: Negative   FUNCTIONAL TESTS:  5 times sit to stand: 20.30 sec Timed up and go (TUG): 13.25 sec   GAIT: Distance walked: 40 Assistive device utilized: None Level of assistance: Complete Independence Comments: antalgic   TODAY'S TREATMENT:  DATE: 08/26/22 Trigger Point Dry-Needling  Treatment instructions: Expect mild to moderate muscle soreness. S/S of pneumothorax if dry needled over a lung field, and to seek immediate medical attention should they occur. Patient verbalized understanding of these instructions and education.  Patient Consent Given: Yes Education handout provided: Yes Muscles treated: Lt gluteals, piriformis, Lt deep multifidi L3-S1 Electrical  stimulation performed: No Parameters: N/A Treatment response/outcome: twitch, cramping, elongation, greater ease of motion into stretching Supine on moist heat SKTC, DKTC, lower trunk rotation on/off x 8'  DATE: 08/21/22 NuStep L2 x 5' PT present to discuss status Supine feet on ball roll in/out x 15 Supine hip abd red loop x15 Supine bridge with hip abd red loop x 10 Supine bridge hold with single arm chest press, 2 x 5 bil Supine 3lb weighted ball diagonals x 10 each way with legs in table top on red physioball Supine hamstring stretch 2x20" bil Supine piriformis stretch 2 x 20" bil (knee pull to opposite shoulder with opposite LE support) Seated deadlift 5lb kbell x 10 Hip to hip and shoulder to shoulder taps 5lb kbell x 20 each Lateral step up 2" Lt LE x 10 bil UE support Bil leg press 2x20 60lb  DATE: 08/19/22    NuStep L2 x 5' PT present to discuss status Addaday assisted STM Lt hip, buttock, post thigh Supine  feet on ball roll in/out x 15 Supine hip abd red loop x10 Supine bridge with hip abd red loop x 10 Supine 3lb weighted ball diagonals x 10 each way with legs in table top on red physioball Supine hamstring stretch 2x20" bil Seated deadlift 5lb kbell x 10 Hip to hip and shoulder to shoulder taps 5lb kbell x 20 each Lateral step up 2" Lt LE x 10 bil UE support Bil leg press 2x20 60lb                                                                                            PATIENT EDUCATION:  Education details: Initiated HEP Person educated: Patient Education method: Consulting civil engineer, Media planner, Verbal cues, and Handouts Education comprehension: verbalized understanding, returned demonstration, and verbal cues required   HOME EXERCISE PROGRAM: Access Code: [email or web address] URL: [email or web address] Date: 08/13/2022 Prepared by: Venetia Night Gracieann Stannard  Exercises - Hip Flexor Stretch at Edge of Bed  - 1 x daily - 7 x weekly - 1 sets - 3 reps - 20 sec hold - Supine  Hamstring Stretch  - 1 x daily - 7 x weekly - 1 sets - 3 reps - 20 sec hold - Hooklying Clamshell with Resistance  - 1 x daily - 7 x weekly - 1 sets - 10 reps - Bridge with Hip Abduction and Resistance  - 1 x daily - 7 x weekly - 1 sets - 10 reps - Modified Eccentric Sit Up in Backless Chair  - 1 x daily - 7 x weekly - 1 sets - 10 reps - Standing Plank on Wall  - 1 x daily - 7 x weekly - 1 sets - 3 reps - 20 hold - Standing Row with Anchored Resistance  - 1 x daily - 7 x weekly - 1 sets - 10 reps   ASSESSMENT:   CLINICAL IMPRESSION: Pt has had 2 days of increased Lt LE shooting pain.  She had a good day Sunday and went to gym and had no pain but awoke Monday in 7-8/10 pain.  She wonders if steroid injection is wearing off.  She arrived with flexed trunk and antalgic gait with report of Lt LE shooting pain from hip to foot.  Pt agreed to try DN to lumbar spine and Lt hip with good release, improved ease of motion, less pain.  Heat used end of session concurrent with LE stretching.  Aftercare for DN education provided.    OBJECTIVE IMPAIRMENTS: decreased knowledge of condition, decreased mobility, difficulty walking, decreased ROM, decreased strength, increased fascial restrictions, increased muscle spasms, impaired flexibility, improper body mechanics, postural dysfunction, and pain.    ACTIVITY LIMITATIONS: carrying, lifting, bending, sitting, standing, squatting, sleeping, stairs, transfers, bathing, and dressing   PARTICIPATION LIMITATIONS: meal prep, cleaning, laundry, driving, shopping, community activity, and yard work   PERSONAL FACTORS: Fitness, Past/current experiences, Time since onset of injury/illness/exacerbation, and 1-2 comorbidities: bronchitis, diabetes  are also affecting patient's functional outcome.    REHAB POTENTIAL: Fair Hx of L4-L5 fusion, but did exercise regularly previously, pain has been present for about 3 months   CLINICAL DECISION MAKING: Stable/uncomplicated  EVALUATION COMPLEXITY: Low     GOALS: Goals reviewed with patient? Yes   SHORT TERM GOALS: Target date: 08/26/2022     Pain report to be no greater than 4/10  Baseline: Goal status: met   2.  Patient will be independent with initial HEP  Baseline:  Goal status: ongoing     LONG TERM GOALS: Target date: 09/23/2022     Patient to be independent with advanced HEP  Baseline:  Goal status: INITIAL   2.  Patient to report pain no greater than 2/10  Baseline:  Goal status: INITIAL   3.  FOTO to improve to predicted score Baseline:  Goal status: INITIAL   4.  Patient to be able to resume gym activities Baseline:  Goal status: INITIAL   5.  Functional scores to improve by 2-3 seconds Baseline:  Goal status: INITIAL   6.  Patient to report 85% improvement in overall symptoms  Baseline:  Goal status: INITIAL   PLAN:   PT FREQUENCY: 1-2x/week   PT DURATION: 8 weeks   PLANNED INTERVENTIONS: Therapeutic exercises, Therapeutic activity, Neuromuscular re-education, Balance training, Gait training, Patient/Family education, Self Care, Joint mobilization, Stair training, DME instructions, Aquatic Therapy, Dry Needling, Electrical stimulation, Spinal mobilization, Cryotherapy, Moist heat, Splintting, Taping, Traction, Ultrasound, Ionotophoresis '4mg'$ /ml Dexamethasone, Manual therapy, and Re-evaluation.   PLAN FOR NEXT SESSION: f/u on DN#1, continue with lumbar stabilization, try gentle neural stretches     Jamey Demchak, PT 08/26/22 1:31 PM

## 2022-08-28 ENCOUNTER — Encounter: Payer: Self-pay | Admitting: Physical Therapy

## 2022-08-28 ENCOUNTER — Ambulatory Visit: Payer: Medicare Other | Admitting: Physical Therapy

## 2022-08-28 DIAGNOSIS — M5459 Other low back pain: Secondary | ICD-10-CM

## 2022-08-28 DIAGNOSIS — M6281 Muscle weakness (generalized): Secondary | ICD-10-CM

## 2022-08-28 DIAGNOSIS — R252 Cramp and spasm: Secondary | ICD-10-CM

## 2022-08-28 NOTE — Therapy (Signed)
OUTPATIENT PHYSICAL THERAPY TREATMENT NOTE   Patient Name: MESHIA RAU MRN: [id number] DOB:02-May-1956, 67 y.o., female Today's Date: 08/28/2022  PCP: Lujean Amel, MD REFERRING PROVIDER: Laroy Apple, MD  END OF SESSION:   PT End of Session - 08/28/22 1149     Visit Number 7    Date for PT Re-Evaluation 09/23/22    Authorization Type MEDICARE PART A AND B    Progress Note Due on Visit 10    PT Start Time 1149    PT Stop Time 1231    PT Time Calculation (min) 42 min    Activity Tolerance Patient tolerated treatment well    Behavior During Therapy WFL for tasks assessed/performed                  Past Medical History:  Diagnosis Date   Arthritis    Bronchitis    Diabetes mellitus without complication ([place])    Family history of adverse reaction to anesthesia    N/V    GERD (gastroesophageal reflux disease)    H/O stem cell transplant ([place]) 1997   History of biopsy 1997   left side neck   Non Hodgkin's lymphoma ([place]) 1997   PONV (postoperative nausea and vomiting)    Past Surgical History:  Procedure Laterality Date   ANKLE FUSION Left 04/27/2013   Procedure: ANKLE FUSION- left;  Surgeon: Newt Minion, MD;  Location: [facility];  Service: Orthopedics;  Laterality: Left;  Left Subtalar Fusion and Talonavicular Fusion   BOTOX INJECTION  06/04/2021   Procedure: BOTOX INJECTION;  Surgeon: Clarene Essex, MD;  [facility];  Service: Endoscopy;;   ESOPHAGEAL MANOMETRY N/A 03/05/2015   Procedure: ESOPHAGEAL MANOMETRY (EM);  Surgeon: Clarene Essex, MD;  [facility];  Service: Endoscopy;  Laterality: N/A;   ESOPHAGEAL MANOMETRY Bilateral 01/08/2022   Procedure: ESOPHAGEAL MANOMETRY (EM);  Surgeon: Clarene Essex, MD;  [facility];  Service: Gastroenterology;  Laterality: Bilateral;   ESOPHAGOGASTRODUODENOSCOPY (EGD) WITH PROPOFOL N/A 06/04/2021   Procedure: ESOPHAGOGASTRODUODENOSCOPY (EGD) WITH PROPOFOL;  Surgeon: Clarene Essex, MD;  [facility];  Service: Endoscopy;  Laterality: N/A;   EYE SURGERY  ? early 2000's   bilateral cataracts   LAPAROSCOPIC LYSIS OF ADHESIONS  05/03/2015   Procedure: LAPAROSCOPIC LYSIS OF ADHESIONS;  Surgeon: Michael Boston, MD;  [facility];  Service: General;;   lymphoma excision Left 1996   Patient Active Problem List   Diagnosis Date Noted   Lumbar spine instability 06/21/2020   Achalasia s/p Heller myotomy/Toupet 05/03/2015 05/03/2015   Diabetes mellitus without complication ([facility])    Arthritis     REFERRING DIAG:  LT S1 Radicular pain with MRI showing  L5-S1 Stenosis     ONSET DATE: 07/28/2022  THERAPY DIAG:  Other low back pain  Muscle weakness (generalized)  Cramp and spasm  Rationale for Evaluation and Treatment Rehabilitation  PERTINENT HISTORY:    FALLS:  Has patient fallen in last 6 months? Yes. Number of falls 2 (tripped over Christmas tree when decorating and fell in the bathroom)  PRECAUTIONS: falls  Living Environment:  Lives with: lives with their family Lives in: House/apartment Stairs: Yes: Internal: 12 steps; on left going up and External: 2 steps; on left going up Has following equipment at home: None   PATIENT GOALS: To be able to walk pain free, be able to bend down and go back to the gym and work out safely.     NEXT MD VISIT: 09/16/21  SUBJECTIVE:  SUBJECTIVE STATEMENT:  I didn't wake up in the night with as much pain.  I am better than I was.   PAIN:  Are you having pain? Yes: NPRS scale: 4/10 Pain location: shooting Lt hip to foot Pain description: aching and shooting Aggravating factors: housework, bending, stooping and squatting Relieving factors: meds (gabapentin and ibuprofen)   OBJECTIVE: (objective measures completed at initial evaluation unless  otherwise dated)   DIAGNOSTIC FINDINGS:  MRI showing  L5-S1 Stenosis   PATIENT SURVEYS:  FOTO 41 predicted 55   SCREENING FOR RED FLAGS: Bowel or bladder incontinence: No Spinal tumors: No Cauda equina syndrome: No Compression fracture: No Abdominal aneurysm: No   COGNITION: Overall cognitive status: Within functional limits for tasks assessed                          SENSATION: Intermittant radicular symptoms left LE along L5-S1   MUSCLE LENGTH: Hamstrings: Right 60 deg; Left 60 deg Thomas test: Right pos ; Left pos    POSTURE: rounded shoulders, forward head, and decreased lumbar lordosis   PALPATION: Pain located left S.I. area   LUMBAR ROM:    AROM eval  Flexion Fingertips to distal shin  Extension 25% with deviation to left  Right lateral flexion To joint line  Left lateral flexion To joint line  Right rotation 50%  Left rotation 50%   (Blank rows = not tested)   LOWER EXTREMITY ROM:      WFL   LOWER EXTREMITY MMT:     Generally 4 to 4+/5 with exception of bil hip extension and abduction 4-/5   LUMBAR SPECIAL TESTS:  Straight leg raise test: Positive and Slump test: Negative   FUNCTIONAL TESTS:  5 times sit to stand: 20.30 sec Timed up and go (TUG): 13.25 sec   GAIT: Distance walked: 40 Assistive device utilized: None Level of assistance: Complete Independence Comments: antalgic   TODAY'S TREATMENT:  DATE: 08/28/22: Supine sciatic flossing at knee 1x30" each LE Supine DKTC with rocking x30" Supine hip abd red loop with bridge x 10 Dying bug 2x20 Supine SLR 1x10 bil, exhale on effort VC Supine tabletop LE with heels on red ball for support yellow plyoball overhead and back x 10, diagonals x10 (Pt needed to put feet back on mat table due to Rt knee pain for final diagonal) Bil leg press 2x20 60lb Low seated row 30lb 2x10 LAQ 3lb 2x10 bil Seated deadlift 2x10 5lb NuStep L5 x 5' PT present to monitor  DATE: 08/26/22 Trigger Point  Dry-Needling  Treatment instructions: Expect mild to moderate muscle soreness. S/S of pneumothorax if dry needled over a lung field, and to seek immediate medical attention should they occur. Patient verbalized understanding of these instructions and education.  Patient Consent Given: Yes Education handout provided: Yes Muscles treated: Lt gluteals, piriformis, Lt deep multifidi L3-S1 Electrical stimulation performed: No Parameters: N/A Treatment response/outcome: twitch, cramping, elongation, greater ease of motion into stretching Supine on moist heat SKTC, DKTC, lower trunk rotation on/off x 8'  DATE: 08/21/22 NuStep L2 x 5' PT present to discuss status Supine feet on ball roll in/out x 15 Supine hip abd red loop x15 Supine bridge with hip abd red loop x 10 Supine bridge hold with single arm chest press, 2 x 5 bil Supine 3lb weighted ball diagonals x 10 each way with legs in table top on red physioball Supine hamstring stretch 2x20" bil Supine piriformis stretch 2 x 20" bil (  knee pull to opposite shoulder with opposite LE support) Seated deadlift 5lb kbell x 10 Hip to hip and shoulder to shoulder taps 5lb kbell x 20 each Lateral step up 2" Lt LE x 10 bil UE support Bil leg press 2x20 60lb                                                                                            PATIENT EDUCATION:  Education details: Initiated HEP Person educated: Patient Education method: Consulting civil engineer, Media planner, Verbal cues, and Handouts Education comprehension: verbalized understanding, returned demonstration, and verbal cues required   HOME EXERCISE PROGRAM: Access Code: [email or web address] URL: [email or web address] Date: 08/13/2022 Prepared by: Venetia Night Jeimy Bickert  Exercises - Hip Flexor Stretch at Edge of Bed  - 1 x daily - 7 x weekly - 1 sets - 3 reps - 20 sec hold - Supine Hamstring Stretch  - 1 x daily - 7 x weekly - 1 sets - 3 reps - 20 sec hold - Hooklying Clamshell with  Resistance  - 1 x daily - 7 x weekly - 1 sets - 10 reps - Bridge with Hip Abduction and Resistance  - 1 x daily - 7 x weekly - 1 sets - 10 reps - Modified Eccentric Sit Up in Backless Chair  - 1 x daily - 7 x weekly - 1 sets - 10 reps - Standing Plank on Wall  - 1 x daily - 7 x weekly - 1 sets - 3 reps - 20 hold - Standing Row with Anchored Resistance  - 1 x daily - 7 x weekly - 1 sets - 10 reps   ASSESSMENT:   CLINICAL IMPRESSION: Pt reported reduced pain and improved sleep after last session.  Arrived with pain rated 4/10 compared to 8/10 last visit.  She was able to return to prior level therex and even advance some core and upper body machines with need for trunk stabilization.  PT monitored tolerance closely throughout session.  OBJECTIVE IMPAIRMENTS: decreased knowledge of condition, decreased mobility, difficulty walking, decreased ROM, decreased strength, increased fascial restrictions, increased muscle spasms, impaired flexibility, improper body mechanics, postural dysfunction, and pain.    ACTIVITY LIMITATIONS: carrying, lifting, bending, sitting, standing, squatting, sleeping, stairs, transfers, bathing, and dressing   PARTICIPATION LIMITATIONS: meal prep, cleaning, laundry, driving, shopping, community activity, and yard work   PERSONAL FACTORS: Fitness, Past/current experiences, Time since onset of injury/illness/exacerbation, and 1-2 comorbidities: bronchitis, diabetes  are also affecting patient's functional outcome.    REHAB POTENTIAL: Fair Hx of L4-L5 fusion, but did exercise regularly previously, pain has been present for about 3 months   CLINICAL DECISION MAKING: Stable/uncomplicated   EVALUATION COMPLEXITY: Low     GOALS: Goals reviewed with patient? Yes   SHORT TERM GOALS: Target date: 08/26/2022     Pain report to be no greater than 4/10  Baseline: Goal status: met   2.  Patient will be independent with initial HEP  Baseline:  Goal status: ongoing      LONG TERM GOALS: Target date: 09/23/2022     Patient to be independent with advanced HEP  Baseline:  Goal status: INITIAL  2.  Patient to report pain no greater than 2/10  Baseline:  Goal status: INITIAL   3.  FOTO to improve to predicted score Baseline:  Goal status: INITIAL   4.  Patient to be able to resume gym activities Baseline:  Goal status: INITIAL   5.  Functional scores to improve by 2-3 seconds Baseline:  Goal status: INITIAL   6.  Patient to report 85% improvement in overall symptoms  Baseline:  Goal status: INITIAL   PLAN:   PT FREQUENCY: 1-2x/week   PT DURATION: 8 weeks   PLANNED INTERVENTIONS: Therapeutic exercises, Therapeutic activity, Neuromuscular re-education, Balance training, Gait training, Patient/Family education, Self Care, Joint mobilization, Stair training, DME instructions, Aquatic Therapy, Dry Needling, Electrical stimulation, Spinal mobilization, Cryotherapy, Moist heat, Splintting, Taping, Traction, Ultrasound, Ionotophoresis '4mg'$ /ml Dexamethasone, Manual therapy, and Re-evaluation.   PLAN FOR NEXT SESSION: DN#2, continue with lumbar stabilization, try gentle neural stretches   Treshun Wold, PT 08/28/22 12:29 PM

## 2022-09-02 ENCOUNTER — Encounter: Payer: Self-pay | Admitting: Physical Therapy

## 2022-09-02 ENCOUNTER — Ambulatory Visit: Payer: Medicare Other | Admitting: Physical Therapy

## 2022-09-02 DIAGNOSIS — M5459 Other low back pain: Secondary | ICD-10-CM

## 2022-09-02 DIAGNOSIS — M6281 Muscle weakness (generalized): Secondary | ICD-10-CM

## 2022-09-02 DIAGNOSIS — R252 Cramp and spasm: Secondary | ICD-10-CM

## 2022-09-02 NOTE — Therapy (Signed)
OUTPATIENT PHYSICAL THERAPY TREATMENT NOTE   Patient Name: REMMIE BEMBENEK MRN: [id number] DOB:06-11-1956, 67 y.o., female Today's Date: 09/02/2022  PCP: Lujean Amel, MD REFERRING PROVIDER: Laroy Apple, MD  END OF SESSION:   PT End of Session - 09/02/22 1151     Visit Number 8    Date for PT Re-Evaluation 09/23/22    Authorization Type MEDICARE PART A AND B    Progress Note Due on Visit 10    PT Start Time 1146    PT Stop Time 1225    PT Time Calculation (min) 39 min    Activity Tolerance Patient tolerated treatment well    Behavior During Therapy WFL for tasks assessed/performed                   Past Medical History:  Diagnosis Date   Arthritis    Bronchitis    Diabetes mellitus without complication ([place])    Family history of adverse reaction to anesthesia    N/V    GERD (gastroesophageal reflux disease)    H/O stem cell transplant ([place]) 1997   History of biopsy 1997   left side neck   Non Hodgkin's lymphoma ([place]) 1997   PONV (postoperative nausea and vomiting)    Past Surgical History:  Procedure Laterality Date   ANKLE FUSION Left 04/27/2013   Procedure: ANKLE FUSION- left;  Surgeon: Newt Minion, MD;  Location: [facility];  Service: Orthopedics;  Laterality: Left;  Left Subtalar Fusion and Talonavicular Fusion   BOTOX INJECTION  06/04/2021   Procedure: BOTOX INJECTION;  Surgeon: Clarene Essex, MD;  [facility];  Service: Endoscopy;;   ESOPHAGEAL MANOMETRY N/A 03/05/2015   Procedure: ESOPHAGEAL MANOMETRY (EM);  Surgeon: Clarene Essex, MD;  [facility];  Service: Endoscopy;  Laterality: N/A;   ESOPHAGEAL MANOMETRY Bilateral 01/08/2022   Procedure: ESOPHAGEAL MANOMETRY (EM);  Surgeon: Clarene Essex, MD;  [facility];  Service: Gastroenterology;  Laterality: Bilateral;   ESOPHAGOGASTRODUODENOSCOPY (EGD) WITH PROPOFOL N/A 06/04/2021   Procedure: ESOPHAGOGASTRODUODENOSCOPY (EGD) WITH PROPOFOL;  Surgeon: Clarene Essex, MD;   [facility];  Service: Endoscopy;  Laterality: N/A;   EYE SURGERY  ? early 2000's   bilateral cataracts   LAPAROSCOPIC LYSIS OF ADHESIONS  05/03/2015   Procedure: LAPAROSCOPIC LYSIS OF ADHESIONS;  Surgeon: Michael Boston, MD;  [facility];  Service: General;;   lymphoma excision Left 1996   Patient Active Problem List   Diagnosis Date Noted   Lumbar spine instability 06/21/2020   Achalasia s/p Heller myotomy/Toupet 05/03/2015 05/03/2015   Diabetes mellitus without complication ([facility])    Arthritis     REFERRING DIAG:  LT S1 Radicular pain with MRI showing  L5-S1 Stenosis     ONSET DATE: 07/28/2022  THERAPY DIAG:  Other low back pain  Muscle weakness (generalized)  Cramp and spasm  Rationale for Evaluation and Treatment Rehabilitation  PERTINENT HISTORY:    FALLS:  Has patient fallen in last 6 months? Yes. Number of falls 2 (tripped over Christmas tree when decorating and fell in the bathroom)  PRECAUTIONS: falls  Living Environment:  Lives with: lives with their family Lives in: House/apartment Stairs: Yes: Internal: 12 steps; on left going up and External: 2 steps; on left going up Has following equipment at home: None   PATIENT GOALS: To be able to walk pain free, be able to bend down and go back to the gym and work out safely.     NEXT MD VISIT: 09/16/21  SUBJECTIVE:                                                                                                                                                                                      SUBJECTIVE STATEMENT:  I went to the gym both Sat and Sun and by Sun PM and Monday I had increased pain.  My pain is always ban in the AM and once I'm up and take meds I usually feel better by afternoon.     PAIN:  Are you having pain? Yes: NPRS scale: 4-5/10 Pain location: shooting Lt hip to foot Pain description: aching and shooting Aggravating factors: housework, bending, stooping and squatting Relieving  factors: meds (gabapentin and ibuprofen)   OBJECTIVE: (objective measures completed at initial evaluation unless otherwise dated)   DIAGNOSTIC FINDINGS:  MRI showing  L5-S1 Stenosis   PATIENT SURVEYS:  FOTO 41 predicted 55   SCREENING FOR RED FLAGS: Bowel or bladder incontinence: No Spinal tumors: No Cauda equina syndrome: No Compression fracture: No Abdominal aneurysm: No   COGNITION: Overall cognitive status: Within functional limits for tasks assessed                          SENSATION: Intermittant radicular symptoms left LE along L5-S1   MUSCLE LENGTH: Hamstrings: Right 60 deg; Left 60 deg Thomas test: Right pos ; Left pos    POSTURE: rounded shoulders, forward head, and decreased lumbar lordosis   PALPATION: Pain located left S.I. area   LUMBAR ROM:    AROM eval  Flexion Fingertips to distal shin  Extension 25% with deviation to left  Right lateral flexion To joint line  Left lateral flexion To joint line  Right rotation 50%  Left rotation 50%   (Blank rows = not tested)   LOWER EXTREMITY ROM:      WFL   LOWER EXTREMITY MMT:     Generally 4 to 4+/5 with exception of bil hip extension and abduction 4-/5   LUMBAR SPECIAL TESTS:  Straight leg raise test: Positive and Slump test: Negative   FUNCTIONAL TESTS:  5 times sit to stand: 20.30 sec Timed up and go (TUG): 13.25 sec   GAIT: Distance walked: 40 Assistive device utilized: None Level of assistance: Complete Independence Comments: antalgic   TODAY'S TREATMENT:  DATE: 09/02/22: NuStep L5 x 6' PT present to discuss plan for session and status Seated yellow plyoball core series hip to hip, hip to shoulder x 20 each Seated chest press yellow plyoball x 20 Reverse sit up holding plyoball at chest x20 Seated march blue loop around knees x 20 3lb LAQ 3x10 bil  Low seated row 30lb 2x10 Bil leg press 2x20 60lb Seated deadlift 2x10 10lb  DATE: 08/28/22: Supine sciatic flossing at knee 1x30" each  LE Supine DKTC with rocking x30" Supine hip abd red loop with bridge x 10 Dying bug 2x20 Supine SLR 1x10 bil, exhale on effort VC Supine tabletop LE with heels on red ball for support yellow plyoball overhead and back x 10, diagonals x10 (Pt needed to put feet back on mat table due to Rt knee pain for final diagonal) Bil leg press 2x20 60lb Low seated row 30lb 2x10 LAQ 3lb 2x10 bil Seated deadlift 2x10 5lb NuStep L5 x 5' PT present to monitor  DATE: 08/26/22 Trigger Point Dry-Needling  Treatment instructions: Expect mild to moderate muscle soreness. S/S of pneumothorax if dry needled over a lung field, and to seek immediate medical attention should they occur. Patient verbalized understanding of these instructions and education.  Patient Consent Given: Yes Education handout provided: Yes Muscles treated: Lt gluteals, piriformis, Lt deep multifidi L3-S1 Electrical stimulation performed: No Parameters: N/A Treatment response/outcome: twitch, cramping, elongation, greater ease of motion into stretching Supine on moist heat SKTC, DKTC, lower trunk rotation on/off x 8'                                                                                     PATIENT EDUCATION:  Education details: Initiated HEP Person educated: Patient Education method: Consulting civil engineer, Media planner, Verbal cues, and Handouts Education comprehension: verbalized understanding, returned demonstration, and verbal cues required   HOME EXERCISE PROGRAM: Access Code: [email or web address] URL: [email or web address] Date: 08/13/2022 Prepared by: Venetia Night Leili Eskenazi  Exercises - Hip Flexor Stretch at Edge of Bed  - 1 x daily - 7 x weekly - 1 sets - 3 reps - 20 sec hold - Supine Hamstring Stretch  - 1 x daily - 7 x weekly - 1 sets - 3 reps - 20 sec hold - Hooklying Clamshell with Resistance  - 1 x daily - 7 x weekly - 1 sets - 10 reps - Bridge with Hip Abduction and Resistance  - 1 x daily - 7 x weekly - 1 sets - 10  reps - Modified Eccentric Sit Up in Backless Chair  - 1 x daily - 7 x weekly - 1 sets - 10 reps - Standing Plank on Wall  - 1 x daily - 7 x weekly - 1 sets - 3 reps - 20 hold - Standing Row with Anchored Resistance  - 1 x daily - 7 x weekly - 1 sets - 10 reps   ASSESSMENT:   CLINICAL IMPRESSION: Pt declined DN today feeling like it only provided short term relief last time.  She had increased pain following 2 days in a row at the gym.  She continues to have radicular pain in Lt LE daily.  She is limited in prolonged standing and walking.  She does have another ESI scheduled in early Feb which she hopes will yield relief.  She is motivated and eager to exercise.  PT reviewed what she is doing at gym and encouraged her to avoid going on consecutive days.  Pt also had unknowingly increased her resistance at gym above  what we had done in clinic.  PT and Pt in active conversation about plans for PT and understanding that we may need to d/c to gym HEP with adjustments as discussed if PT does not yield more relief over next 1-2 weeks.  OBJECTIVE IMPAIRMENTS: decreased knowledge of condition, decreased mobility, difficulty walking, decreased ROM, decreased strength, increased fascial restrictions, increased muscle spasms, impaired flexibility, improper body mechanics, postural dysfunction, and pain.    ACTIVITY LIMITATIONS: carrying, lifting, bending, sitting, standing, squatting, sleeping, stairs, transfers, bathing, and dressing   PARTICIPATION LIMITATIONS: meal prep, cleaning, laundry, driving, shopping, community activity, and yard work   PERSONAL FACTORS: Fitness, Past/current experiences, Time since onset of injury/illness/exacerbation, and 1-2 comorbidities: bronchitis, diabetes  are also affecting patient's functional outcome.    REHAB POTENTIAL: Fair Hx of L4-L5 fusion, but did exercise regularly previously, pain has been present for about 3 months   CLINICAL DECISION MAKING:  Stable/uncomplicated   EVALUATION COMPLEXITY: Low     GOALS: Goals reviewed with patient? Yes   SHORT TERM GOALS: Target date: 08/26/2022     Pain report to be no greater than 4/10  Baseline: Goal status: met   2.  Patient will be independent with initial HEP  Baseline:  Goal status: met     LONG TERM GOALS: Target date: 09/23/2022     Patient to be independent with advanced HEP  Baseline:  Goal status: INITIAL   2.  Patient to report pain no greater than 2/10  Baseline:  Goal status: INITIAL   3.  FOTO to improve to predicted score Baseline:  Goal status: INITIAL   4.  Patient to be able to resume gym activities Baseline:  Goal status: ongoing   5.  Functional scores to improve by 2-3 seconds Baseline:  Goal status: INITIAL   6.  Patient to report 85% improvement in overall symptoms  Baseline:  Goal status: INITIAL   PLAN:   PT FREQUENCY: 1-2x/week   PT DURATION: 8 weeks   PLANNED INTERVENTIONS: Therapeutic exercises, Therapeutic activity, Neuromuscular re-education, Balance training, Gait training, Patient/Family education, Self Care, Joint mobilization, Stair training, DME instructions, Aquatic Therapy, Dry Needling, Electrical stimulation, Spinal mobilization, Cryotherapy, Moist heat, Splintting, Taping, Traction, Ultrasound, Ionotophoresis '4mg'$ /ml Dexamethasone, Manual therapy, and Re-evaluation.   PLAN FOR NEXT SESSION: DN#2 if Pt interested, continue with lumbar stabilization, try gentle neural stretches   Abdulkareem Badolato, PT 09/02/22 12:27 PM

## 2022-09-04 ENCOUNTER — Encounter: Payer: Medicare Other | Admitting: Physical Therapy

## 2022-09-09 ENCOUNTER — Ambulatory Visit: Payer: Medicare Other | Admitting: Physical Therapy

## 2022-09-11 ENCOUNTER — Ambulatory Visit: Payer: Medicare Other | Attending: Physical Medicine and Rehabilitation | Admitting: Physical Therapy

## 2022-09-11 ENCOUNTER — Encounter: Payer: Self-pay | Admitting: Physical Therapy

## 2022-09-11 DIAGNOSIS — R252 Cramp and spasm: Secondary | ICD-10-CM | POA: Diagnosis present

## 2022-09-11 DIAGNOSIS — M5459 Other low back pain: Secondary | ICD-10-CM | POA: Diagnosis present

## 2022-09-11 DIAGNOSIS — M6281 Muscle weakness (generalized): Secondary | ICD-10-CM | POA: Diagnosis present

## 2022-09-11 NOTE — Therapy (Signed)
OUTPATIENT PHYSICAL THERAPY TREATMENT NOTE   Patient Name: JOHNNY LATU MRN: [id number] DOB:1956-05-25, 67 y.o., female Today's Date: 09/11/2022  PCP: Lujean Amel, MD REFERRING PROVIDER: Laroy Apple, MD  END OF SESSION:   PT End of Session - 09/11/22 1152     Visit Number 9    Date for PT Re-Evaluation 09/23/22    Authorization Type MEDICARE PART A AND B    Progress Note Due on Visit 10    PT Start Time 1149    PT Stop Time 1220    PT Time Calculation (min) 31 min    Activity Tolerance Patient tolerated treatment well    Behavior During Therapy WFL for tasks assessed/performed                   Past Medical History:  Diagnosis Date   Arthritis    Bronchitis    Diabetes mellitus without complication ([place])    Family history of adverse reaction to anesthesia    N/V    GERD (gastroesophageal reflux disease)    H/O stem cell transplant ([place]) 1997   History of biopsy 1997   left side neck   Non Hodgkin's lymphoma ([place]) 1997   PONV (postoperative nausea and vomiting)    Past Surgical History:  Procedure Laterality Date   ANKLE FUSION Left 04/27/2013   Procedure: ANKLE FUSION- left;  Surgeon: Newt Minion, MD;  Location: [facility];  Service: Orthopedics;  Laterality: Left;  Left Subtalar Fusion and Talonavicular Fusion   BOTOX INJECTION  06/04/2021   Procedure: BOTOX INJECTION;  Surgeon: Clarene Essex, MD;  [facility];  Service: Endoscopy;;   ESOPHAGEAL MANOMETRY N/A 03/05/2015   Procedure: ESOPHAGEAL MANOMETRY (EM);  Surgeon: Clarene Essex, MD;  [facility];  Service: Endoscopy;  Laterality: N/A;   ESOPHAGEAL MANOMETRY Bilateral 01/08/2022   Procedure: ESOPHAGEAL MANOMETRY (EM);  Surgeon: Clarene Essex, MD;  [facility];  Service: Gastroenterology;  Laterality: Bilateral;   ESOPHAGOGASTRODUODENOSCOPY (EGD) WITH PROPOFOL N/A 06/04/2021   Procedure: ESOPHAGOGASTRODUODENOSCOPY (EGD) WITH PROPOFOL;  Surgeon: Clarene Essex, MD;  [facility];  Service: Endoscopy;  Laterality: N/A;   EYE SURGERY  ? early 2000's   bilateral cataracts   LAPAROSCOPIC LYSIS OF ADHESIONS  05/03/2015   Procedure: LAPAROSCOPIC LYSIS OF ADHESIONS;  Surgeon: Michael Boston, MD;  [facility];  Service: General;;   lymphoma excision Left 1996   Patient Active Problem List   Diagnosis Date Noted   Lumbar spine instability 06/21/2020   Achalasia s/p Heller myotomy/Toupet 05/03/2015 05/03/2015   Diabetes mellitus without complication ([facility])    Arthritis     REFERRING DIAG:  LT S1 Radicular pain with MRI showing  L5-S1 Stenosis     ONSET DATE: 07/28/2022  THERAPY DIAG:  Other low back pain  Muscle weakness (generalized)  Cramp and spasm  Rationale for Evaluation and Treatment Rehabilitation  PERTINENT HISTORY:    FALLS:  Has patient fallen in last 6 months? Yes. Number of falls 2 (tripped over Christmas tree when decorating and fell in the bathroom)  PRECAUTIONS: falls  Living Environment:  Lives with: lives with their family Lives in: House/apartment Stairs: Yes: Internal: 12 steps; on left going up and External: 2 steps; on left going up Has following equipment at home: None   PATIENT GOALS: To be able to walk pain free, be able to bend down and go back to the gym and work out safely.     NEXT MD VISIT: 09/16/21  SUBJECTIVE:                                                                                                                                                                                      SUBJECTIVE STATEMENT:  I think I'll make today my last appointment.  I am not sure PT is helping my pain.  I do sleep better through the night but pain in the AM on waking is always bad until afternoon when my meds kick in.  I am scheduled for an ESI next week which I hope helps.  I think the last one has worn off which is why I feel like I've gone backwards.   PAIN:  Are you having pain? Yes: NPRS scale: 3/10 Pain  location: shooting Lt hip to foot Pain description: aching and shooting Aggravating factors: housework, bending, stooping and squatting Relieving factors: meds (gabapentin and ibuprofen)   OBJECTIVE: (objective measures completed at initial evaluation unless otherwise dated)   DIAGNOSTIC FINDINGS:  MRI showing  L5-S1 Stenosis   PATIENT SURVEYS:  09/11/22: FOTO 51% Eval: FOTO 41 predicted 55   SCREENING FOR RED FLAGS: Bowel or bladder incontinence: No Spinal tumors: No Cauda equina syndrome: No Compression fracture: No Abdominal aneurysm: No   COGNITION: Overall cognitive status: Within functional limits for tasks assessed                          SENSATION: Intermittant radicular symptoms left LE along L5-S1   MUSCLE LENGTH: Hamstrings: Right 60 deg; Left 60 deg Thomas test: Right pos ; Left pos    POSTURE: rounded shoulders, forward head, and decreased lumbar lordosis   PALPATION: Pain located left S.I. area   LUMBAR ROM:    AROM eval 09/11/22  Flexion Fingertips to distal shin Fingers to ankles no pain  Extension 25% with deviation to left 10 deg deviates Lt  Right lateral flexion To joint line To joint line, no pain  Left lateral flexion To joint line To joint line, no pain  Right rotation 50% 50%  Left rotation 50% 75%   (Blank rows = not tested)   LOWER EXTREMITY ROM:      WFL   LOWER EXTREMITY MMT:      09/11/22:    Bil hip abd and ext 4+/5 bil  Evaluation: Generally 4 to 4+/5 with exception of bil hip extension and abduction 4-/5   LUMBAR SPECIAL TESTS:  09/11/22: SLR + on Lt Straight leg raise test: Positive and Slump test: Negative   FUNCTIONAL TESTS:  09/11/22:  5 times sit to stand: 15  sec Timed up and go (TUG): 12  sec  Eval:  5 times sit to stand: 20.30 sec Timed up and go (TUG): 13.25 sec   GAIT: Distance walked: 40 Assistive device utilized: None Level of assistance: Complete Independence Comments: antalgic   TODAY'S TREATMENT:   DATE:  09/11/22: NuStep L5 x 8' PT present to discuss status Objective measures (see above) FOTO survey - gained 10% reaching 51% (goal 55%) Pt education discharge planning: verbal review of return to gym, pacing activity, avoid overdoing with relief from next [facility]  09/02/22: NuStep L5 x 6' PT present to discuss plan for session and status Seated yellow plyoball core series hip to hip, hip to shoulder x 20 each Seated chest press yellow plyoball x 20 Reverse sit up holding plyoball at chest x20 Seated march blue loop around knees x 20 3lb LAQ 3x10 bil Low seated row 30lb 2x10 Bil leg press 2x20 60lb Seated deadlift 2x10 10lb  DATE: 08/28/22: Supine sciatic flossing at knee 1x30" each LE Supine DKTC with rocking x30" Supine hip abd red loop with bridge x 10 Dying bug 2x20 Supine SLR 1x10 bil, exhale on effort VC Supine tabletop LE with heels on red ball for support yellow plyoball overhead and back x 10, diagonals x10 (Pt needed to put feet back on mat table due to Rt knee pain for final diagonal) Bil leg press 2x20 60lb Low seated row 30lb 2x10 LAQ 3lb 2x10 bil Seated deadlift 2x10 5lb NuStep L5 x 5' PT present to monitor  DATE: 08/26/22 Trigger Point Dry-Needling  Treatment instructions: Expect mild to moderate muscle soreness. S/S of pneumothorax if dry needled over a lung field, and to seek immediate medical attention should they occur. Patient verbalized understanding of these instructions and education.  Patient Consent Given: Yes Education handout provided: Yes Muscles treated: Lt gluteals, piriformis, Lt deep multifidi L3-S1 Electrical stimulation performed: No Parameters: N/A Treatment response/outcome: twitch, cramping, elongation, greater ease of motion into stretching Supine on moist heat SKTC, DKTC, lower trunk rotation on/off x 8'                                                                                     PATIENT EDUCATION:  Education details: Initiated  HEP Person educated: Patient Education method: Consulting civil engineer, Media planner, Verbal cues, and Handouts Education comprehension: verbalized understanding, returned demonstration, and verbal cues required   HOME EXERCISE PROGRAM: Access Code: [email or web address] URL: [email or web address] Date: 08/13/2022 Prepared by: Venetia Night Oakleigh Hesketh  Exercises - Hip Flexor Stretch at Edge of Bed  - 1 x daily - 7 x weekly - 1 sets - 3 reps - 20 sec hold - Supine Hamstring Stretch  - 1 x daily - 7 x weekly - 1 sets - 3 reps - 20 sec hold - Hooklying Clamshell with Resistance  - 1 x daily - 7 x weekly - 1 sets - 10 reps - Bridge with Hip Abduction and Resistance  - 1 x daily - 7 x weekly - 1 sets - 10 reps - Modified Eccentric Sit Up in Backless Chair  - 1 x daily - 7 x weekly - 1 sets - 10 reps - Standing Plank on Wall  - 1 x daily - 7  x weekly - 1 sets - 3 reps - 20 hold - Standing Row with Anchored Resistance  - 1 x daily - 7 x weekly - 1 sets - 10 reps   ASSESSMENT:   CLINICAL IMPRESSION: Pt reported today would be last day of PT secondary to lack of progress.  She is scheduled for another ESI next week.  Initial ESI was very beneficial and she was initially able to demo progress and good exercise tolerance within PT.  She has had more recent return of increased pain with limited standing and walking tolerance.  She did demo improved hip strength bil today and FOTO score improved by 10% showing less limitation.  She met most functional LTGs but did not make progress on pain goals.  Pt education provided today about activity pacing, gym planning, and avoidance of overdoing with relief from next ESI.  D/C to HEP with plan for another ESI.  OBJECTIVE IMPAIRMENTS: decreased knowledge of condition, decreased mobility, difficulty walking, decreased ROM, decreased strength, increased fascial restrictions, increased muscle spasms, impaired flexibility, improper body mechanics, postural dysfunction, and pain.     ACTIVITY LIMITATIONS: carrying, lifting, bending, sitting, standing, squatting, sleeping, stairs, transfers, bathing, and dressing   PARTICIPATION LIMITATIONS: meal prep, cleaning, laundry, driving, shopping, community activity, and yard work   PERSONAL FACTORS: Fitness, Past/current experiences, Time since onset of injury/illness/exacerbation, and 1-2 comorbidities: bronchitis, diabetes  are also affecting patient's functional outcome.    REHAB POTENTIAL: Fair Hx of L4-L5 fusion, but did exercise regularly previously, pain has been present for about 3 months   CLINICAL DECISION MAKING: Stable/uncomplicated   EVALUATION COMPLEXITY: Low     GOALS: Goals reviewed with patient? Yes   SHORT TERM GOALS: Target date: 08/26/2022     Pain report to be no greater than 4/10  Baseline: Goal status: met   2.  Patient will be independent with initial HEP  Baseline:  Goal status: met     LONG TERM GOALS: Target date: 09/23/2022     Patient to be independent with advanced HEP  Baseline:  Goal status: met   2.  Patient to report pain no greater than 2/10  Baseline:  Goal status: not met   3.  FOTO to improve to predicted score Baseline:  Goal status: 41% to 51%, partially met goal of 55%   4.  Patient to be able to resume gym activities Baseline:  Goal status: met   5.  Functional scores to improve by 2-3 seconds Baseline:  Goal status: met   6.  Patient to report 85% improvement in overall symptoms  Baseline:  Goal status: 10%, not met   PLAN:   PT FREQUENCY: 1-2x/week   PT DURATION: 8 weeks   PLANNED INTERVENTIONS: Therapeutic exercises, Therapeutic activity, Neuromuscular re-education, Balance training, Gait training, Patient/Family education, Self Care, Joint mobilization, Stair training, DME instructions, Aquatic Therapy, Dry Needling, Electrical stimulation, Spinal mobilization, Cryotherapy, Moist heat, Splintting, Taping, Traction, Ultrasound, Ionotophoresis '4mg'$ /ml  Dexamethasone, Manual therapy, and Re-evaluation.   PLAN FOR NEXT SESSION: d/c to HEP  PHYSICAL THERAPY DISCHARGE SUMMARY  Visits from Start of Care: 9  Current functional level related to goals / functional outcomes: See above   Remaining deficits: See above   Education / Equipment: HEP, gym plan   Patient agrees to discharge. Patient goals were partially met. Patient is being discharged due to lack of progress.  Paislei Dorval, PT 09/11/22 12:22 PM

## 2022-09-16 ENCOUNTER — Encounter: Payer: Medicare Other | Admitting: Physical Therapy

## 2022-09-18 ENCOUNTER — Encounter: Payer: Medicare Other | Admitting: Physical Therapy

## 2022-09-23 ENCOUNTER — Encounter: Payer: Medicare Other | Admitting: Physical Therapy

## 2022-12-03 ENCOUNTER — Other Ambulatory Visit: Payer: Self-pay | Admitting: Family Medicine

## 2022-12-03 DIAGNOSIS — Z1231 Encounter for screening mammogram for malignant neoplasm of breast: Secondary | ICD-10-CM

## 2022-12-04 ENCOUNTER — Other Ambulatory Visit (HOSPITAL_COMMUNITY)
Admission: RE | Admit: 2022-12-04 | Discharge: 2022-12-04 | Disposition: A | Discharge status: Home / Self Care | Payer: Medicare Other | Source: Ambulatory Visit | Attending: Obstetrics and Gynecology | Admitting: Obstetrics and Gynecology

## 2022-12-04 DIAGNOSIS — Z01419 Encounter for gynecological examination (general) (routine) without abnormal findings: Secondary | ICD-10-CM | POA: Insufficient documentation

## 2022-12-04 DIAGNOSIS — Z1151 Encounter for screening for human papillomavirus (HPV): Secondary | ICD-10-CM | POA: Diagnosis not present

## 2022-12-08 LAB — CYTOLOGY - PAP
Comment: NEGATIVE
Diagnosis: NEGATIVE
High risk HPV: NEGATIVE

## 2022-12-11 ENCOUNTER — Ambulatory Visit
Admission: RE | Admit: 2022-12-11 | Discharge: 2022-12-11 | Disposition: A | Discharge status: Home / Self Care | Payer: Medicare Other | Source: Ambulatory Visit | Attending: Family Medicine | Admitting: Family Medicine

## 2022-12-11 DIAGNOSIS — Z1231 Encounter for screening mammogram for malignant neoplasm of breast: Secondary | ICD-10-CM

## 2023-01-14 ENCOUNTER — Other Ambulatory Visit: Payer: Self-pay | Admitting: Rheumatology

## 2023-01-14 DIAGNOSIS — M461 Sacroiliitis, not elsewhere classified: Secondary | ICD-10-CM

## 2023-02-17 ENCOUNTER — Ambulatory Visit
Admission: RE | Admit: 2023-02-17 | Discharge: 2023-02-17 | Disposition: A | Discharge status: Home / Self Care | Payer: Medicare Other | Source: Ambulatory Visit | Attending: Rheumatology | Admitting: Rheumatology

## 2023-02-17 DIAGNOSIS — M461 Sacroiliitis, not elsewhere classified: Secondary | ICD-10-CM

## 2023-02-17 MED ORDER — GADOPICLENOL 0.5 MMOL/ML IV SOLN
7.5000 mL | Freq: Once | INTRAVENOUS | Status: AC | PRN
  Administered 2023-02-17: 7.5 mL via INTRAVENOUS

## 2023-02-27 ENCOUNTER — Other Ambulatory Visit: Payer: Medicare Other

## 2023-06-01 ENCOUNTER — Inpatient Hospital Stay: Payer: Medicare Other | Attending: Oncology

## 2023-06-01 ENCOUNTER — Encounter: Payer: Self-pay | Admitting: Oncology

## 2023-06-01 ENCOUNTER — Inpatient Hospital Stay (HOSPITAL_BASED_OUTPATIENT_CLINIC_OR_DEPARTMENT_OTHER): Payer: Medicare Other | Admitting: Oncology

## 2023-06-01 VITALS — BP 145/60 | HR 86 | Temp 98.1°F | Resp 18 | Ht 64.0 in | Wt 162.8 lb

## 2023-06-01 DIAGNOSIS — M1990 Unspecified osteoarthritis, unspecified site: Secondary | ICD-10-CM | POA: Insufficient documentation

## 2023-06-01 DIAGNOSIS — Z9484 Stem cells transplant status: Secondary | ICD-10-CM | POA: Diagnosis not present

## 2023-06-01 DIAGNOSIS — Z9884 Bariatric surgery status: Secondary | ICD-10-CM

## 2023-06-01 DIAGNOSIS — Z8572 Personal history of non-Hodgkin lymphomas: Secondary | ICD-10-CM | POA: Insufficient documentation

## 2023-06-01 DIAGNOSIS — Z9221 Personal history of antineoplastic chemotherapy: Secondary | ICD-10-CM | POA: Diagnosis not present

## 2023-06-01 DIAGNOSIS — D649 Anemia, unspecified: Secondary | ICD-10-CM | POA: Insufficient documentation

## 2023-06-01 DIAGNOSIS — K220 Achalasia of cardia: Secondary | ICD-10-CM

## 2023-06-01 DIAGNOSIS — Z87891 Personal history of nicotine dependence: Secondary | ICD-10-CM | POA: Insufficient documentation

## 2023-06-01 DIAGNOSIS — E119 Type 2 diabetes mellitus without complications: Secondary | ICD-10-CM

## 2023-06-01 DIAGNOSIS — Z79899 Other long term (current) drug therapy: Secondary | ICD-10-CM | POA: Insufficient documentation

## 2023-06-01 DIAGNOSIS — Z0000 Encounter for general adult medical examination without abnormal findings: Secondary | ICD-10-CM | POA: Insufficient documentation

## 2023-06-01 LAB — CBC WITH DIFFERENTIAL/PLATELET
Abs Immature Granulocytes: 0.1 10*3/uL — ABNORMAL HIGH (ref 0.00–0.07)
Basophils Absolute: 0.1 10*3/uL (ref 0.0–0.1)
Basophils Relative: 1 %
Eosinophils Absolute: 0.2 10*3/uL (ref 0.0–0.5)
Eosinophils Relative: 2 %
HCT: 31.1 % — ABNORMAL LOW (ref 36.0–46.0)
Hemoglobin: 10.4 g/dL — ABNORMAL LOW (ref 12.0–15.0)
Immature Granulocytes: 1 %
Lymphocytes Relative: 18 %
Lymphs Abs: 1.8 10*3/uL (ref 0.7–4.0)
MCH: 31 pg (ref 26.0–34.0)
MCHC: 33.4 g/dL (ref 30.0–36.0)
MCV: 92.6 fL (ref 80.0–100.0)
Monocytes Absolute: 0.8 10*3/uL (ref 0.1–1.0)
Monocytes Relative: 7 %
Neutro Abs: 7.4 10*3/uL (ref 1.7–7.7)
Neutrophils Relative %: 71 %
Platelets: 342 10*3/uL (ref 150–400)
RBC: 3.36 MIL/uL — ABNORMAL LOW (ref 3.87–5.11)
RDW: 14.9 % (ref 11.5–15.5)
WBC: 10.3 10*3/uL (ref 4.0–10.5)
nRBC: 0 % (ref 0.0–0.2)

## 2023-06-01 LAB — COMPREHENSIVE METABOLIC PANEL
ALT: 8 U/L (ref 0–44)
AST: 12 U/L — ABNORMAL LOW (ref 15–41)
Albumin: 4.3 g/dL (ref 3.5–5.0)
Alkaline Phosphatase: 98 U/L (ref 38–126)
Anion gap: 6 (ref 5–15)
BUN: 21 mg/dL (ref 8–23)
CO2: 30 mmol/L (ref 22–32)
Calcium: 10 mg/dL (ref 8.9–10.3)
Chloride: 104 mmol/L (ref 98–111)
Creatinine, Ser: 0.7 mg/dL (ref 0.44–1.00)
GFR, Estimated: >60 mL/min (ref >60)
Glucose, Bld: 99 mg/dL (ref 70–99)
Potassium: 3.8 mmol/L (ref 3.5–5.1)
Sodium: 140 mmol/L (ref 135–145)
Total Bilirubin: 0.5 mg/dL (ref 0.3–1.2)
Total Protein: 7 g/dL (ref 6.5–8.1)

## 2023-06-01 LAB — IRON AND IRON BINDING CAPACITY (CC-WL,HP ONLY)
Iron: 70 ug/dL (ref 28–170)
Saturation Ratios: 19 % (ref 10.4–31.8)
TIBC: 367 ug/dL (ref 250–450)
UIBC: 297 ug/dL

## 2023-06-01 LAB — VITAMIN B12: Vitamin B-12: 365 pg/mL (ref 180–914)

## 2023-06-01 LAB — RETICULOCYTES
Immature Retic Fract: 11.7 % (ref 2.3–15.9)
RBC.: 3.4 MIL/uL — ABNORMAL LOW (ref 3.87–5.11)
Retic Count, Absolute: 34 10*3/uL (ref 19.0–186.0)
Retic Ct Pct: 1 % (ref 0.4–3.1)

## 2023-06-01 LAB — LACTATE DEHYDROGENASE: LDH: 180 U/L (ref 98–192)

## 2023-06-01 LAB — FOLATE: Folate: 13.6 ng/mL (ref >5.9)

## 2023-06-01 LAB — TSH: TSH: 0.663 u[IU]/mL (ref 0.350–4.500)

## 2023-06-01 LAB — DIRECT ANTIGLOBULIN TEST (NOT AT ARMC)
DAT, IgG: NEGATIVE
DAT, complement: NEGATIVE

## 2023-06-01 LAB — FERRITIN: Ferritin: 26 ng/mL (ref 11–307)

## 2023-06-01 NOTE — Assessment & Plan Note (Signed)
-  Mammogram up to date as of May 2024. -Due for colonoscopy in 2026.

## 2023-06-01 NOTE — Assessment & Plan Note (Signed)
History of achalasia with esophageal damage. Recent endoscopy with attempted placement of capsule to measure acid reflux levels. -Continue follow-up with her gastroenterologist

## 2023-06-01 NOTE — Assessment & Plan Note (Addendum)
Hemoglobin 10.4 (normal 12-15). No symptoms of anemia reported. No evidence of bleeding.  OTC oral iron supplementation started 3 weeks ago.  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 ?  Could be contributing to normocytic anemia. -Ferritin, LDH, TSH, CMP are unremarkable today.  Rest of the iron studies, B12, folate, autoimmune workup pending. -Plan to follow up with results next week via phone call. -Given only mild, chronic anemia, no acute hematologic intervention is needed at current values.  Will continue to monitor for now.  Patient was provided reassurance.  If above workup is unyielding, we will pursue additional workup.  Clinical picture is not concerning for monoclonal gammopathy or other ominous etiologies. -Return visit in 3 months to reassess anemia.

## 2023-06-01 NOTE — Assessment & Plan Note (Signed)
-  Remains in remission clinically.  No concerning symptoms clinically.  No concerning lymphadenopathy or hepatosplenomegaly on exam. -No indication for routine imaging.

## 2023-06-01 NOTE — Assessment & Plan Note (Signed)
History of back surgery in 2021. Current pain managed with ibuprofen 800mg  and gabapentin. -Continue current management.

## 2023-06-01 NOTE — Progress Notes (Signed)
[facility]  HEMATOLOGY CLINIC CONSULTATION NOTE    PATIENT NAME: Melinda Drake   MR#: [id number] DOB: 1956/06/22  DATE OF SERVICE: 06/01/2023  Patient Care Team: Darrow Bussing, MD as PCP - General (Family Medicine) Karie Soda, MD as Consulting Physician (General Surgery) Vida Rigger, MD as Consulting Physician (Gastroenterology) Maeola Harman, MD as Consulting Physician (Neurosurgery)  REASON FOR CONSULTATION/ CHIEF COMPLAINT:  Evaluation of anemia.  HISTORY OF PRESENT ILLNESS:  Melinda Drake is a 67 y.o. lady with a past medical history of Non-Hodgkin's Lymphoma diagnosed in 1996, treated with chemo followed by autologous stem cell transplant in 1997, in remission, dyslipidemia, diabetes mellitus, achalasia cardia, arthritis, was referred to our service for evaluation of normocytic anemia.    Discussed the use of AI scribe software for clinical note transcription with the patient, who gave verbal consent to proceed.   Hemoglobin was 10.3 in March 2024, later 9.8, 10.7, 9.6 on recheck in September 2024. Because of persistent anemia, in the context of her PMHx, referral was sent to [place] for further evaluation of anemia.  The patient had peroral endoscopic myotomy (POEM) for achalasia cardia in January 2024 at [facility] by Dr. Marcheta Grammes.   The patient reports feeling generally well, with no symptoms of fatigue, chest pain, trouble breathing, dizziness, lightheadedness, or palpitations. The patient denies any recent infections, bleeding, or unusual cravings. The patient has been taking ibuprofen for arthritis and back pain, gabapentin, and recently started taking over-the-counter iron supplements. The patient reports constipation, which may have been exacerbated by the iron supplements. The patient's diabetes is reportedly under good control. The patient's achalasia has been causing some weight loss due to difficulty swallowing certain foods.  She had  not noticed any recent bleeding such as epistaxis, hematuria or hematochezia.  Her last colonoscopy was 2016.  Her age appropriate screening programs are up-to-date.  She denies any pica and eats a variety of diet.  She takes OTC oral iron supplements and she takes 1 tablet twice a week.   MEDICAL HISTORY:  Past Medical History:  Diagnosis Date   Arthritis    Bronchitis    Diabetes mellitus without complication (HCC)    Family history of adverse reaction to anesthesia    N/V    GERD (gastroesophageal reflux disease)    H/O stem cell transplant (HCC) 1997   History of biopsy 1997   left side neck   Non Hodgkin's lymphoma (HCC) 1997   PONV (postoperative nausea and vomiting)     SURGICAL HISTORY: Past Surgical History:  Procedure Laterality Date   ANKLE FUSION Left 04/27/2013   Procedure: ANKLE FUSION- left;  Surgeon: Nadara Mustard, MD;  Location: [facility] OR;  Service: Orthopedics;  Laterality: Left;  Left Subtalar Fusion and Talonavicular Fusion   BOTOX INJECTION  06/04/2021   Procedure: BOTOX INJECTION;  Surgeon: Vida Rigger, MD;  Location: [facility];  Service: Endoscopy;;   ESOPHAGEAL MANOMETRY N/A 03/05/2015   Procedure: ESOPHAGEAL MANOMETRY (EM);  Surgeon: Vida Rigger, MD;  Location: [facility];  Service: Endoscopy;  Laterality: N/A;   ESOPHAGEAL MANOMETRY Bilateral 01/08/2022   Procedure: ESOPHAGEAL MANOMETRY (EM);  Surgeon: Vida Rigger, MD;  Location: [facility];  Service: Gastroenterology;  Laterality: Bilateral;   ESOPHAGOGASTRODUODENOSCOPY (EGD) WITH PROPOFOL N/A 06/04/2021   Procedure: ESOPHAGOGASTRODUODENOSCOPY (EGD) WITH PROPOFOL;  Surgeon: Vida Rigger, MD;  Location: [facility];  Service: Endoscopy;  Laterality: N/A;   EYE SURGERY  ? early 2000's   bilateral cataracts   LAPAROSCOPIC  LYSIS OF ADHESIONS  05/03/2015   Procedure: LAPAROSCOPIC LYSIS OF ADHESIONS;  Surgeon: Karie Soda, MD;  Location: [facility];  Service: General;;   lymphoma excision Left 1996     SOCIAL HISTORY: She reports that she has quit smoking. Her smoking use included cigarettes. She has never used smokeless tobacco. She reports current alcohol use. She reports that she does not use drugs. Social History   Socioeconomic History   Marital status: Single    Spouse name: Not on file   Number of children: Not on file   Years of education: Not on file   Highest education level: Not on file  Occupational History   Not on file  Tobacco Use   Smoking status: Former    Current packs/day: 0.25    Types: Cigarettes   Smokeless tobacco: Never  Vaping Use   Vaping status: Never Used  Substance and Sexual Activity   Alcohol use: Yes    Comment: socially   Drug use: No   Sexual activity: Not Currently  Other Topics Concern   Not on file  Social History Narrative   Not on file   Social Determinants of Health   Financial Resource Strain: Low Risk  (06/01/2023)   Overall Financial Resource Strain (CARDIA)    Difficulty of Paying Living Expenses: Not hard at all  Food Insecurity: No Food Insecurity (06/01/2023)   Hunger Vital Sign    Worried About Running Out of Food in the Last Year: Never true    Ran Out of Food in the Last Year: Never true  Transportation Needs: No Transportation Needs (06/01/2023)   PRAPARE - Administrator, Civil Service (Medical): No    Lack of Transportation (Non-Medical): No  Physical Activity: Sufficiently Active (06/01/2023)   Exercise Vital Sign    Days of Exercise per Week: 4 days    Minutes of Exercise per Session: 40 min  Stress: No Stress Concern Present (06/01/2023)   [employer] of Occupational Health - Occupational Stress Questionnaire    Feeling of Stress : Not at all  Social Connections: Moderately Integrated (06/01/2023)   Social Connection and Isolation Panel [NHANES]    Frequency of Communication with Friends and Family: Three times a week    Frequency of Social Gatherings with Friends and Family: Three  times a week    Attends Religious Services: More than 4 times per year    Active Member of Clubs or Organizations: Yes    Attends Banker Meetings: More than 4 times per year    Marital Status: Never married  Intimate Partner Violence: Not At Risk (06/01/2023)   Humiliation, Afraid, Rape, and Kick questionnaire    Fear of Current or Ex-Partner: No    Emotionally Abused: No    Physically Abused: No    Sexually Abused: No    FAMILY HISTORY: Family History  Problem Relation Age of Onset   Diabetes Mother    Coronary artery disease Mother    Hypertension Mother    Thyroid nodules Sister    Hypertension Sister     ALLERGIES:  She is allergic to codeine and other.  MEDICATIONS:  Current Outpatient Medications  Medication Sig Dispense Refill   acetaminophen (TYLENOL) 650 MG CR tablet Take 1,300 mg by mouth every 8 (eight) hours as needed for pain.     atorvastatin (LIPITOR) 20 MG tablet Take 20 mg by mouth daily.     cetirizine (ZYRTEC) 10 MG tablet Take 10 mg by mouth  daily as needed for allergies.      Cholecalciferol (DIALYVITE VITAMIN D 5000) 125 MCG (5000 UT) capsule Take 15,000 Units by mouth once a week.     insulin glargine (LANTUS) 100 UNIT/ML injection Inject 20 Units into the skin daily.     insulin lispro (HUMALOG) 100 UNIT/ML injection HumaLOG     omeprazole (PRILOSEC) 20 MG capsule Take 20 mg by mouth daily.     No current facility-administered medications for this visit.    REVIEW OF SYSTEMS:    Review of Systems - Oncology  All other pertinent systems were reviewed and were negative except as mentioned above.  PHYSICAL EXAMINATION:  ECOG PERFORMANCE STATUS: 1 - Symptomatic but completely ambulatory  Vitals:   06/01/23 0837  BP: (!) 145/60  Pulse: 86  Resp: 18  Temp: 98.1 F (36.7 C)  SpO2: 98%   Filed Weights   06/01/23 0837  Weight: 162 lb 12.8 oz (73.8 kg)    Physical Exam Constitutional:      General: She is not in acute  distress.    Appearance: Normal appearance. She is well-developed.  HENT:     Head: Normocephalic and atraumatic.     Mouth/Throat:     Mouth: Mucous membranes are moist.  Eyes:     General: No scleral icterus.    Extraocular Movements: Extraocular movements intact.     Conjunctiva/sclera: Conjunctivae normal.  Cardiovascular:     Rate and Rhythm: Normal rate and regular rhythm.     Heart sounds: Normal heart sounds.  Pulmonary:     Effort: Pulmonary effort is normal. No respiratory distress.     Breath sounds: Normal breath sounds.  Abdominal:     General: There is no distension.     Palpations: Abdomen is soft.     Tenderness: There is no abdominal tenderness.  Musculoskeletal:     Right lower leg: No edema.     Left lower leg: No edema.  Lymphadenopathy:     Cervical: No cervical adenopathy.  Skin:    General: Skin is warm and dry.     Findings: No rash.  Neurological:     General: No focal deficit present.     Mental Status: She is alert and oriented to person, place, and time.  Psychiatric:        Mood and Affect: Mood normal.        Behavior: Behavior normal.        Thought Content: Thought content normal.        Judgment: Judgment normal.     LABORATORY DATA:   I have reviewed the data as listed.  Results for orders placed or performed in visit on 06/01/23  Reticulocytes  Result Value Ref Range   Retic Ct Pct 1.0 0.4 - 3.1 %   RBC. 3.40 (L) 3.87 - 5.11 MIL/uL   Retic Count, Absolute 34.0 19.0 - 186.0 K/uL   Immature Retic Fract 11.7 2.3 - 15.9 %  TSH  Result Value Ref Range   TSH 0.663 0.350 - 4.500 uIU/mL  Lactate dehydrogenase  Result Value Ref Range   LDH 180 98 - 192 U/L  Ferritin  Result Value Ref Range   Ferritin 26 11 - 307 ng/mL  Comprehensive metabolic panel  Result Value Ref Range   Sodium 140 135 - 145 mmol/L   Potassium 3.8 3.5 - 5.1 mmol/L   Chloride 104 98 - 111 mmol/L   CO2 30 22 - 32 mmol/L   Glucose, Bld  99 70 - 99 mg/dL   BUN  21 8 - 23 mg/dL   Creatinine, Ser 8.46 0.44 - 1.00 mg/dL   Calcium 96.2 8.9 - 95.2 mg/dL   Total Protein 7.0 6.5 - 8.1 g/dL   Albumin 4.3 3.5 - 5.0 g/dL   AST 12 (L) 15 - 41 U/L   ALT 8 0 - 44 U/L   Alkaline Phosphatase 98 38 - 126 U/L   Total Bilirubin 0.5 0.3 - 1.2 mg/dL   GFR, Estimated >84 >13 mL/min   Anion gap 6 5 - 15  CBC with Differential/Platelet  Result Value Ref Range   WBC 10.3 4.0 - 10.5 K/uL   RBC 3.36 (L) 3.87 - 5.11 MIL/uL   Hemoglobin 10.4 (L) 12.0 - 15.0 g/dL   HCT 24.4 (L) 01.0 - 27.2 %   MCV 92.6 80.0 - 100.0 fL   MCH 31.0 26.0 - 34.0 pg   MCHC 33.4 30.0 - 36.0 g/dL   RDW 53.6 64.4 - 03.4 %   Platelets 342 150 - 400 K/uL   nRBC 0.0 0.0 - 0.2 %   Neutrophils Relative % 71 %   Neutro Abs 7.4 1.7 - 7.7 K/uL   Lymphocytes Relative 18 %   Lymphs Abs 1.8 0.7 - 4.0 K/uL   Monocytes Relative 7 %   Monocytes Absolute 0.8 0.1 - 1.0 K/uL   Eosinophils Relative 2 %   Eosinophils Absolute 0.2 0.0 - 0.5 K/uL   Basophils Relative 1 %   Basophils Absolute 0.1 0.0 - 0.1 K/uL   Immature Granulocytes 1 %   Abs Immature Granulocytes 0.10 (H) 0.00 - 0.07 K/uL    RADIOGRAPHIC STUDIES:  Mammogram showed benign findings in May 2024.  No other pertinent imaging studies to review.    ASSESSMENT & PLAN:   67 y.o. lady with a past medical history of Non-Hodgkin's Lymphoma diagnosed in 1996, treated with chemo followed by autologous stem cell transplant in 1997, in remission, dyslipidemia, diabetes mellitus, achalasia cardia, arthritis, was referred to our service for evaluation of normocytic anemia.    Normocytic normochromic anemia Hemoglobin 10.4 (normal 12-15). No symptoms of anemia reported. No evidence of bleeding.  OTC oral iron supplementation started 3 weeks ago.  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 ?  Could be contributing to normocytic anemia. -Ferritin, LDH, TSH, CMP are unremarkable today.  Rest of  the iron studies, B12, folate, autoimmune workup pending. -Plan to follow up with results next week via phone call. -Given only mild, chronic anemia, no acute hematologic intervention is needed at current values.  Will continue to monitor for now.  Patient was provided reassurance.  If above workup is unyielding, we will pursue additional workup.  Clinical picture is not concerning for monoclonal gammopathy or other ominous etiologies. -Return visit in 3 months to reassess anemia.  Achalasia s/p Heller myotomy/Toupet 05/03/2015 History of achalasia with esophageal damage. Recent endoscopy with attempted placement of capsule to measure acid reflux levels. -Continue follow-up with her gastroenterologist  Arthritis History of back surgery in 2021. Current pain managed with ibuprofen 800mg  and gabapentin. -Continue current management.  Healthcare maintenance -Mammogram up to date as of May 2024. -Due for colonoscopy in 2026.  Hx of non-Hodgkin's lymphoma -Remains in remission clinically.  No concerning symptoms clinically.  No concerning lymphadenopathy or hepatosplenomegaly on exam. -No indication for routine imaging.     Orders Placed This Encounter  Procedures   CBC with Differential/Platelet  Standing Status:   Future    Number of Occurrences:   1    Standing Expiration Date:   05/31/2024   Comprehensive metabolic panel    Standing Status:   Future    Number of Occurrences:   1    Standing Expiration Date:   05/31/2024   Ferritin    Standing Status:   Future    Number of Occurrences:   1    Standing Expiration Date:   05/31/2024   Folate    Standing Status:   Future    Number of Occurrences:   1    Standing Expiration Date:   05/31/2024   Haptoglobin    Standing Status:   Future    Number of Occurrences:   1    Standing Expiration Date:   05/31/2024   Iron and Iron Binding Capacity ([facility] only)    Standing Status:   Future    Number of Occurrences:   1    Standing  Expiration Date:   05/31/2024   Lactate dehydrogenase    Standing Status:   Future    Number of Occurrences:   1    Standing Expiration Date:   05/31/2024   TSH    Standing Status:   Future    Number of Occurrences:   1    Standing Expiration Date:   05/31/2024   Vitamin B12    Standing Status:   Future    Number of Occurrences:   1    Standing Expiration Date:   05/31/2024   Reticulocytes    Standing Status:   Future    Number of Occurrences:   1    Standing Expiration Date:   05/31/2024   Direct antiglobulin test (not at [facility])    Standing Status:   Future    Number of Occurrences:   1    Standing Expiration Date:   05/31/2024     The total time spent in the appointment was 55 minutes encounter with patients including review of chart and various tests results, discussions about plan of care and coordination of care plan.  I reviewed lab results and outside records for this visit and discussed relevant results with the patient. Diagnosis, plan of care and treatment options were also discussed in detail with the patient. Opportunity provided to ask questions and answers provided to her apparent satisfaction. Provided instructions to call our clinic with any problems, questions or concerns prior to return visit. I recommended to continue follow-up with PCP and sub-specialists. She verbalized understanding and agreed with the plan. No barriers to learning was detected.   Future Appointments  Date Time Provider Department Center  06/08/2023 11:00 AM Meryl Crutch, MD [facility] None  09/01/2023 10:45 AM [facility] PHLEBOTOMIST [facility] None  09/01/2023 11:00 AM Jasmene Goswami, Archie Patten, MD [facility] None     Meryl Crutch, MD [facility] [address] [place] [address] Dept: [phone] Dept Fax: [phone]  06/01/2023 3:28 PM   This document was completed utilizing speech recognition software. Grammatical errors, random word  insertions, pronoun errors, and incomplete sentences are an occasional consequence of this system due to software limitations, ambient noise, and hardware issues. Any formal questions or concerns about the content, text or information contained within the body of this dictation should be directly addressed to the provider for clarification.

## 2023-06-02 LAB — HAPTOGLOBIN: Haptoglobin: 290 mg/dL (ref 37–355)

## 2023-06-08 ENCOUNTER — Inpatient Hospital Stay: Payer: Medicare Other | Admitting: Oncology

## 2023-06-15 ENCOUNTER — Inpatient Hospital Stay: Payer: Medicare Other | Admitting: Oncology

## 2023-06-23 ENCOUNTER — Encounter: Payer: Self-pay | Admitting: Oncology

## 2023-06-23 ENCOUNTER — Inpatient Hospital Stay: Payer: Medicare Other | Attending: Oncology | Admitting: Oncology

## 2023-06-23 ENCOUNTER — Telehealth: Payer: Self-pay | Admitting: Oncology

## 2023-06-23 DIAGNOSIS — Z8572 Personal history of non-Hodgkin lymphomas: Secondary | ICD-10-CM

## 2023-06-23 DIAGNOSIS — Z0000 Encounter for general adult medical examination without abnormal findings: Secondary | ICD-10-CM | POA: Diagnosis not present

## 2023-06-23 DIAGNOSIS — D649 Anemia, unspecified: Secondary | ICD-10-CM | POA: Diagnosis not present

## 2023-06-23 DIAGNOSIS — K220 Achalasia of cardia: Secondary | ICD-10-CM | POA: Diagnosis not present

## 2023-06-23 NOTE — Assessment & Plan Note (Signed)
History of achalasia with esophageal damage. Recent endoscopy with attempted placement of capsule to measure acid reflux levels. -Continue follow-up with her gastroenterologist

## 2023-06-23 NOTE — Progress Notes (Signed)
[facility]  HEMATOLOGY-ONCOLOGY ELECTRONIC VISIT PROGRESS NOTE  Patient Care Team: Darrow Bussing, MD as PCP - General (Family Medicine) Karie Soda, MD as Consulting Physician (General Surgery) Vida Rigger, MD as Consulting Physician (Gastroenterology) Maeola Harman, MD as Consulting Physician (Neurosurgery)  I connected with the patient via telephone conference and verified that I am speaking with the correct person using two identifiers. The patient's location is at home and I am providing care from the [facility].  I discussed the limitations, risks, security and privacy concerns of performing an evaluation and management service by e-visits and the availability of in person appointments.  I also discussed with the patient that there may be a patient responsible charge related to this service. The patient expressed understanding and agreed to proceed.   ASSESSMENT & PLAN:   67 y.o. lady with a past medical history of Non-Hodgkin's Lymphoma diagnosed in 1996, treated with chemo followed by autologous stem cell transplant in 1997, in remission, dyslipidemia, diabetes mellitus, achalasia cardia, arthritis, was referred to our service in October 2024 for evaluation of normocytic anemia.    Normocytic normochromic anemia - On her initial consultation with [place] on 06/01/2023, labs showed hemoglobin of 10.4, hematocrit 31.1, MCV 92.6.  White count was 10,300 with normal differential.  Platelet count was normal at 342,000.  Reticulocyte count was normal.  Iron studies, B12, folate, TSH, LDH, haptoglobin were all within normal limits.  CMP unremarkable.  Coombs test negative.  History of non-Hodgkin's lymphoma treated with chemotherapy and autologous stem cell transplant. ?  Could be contributing to normocytic anemia.  Given only mild, chronic anemia, no acute hematologic intervention is needed at current values.  Will continue to monitor for now.  Clinical picture is not  concerning for monoclonal gammopathy or other ominous etiologies.  Patient was provided reassurance.  No symptoms of anemia reported. No evidence of bleeding.  OTC oral iron supplementation started in October 2024.  Plan to pursue additional workup in January 2025 on return visit, to rule out monoclonal gammopathy, copper deficiency or other etiologies.  Will arrange for lab appointment 2 weeks prior to return visit so that all the results will be available to discuss, when we see her.  Hx of non-Hodgkin's lymphoma -Remains in remission clinically.  No concerning symptoms clinically.  No concerning lymphadenopathy or hepatosplenomegaly on exam. -No indication for routine imaging.  Achalasia s/p Heller myotomy/T[place]et 05/03/2015 History of achalasia with esophageal damage. Recent endoscopy with attempted placement of capsule to measure acid reflux levels. -Continue follow-up with her gastroenterologist  Healthcare maintenance -Mammogram up to date as of May 2024. -Due for colonoscopy in 2026.   Orders Placed This Encounter  Procedures   CBC with Differential/Platelet    Standing Status:   Future    Standing Expiration Date:   06/22/2024   Comprehensive metabolic panel    Standing Status:   Future    Standing Expiration Date:   06/22/2024   Lactate dehydrogenase    Standing Status:   Future    Standing Expiration Date:   06/22/2024   Copper, serum    Standing Status:   Future    Standing Expiration Date:   06/22/2024   Methylmalonic acid, serum    Standing Status:   Future    Standing Expiration Date:   06/22/2024   Multiple Myeloma Panel (SPEP&IFE w/QIG)    Standing Status:   Future    Standing Expiration Date:   06/22/2024   Kappa/lambda light chains  Standing Status:   Future    Standing Expiration Date:   06/22/2024   Ferritin    Standing Status:   Future    Standing Expiration Date:   06/22/2024   Iron and Iron Binding Capacity ([facility] only)    Standing Status:    Future    Standing Expiration Date:   06/22/2024    INTERVAL HISTORY:  Please see above for problem oriented charting.  The purpose of today's discussion is to explain recent lab results and formulate plan of care.  The patient's hemoglobin level was 10.4 on October 21st, which is slightly lower than her usual range of 11 to 12. The patient has been taking iron supplements twice a week, which has kept her iron levels within the normal range. The patient denies any blood loss and has had a colonoscopy in 2016.  HEMATOLOGY HISTORY:  67 y.o. lady with a past medical history of Non-Hodgkin's Lymphoma diagnosed in 1996, treated with chemo followed by autologous stem cell transplant in 1997, in remission, dyslipidemia, diabetes mellitus, achalasia cardia, arthritis, was referred to our service in October 2024 for evaluation of normocytic anemia.     Hemoglobin was 10.3 in March 2024, later 9.8, 10.7, 9.6 on recheck in September 2024. Because of persistent anemia, in the context of her PMHx, referral was sent to [place] for further evaluation of anemia.   The patient had peroral endoscopic myotomy (POEM) for achalasia cardia in January 2024 at [facility] by Dr. Marcheta Grammes.    She had not noticed any recent bleeding such as epistaxis, hematuria or hematochezia.   Her last colonoscopy was 2016.   Her age appropriate screening programs are up-to-date.   She denies any pica and eats a variety of diet.   She takes OTC oral iron supplements and she takes 1 tablet twice a week.   On her initial consultation with [place] on 06/01/2023, labs showed hemoglobin of 10.4, hematocrit 31.1, MCV 92.6.  White count was 10,300 with normal differential.  Platelet count was normal at 342,000.  Reticulocyte count was normal.  Iron studies, B12, folate, TSH, LDH, haptoglobin were all within normal limits.  CMP unremarkable.  Coombs test negative.  History of non-Hodgkin's lymphoma treated with chemotherapy and autologous  stem cell transplant. ?  Could be contributing to normocytic anemia.  Given only mild, chronic anemia, no acute hematologic intervention is needed at current values.  Will continue to monitor for now.  Clinical picture is not concerning for monoclonal gammopathy or other ominous etiologies.  Patient was provided reassurance.  Plan to pursue additional workup in January 2025 on return visit, to rule out monoclonal gammopathy, copper deficiency or other etiologies.  REVIEW OF SYSTEMS:    Review of Systems - Oncology  All other pertinent systems were reviewed with the patient and are negative.  I have reviewed the past medical history, past surgical history, social history and family history with the patient and they are unchanged from previous note.  ALLERGIES:  She is allergic to codeine and other.  MEDICATIONS:  Current Outpatient Medications  Medication Sig Dispense Refill   acetaminophen (TYLENOL) 650 MG CR tablet Take 1,300 mg by mouth every 8 (eight) hours as needed for pain.     atorvastatin (LIPITOR) 20 MG tablet Take 20 mg by mouth daily.     cetirizine (ZYRTEC) 10 MG tablet Take 10 mg by mouth daily as needed for allergies.      Cholecalciferol (DIALYVITE VITAMIN D 5000) 125 MCG (5000 UT)  capsule Take 15,000 Units by mouth once a week.     insulin glargine (LANTUS) 100 UNIT/ML injection Inject 20 Units into the skin daily.     insulin lispro (HUMALOG) 100 UNIT/ML injection HumaLOG     omeprazole (PRILOSEC) 20 MG capsule Take 20 mg by mouth daily.     No current facility-administered medications for this visit.    PHYSICAL EXAMINATION: ECOG PERFORMANCE STATUS: 1 - Symptomatic but completely ambulatory  LABORATORY DATA:   I have reviewed the data as listed   Recent Results (from the past 2160 hour(s))  Reticulocytes     Status: Abnormal   Collection Time: 06/01/23  9:35 AM  Result Value Ref Range   Retic Ct Pct 1.0 0.4 - 3.1 %   RBC. 3.40 (L) 3.87 - 5.11 MIL/uL    Retic Count, Absolute 34.0 19.0 - 186.0 K/uL   Immature Retic Fract 11.7 2.3 - 15.9 %    Comment: Performed at [facility], [address] [place], [place] [address]  Vitamin B12     Status: None   Collection Time: 06/01/23  9:35 AM  Result Value Ref Range   Vitamin B-12 365 180 - 914 pg/mL    Comment: (NOTE) This assay is not validated for testing neonatal or myeloproliferative syndrome specimens for Vitamin B12 levels. Performed at [facility], [address], [place], [place] [address]   TSH     Status: None   Collection Time: 06/01/23  9:35 AM  Result Value Ref Range   TSH 0.663 0.350 - 4.500 uIU/mL    Comment: Performed by a 3rd Generation assay with a functional sensitivity of <=0.01 uIU/mL. Performed at [facility], [address], [place], [place] [address]   Lactate dehydrogenase     Status: None   Collection Time: 06/01/23  9:35 AM  Result Value Ref Range   LDH 180 98 - 192 U/L    Comment: Performed at [facility], [address], [place], [place] [address]  Iron and Iron Binding Capacity ([facility] only)     Status: None   Collection Time: 06/01/23  9:35 AM  Result Value Ref Range   Iron 70 28 - 170 ug/dL   TIBC 295 284 - 132 ug/dL   Saturation Ratios 19 10.4 - 31.8 %   UIBC 297 ug/dL    Comment: Performed at [facility], [address] [place], [place] [address]  Haptoglobin     Status: None   Collection Time: 06/01/23  9:35 AM  Result Value Ref Range   Haptoglobin 290 37 - 355 mg/dL    Comment: (NOTE) Performed At: [facility] [address] [place], [place] [id number] Jolene Schimke MD IH:[id number]   Folate     Status: None   Collection Time: 06/01/23  9:35 AM  Result Value Ref Range   Folate 13.6 >5.9 ng/mL    Comment: Performed at [facility], [address], [place], [place] [address]  Comprehensive metabolic panel     Status: Abnormal   Collection Time:  06/01/23  9:35 AM  Result Value Ref Range   Sodium 140 135 - 145 mmol/L   Potassium 3.8 3.5 - 5.1 mmol/L   Chloride 104 98 - 111 mmol/L   CO2 30 22 - 32 mmol/L   Glucose, Bld 99 70 - 99 mg/dL    Comment: Glucose reference range applies only to samples taken after fasting for at least 8 hours.  BUN 21 8 - 23 mg/dL   Creatinine, Ser 4.09 0.44 - 1.00 mg/dL   Calcium 81.1 8.9 - 91.4 mg/dL   Total Protein 7.0 6.5 - 8.1 g/dL   Albumin 4.3 3.5 - 5.0 g/dL   AST 12 (L) 15 - 41 U/L   ALT 8 0 - 44 U/L   Alkaline Phosphatase 98 38 - 126 U/L   Total Bilirubin 0.5 0.3 - 1.2 mg/dL   GFR, Estimated >78 >29 mL/min    Comment: (NOTE) Calculated using the CKD-EPI Creatinine Equation (2021)    Anion gap 6 5 - 15    Comment: Performed at [facility], [address], [place], [place] [address]  CBC with Differential/Platelet     Status: Abnormal   Collection Time: 06/01/23  9:35 AM  Result Value Ref Range   WBC 10.3 4.0 - 10.5 K/uL   RBC 3.36 (L) 3.87 - 5.11 MIL/uL   Hemoglobin 10.4 (L) 12.0 - 15.0 g/dL   HCT 08.6 (L) 57.8 - 46.9 %   MCV 92.6 80.0 - 100.0 fL   MCH 31.0 26.0 - 34.0 pg   MCHC 33.4 30.0 - 36.0 g/dL   RDW 62.9 52.8 - 41.3 %   Platelets 342 150 - 400 K/uL   nRBC 0.0 0.0 - 0.2 %   Neutrophils Relative % 71 %   Neutro Abs 7.4 1.7 - 7.7 K/uL   Lymphocytes Relative 18 %   Lymphs Abs 1.8 0.7 - 4.0 K/uL   Monocytes Relative 7 %   Monocytes Absolute 0.8 0.1 - 1.0 K/uL   Eosinophils Relative 2 %   Eosinophils Absolute 0.2 0.0 - 0.5 K/uL   Basophils Relative 1 %   Basophils Absolute 0.1 0.0 - 0.1 K/uL   Immature Granulocytes 1 %   Abs Immature Granulocytes 0.10 (H) 0.00 - 0.07 K/uL    Comment: Performed at [facility], [address] [place], [place] [address]  Ferritin     Status: None   Collection Time: 06/01/23  9:36 AM  Result Value Ref Range   Ferritin 26 11 - 307 ng/mL    Comment: Performed at [facility], [address] [place], [place] [address]  Direct antiglobulin test (not at [facility])     Status: None   Collection Time: 06/01/23  9:36 AM  Result Value Ref Range   DAT, complement NEG    DAT, IgG      NEG Performed at [facility], [address] [place], [place] [address]      I discussed the assessment and treatment plan with the patient. The patient was provided an opportunity to ask questions and all were answered. The patient agreed with the plan and demonstrated an understanding of the instructions. The patient was advised to call back or seek an in-person evaluation if the symptoms worsen or if the condition fails to improve as anticipated.    I spent 25 minutes for the appointment reviewing test results, discuss management and coordination of care.  Meryl Crutch, MD 06/23/2023 11:23 AM [facility] - A DEPT OF [facility] [address] [place] [address] Dept: [phone] Dept Fax: [phone]

## 2023-06-23 NOTE — Assessment & Plan Note (Signed)
-  Remains in remission clinically.  No concerning symptoms clinically.  No concerning lymphadenopathy or hepatosplenomegaly on exam. -No indication for routine imaging.

## 2023-06-23 NOTE — Telephone Encounter (Signed)
Spoke with patient confirming upcoming appointment  

## 2023-06-23 NOTE — Assessment & Plan Note (Signed)
-  Mammogram up to date as of May 2024. -Due for colonoscopy in 2026.

## 2023-06-23 NOTE — Assessment & Plan Note (Addendum)
-   On her initial consultation with [place] on 06/01/2023, labs showed hemoglobin of 10.4, hematocrit 31.1, MCV 92.6.  White count was 10,300 with normal differential.  Platelet count was normal at 342,000.  Reticulocyte count was normal.  Iron studies, B12, folate, TSH, LDH, haptoglobin were all within normal limits.  CMP unremarkable.  Coombs test negative.  History of non-Hodgkin's lymphoma treated with chemotherapy and autologous stem cell transplant. ?  Could be contributing to normocytic anemia.  Given only mild, chronic anemia, no acute hematologic intervention is needed at current values.  Will continue to monitor for now.  Clinical picture is not concerning for monoclonal gammopathy or other ominous etiologies.  Patient was provided reassurance.  No symptoms of anemia reported. No evidence of bleeding.  OTC oral iron supplementation started in October 2024.  Plan to pursue additional workup in January 2025 on return visit, to rule out monoclonal gammopathy, copper deficiency or other etiologies.  Will arrange for lab appointment 2 weeks prior to return visit so that all the results will be available to discuss, when we see her.

## 2023-07-13 ENCOUNTER — Encounter: Payer: Medicare Other | Admitting: Nurse Practitioner

## 2023-07-13 ENCOUNTER — Inpatient Hospital Stay: Payer: Medicare Other

## 2023-08-18 ENCOUNTER — Other Ambulatory Visit: Payer: Medicare Other

## 2023-08-18 ENCOUNTER — Inpatient Hospital Stay: Payer: Medicare Other | Attending: Oncology

## 2023-08-18 DIAGNOSIS — D649 Anemia, unspecified: Secondary | ICD-10-CM | POA: Insufficient documentation

## 2023-08-18 DIAGNOSIS — Z8719 Personal history of other diseases of the digestive system: Secondary | ICD-10-CM | POA: Diagnosis not present

## 2023-08-18 DIAGNOSIS — Z9221 Personal history of antineoplastic chemotherapy: Secondary | ICD-10-CM | POA: Diagnosis not present

## 2023-08-18 DIAGNOSIS — Z8572 Personal history of non-Hodgkin lymphomas: Secondary | ICD-10-CM | POA: Insufficient documentation

## 2023-08-18 DIAGNOSIS — M1990 Unspecified osteoarthritis, unspecified site: Secondary | ICD-10-CM | POA: Insufficient documentation

## 2023-08-18 DIAGNOSIS — Z79899 Other long term (current) drug therapy: Secondary | ICD-10-CM | POA: Diagnosis not present

## 2023-08-18 DIAGNOSIS — Z9484 Stem cells transplant status: Secondary | ICD-10-CM | POA: Insufficient documentation

## 2023-08-18 DIAGNOSIS — Z87891 Personal history of nicotine dependence: Secondary | ICD-10-CM | POA: Insufficient documentation

## 2023-08-18 LAB — IRON AND TIBC
Iron: 91 ug/dL (ref 28–170)
Saturation Ratios: 22 % (ref 10.4–31.8)
TIBC: 420 ug/dL (ref 250–450)
UIBC: 329 ug/dL

## 2023-08-18 LAB — CBC WITH DIFFERENTIAL/PLATELET
Abs Immature Granulocytes: 0.05 10*3/uL (ref 0.00–0.07)
Basophils Absolute: 0.1 10*3/uL (ref 0.0–0.1)
Basophils Relative: 1 %
Eosinophils Absolute: 0.3 10*3/uL (ref 0.0–0.5)
Eosinophils Relative: 3 %
HCT: 32.5 % — ABNORMAL LOW (ref 36.0–46.0)
Hemoglobin: 10.7 g/dL — ABNORMAL LOW (ref 12.0–15.0)
Immature Granulocytes: 0 %
Lymphocytes Relative: 18 %
Lymphs Abs: 2 10*3/uL (ref 0.7–4.0)
MCH: 30.5 pg (ref 26.0–34.0)
MCHC: 32.9 g/dL (ref 30.0–36.0)
MCV: 92.6 fL (ref 80.0–100.0)
Monocytes Absolute: 0.7 10*3/uL (ref 0.1–1.0)
Monocytes Relative: 6 %
Neutro Abs: 8.1 10*3/uL — ABNORMAL HIGH (ref 1.7–7.7)
Neutrophils Relative %: 72 %
Platelets: 379 10*3/uL (ref 150–400)
RBC: 3.51 MIL/uL — ABNORMAL LOW (ref 3.87–5.11)
RDW: 14.1 % (ref 11.5–15.5)
WBC: 11.2 10*3/uL — ABNORMAL HIGH (ref 4.0–10.5)
nRBC: 0 % (ref 0.0–0.2)

## 2023-08-18 LAB — COMPREHENSIVE METABOLIC PANEL
ALT: 9 U/L (ref 0–44)
AST: 15 U/L (ref 15–41)
Albumin: 4.6 g/dL (ref 3.5–5.0)
Alkaline Phosphatase: 141 U/L — ABNORMAL HIGH (ref 38–126)
Anion gap: 8 (ref 5–15)
BUN: 20 mg/dL (ref 8–23)
CO2: 29 mmol/L (ref 22–32)
Calcium: 9.7 mg/dL (ref 8.9–10.3)
Chloride: 103 mmol/L (ref 98–111)
Creatinine, Ser: 0.69 mg/dL (ref 0.44–1.00)
GFR, Estimated: >60 mL/min (ref >60)
Glucose, Bld: 165 mg/dL — ABNORMAL HIGH (ref 70–99)
Potassium: 3.7 mmol/L (ref 3.5–5.1)
Sodium: 140 mmol/L (ref 135–145)
Total Bilirubin: 0.5 mg/dL (ref 0.0–1.2)
Total Protein: 7.4 g/dL (ref 6.5–8.1)

## 2023-08-18 LAB — LACTATE DEHYDROGENASE: LDH: 244 U/L — ABNORMAL HIGH (ref 98–192)

## 2023-08-18 LAB — FERRITIN: Ferritin: 16 ng/mL (ref 11–307)

## 2023-08-19 LAB — KAPPA/LAMBDA LIGHT CHAINS
Kappa free light chain: 13.5 mg/L (ref 3.3–19.4)
Kappa, lambda light chain ratio: 0.99 (ref 0.26–1.65)
Lambda free light chains: 13.7 mg/L (ref 5.7–26.3)

## 2023-08-20 LAB — COPPER, SERUM: Copper: 108 ug/dL (ref 80–158)

## 2023-08-20 LAB — METHYLMALONIC ACID, SERUM: Methylmalonic Acid, Quantitative: 124 nmol/L (ref 0–378)

## 2023-08-27 LAB — MULTIPLE MYELOMA PANEL, SERUM
Albumin SerPl Elph-Mcnc: 3.7 g/dL (ref 2.9–4.4)
Albumin/Glob SerPl: 1.3 (ref 0.7–1.7)
Alpha 1: 0.3 g/dL (ref 0.0–0.4)
Alpha2 Glob SerPl Elph-Mcnc: 1 g/dL (ref 0.4–1.0)
B-Globulin SerPl Elph-Mcnc: 1 g/dL (ref 0.7–1.3)
Gamma Glob SerPl Elph-Mcnc: 0.7 g/dL (ref 0.4–1.8)
Globulin, Total: 3 g/dL (ref 2.2–3.9)
IgA: 85 mg/dL — ABNORMAL LOW (ref 87–352)
IgG (Immunoglobin G), Serum: 797 mg/dL (ref 586–1602)
IgM (Immunoglobulin M), Srm: 84 mg/dL (ref 26–217)
Total Protein ELP: 6.7 g/dL (ref 6.0–8.5)

## 2023-09-01 ENCOUNTER — Inpatient Hospital Stay (HOSPITAL_BASED_OUTPATIENT_CLINIC_OR_DEPARTMENT_OTHER): Payer: Medicare Other | Admitting: Oncology

## 2023-09-01 ENCOUNTER — Other Ambulatory Visit: Payer: Medicare Other

## 2023-09-01 ENCOUNTER — Encounter: Payer: Self-pay | Admitting: Oncology

## 2023-09-01 VITALS — BP 118/60 | HR 78 | Temp 97.9°F | Resp 18 | Ht 64.0 in | Wt 161.2 lb

## 2023-09-01 DIAGNOSIS — Z8572 Personal history of non-Hodgkin lymphomas: Secondary | ICD-10-CM

## 2023-09-01 DIAGNOSIS — M1990 Unspecified osteoarthritis, unspecified site: Secondary | ICD-10-CM | POA: Diagnosis not present

## 2023-09-01 DIAGNOSIS — K220 Achalasia of cardia: Secondary | ICD-10-CM | POA: Diagnosis not present

## 2023-09-01 DIAGNOSIS — D649 Anemia, unspecified: Secondary | ICD-10-CM

## 2023-09-01 DIAGNOSIS — Z0000 Encounter for general adult medical examination without abnormal findings: Secondary | ICD-10-CM

## 2023-09-01 NOTE — Assessment & Plan Note (Signed)
-  Mammogram up to date as of May 2024. -Due for colonoscopy in 2026.

## 2023-09-01 NOTE — Assessment & Plan Note (Signed)
History of achalasia with esophageal damage. Recent endoscopy with attempted placement of capsule to measure acid reflux levels. -Continue follow-up with her gastroenterologist

## 2023-09-01 NOTE — Assessment & Plan Note (Signed)
-  Remains in remission clinically.  No concerning symptoms clinically.  No concerning lymphadenopathy or hepatosplenomegaly on exam. -No indication for routine imaging.

## 2023-09-01 NOTE — Progress Notes (Signed)
[facility]  HEMATOLOGY CLINIC PROGRESS NOTE  PATIENT NAME: Melinda Drake   MR#: [id number] DOB: [birth year]  Patient Care Team: Darrow Bussing, MD as PCP - General (Family Medicine) Karie Soda, MD as Consulting Physician (General Surgery) Vida Rigger, MD as Consulting Physician (Gastroenterology) Maeola Harman, MD as Consulting Physician (Neurosurgery)  Date of visit: 09/01/2023   ASSESSMENT & PLAN:   Melinda Drake is a 68 y.o. lady with a past medical history of Non-Hodgkin's Lymphoma diagnosed in 1996, treated with chemo followed by autologous stem cell transplant in 1997, in remission, dyslipidemia, diabetes mellitus, achalasia cardia, arthritis, was referred to our service in October 2024 for evaluation of normocytic anemia.  Grossly negative hematological workup.  Normocytic normochromic anemia - On her initial consultation with [place] on 06/01/2023, labs showed hemoglobin of 10.4, hematocrit 31.1, MCV 92.6.  White count was 10,300 with normal differential.  Platelet count was normal at 342,000.  Reticulocyte count was normal.  Iron studies, B12, folate, TSH, LDH, haptoglobin were all within normal limits.  CMP unremarkable.  Coombs test negative.  History of non-Hodgkin's lymphoma treated with chemotherapy and autologous stem cell transplant. ?  Could be contributing to normocytic anemia.  On 08/18/2023, we pursued additional workup for anemia.  Hemoglobin was stable at 10.7.  SPEP showed no evidence of M spike.  Serum free light chains were within normal limits.  No evidence of monoclonal gammopathy.  Serum copper, methylmalonic acid, repeat iron studies were all within normal limits.  Given only mild, chronic anemia, no acute hematologic intervention is needed at current values.  Will continue to monitor for now.  Clinical picture is not concerning for monoclonal gammopathy or other ominous etiologies.  Patient was provided reassurance.  No symptoms of  anemia reported. No evidence of bleeding.  OTC oral iron supplementation started in October 2024.  RTC in 6 months for follow-up with repeat labs.  Hx of non-Hodgkin's lymphoma -Remains in remission clinically.  No concerning symptoms clinically.  No concerning lymphadenopathy or hepatosplenomegaly on exam. -No indication for routine imaging.  Achalasia s/p Heller myotomy/Toupet 05/03/2015 History of achalasia with esophageal damage. Recent endoscopy with attempted placement of capsule to measure acid reflux levels. -Continue follow-up with her gastroenterologist  Arthritis History of back surgery in 2021. Current pain managed with ibuprofen 800mg  and gabapentin. -Continue current management.  Healthcare maintenance -Mammogram up to date as of May 2024. -Due for colonoscopy in 2026.   I spent a total of 30 minutes during this encounter with the patient including review of chart and various tests results, discussions about plan of care and coordination of care plan.  I reviewed lab results and outside records for this visit and discussed relevant results with the patient. Diagnosis, plan of care and treatment options were also discussed in detail with the patient. Opportunity provided to ask questions and answers provided to her apparent satisfaction. Provided instructions to call our clinic with any problems, questions or concerns prior to return visit. I recommended to continue follow-up with PCP and sub-specialists. She verbalized understanding and agreed with the plan. No barriers to learning was detected.  Melinda Crutch, MD  09/01/2023 12:08 PM  [facility] - A DE[facility] OF [facility] [address] [place] [address] Dept: [phone] Dept Fax: [phone]   CHIEF COMPLAINT/ REASON FOR VISIT:  Follow-up for normocytic anemia, remote history of non-Hodgkin's lymphoma in 1996.  INTERVAL  HISTORY:  Discussed  the use of AI scribe software for clinical note transcription with the patient, who gave verbal consent to proceed.   Patient returns for follow-up today.  She has been experiencing chronic anemia, with her red blood cell count consistently hovering around 10, slightly below the normal range of 12. The patient has been taking iron supplements twice a week to manage this condition, but notes that the medication affects her bowel movements. She also reports joint pain and arthritis, for which she takes ibuprofen and gabapentin. The patient has not been informed of any fatty liver or fat deposition in the liver. She is due for a colonoscopy next year.  SUMMARY OF HEMATOLOGIC HISTORY:  68 y.o. lady with a past medical history of Non-Hodgkin's Lymphoma diagnosed in 1996, treated with chemo followed by autologous stem cell transplant in 1997, in remission, dyslipidemia, diabetes mellitus, achalasia cardia, arthritis, was referred to our service in October 2024 for evaluation of normocytic anemia.     Hemoglobin was 10.3 in March 2024, later 9.8, 10.7, 9.6 on recheck in September 2024. Because of persistent anemia, in the context of her PMHx, referral was sent to [place] for further evaluation of anemia.   The patient had peroral endoscopic myotomy (POEM) for achalasia cardia in January 2024 at [facility] by Dr. Marcheta Grammes.    She had not noticed any recent bleeding such as epistaxis, hematuria or hematochezia.   Her last colonoscopy was 2016.   Her age appropriate screening programs are up-to-date.   She denies any pica and eats a variety of diet.   She takes OTC oral iron supplements and she takes 1 tablet twice a week.    On her initial consultation with [place] on 06/01/2023, labs showed hemoglobin of 10.4, hematocrit 31.1, MCV 92.6.  White count was 10,300 with normal differential.  Platelet count was normal at 342,000.  Reticulocyte count was normal.  Iron studies, B12, folate,  TSH, LDH, haptoglobin were all within normal limits.  CMP unremarkable.  Coombs test negative.   History of non-Hodgkin's lymphoma treated with chemotherapy and autologous stem cell transplant. ?  Could be contributing to normocytic anemia.    On 08/18/2023, we pursued additional workup for anemia.  SPEP showed no evidence of M spike.  Serum free light chains were within normal limits.  No evidence of monoclonal gammopathy.  Serum copper, methylmalonic acid, repeat iron studies were all within normal limits.  Given only mild, chronic anemia, no acute hematologic intervention is needed at current values.  Will continue to monitor for now.  Clinical picture is not concerning for monoclonal gammopathy or other ominous etiologies.  Patient was provided reassurance.  I have reviewed the past medical history, past surgical history, social history and family history with the patient and they are unchanged from previous note.  ALLERGIES: She is allergic to codeine and other.  MEDICATIONS:  Current Outpatient Medications  Medication Sig Dispense Refill   atorvastatin (LIPITOR) 20 MG tablet Take 20 mg by mouth daily.     Cholecalciferol (DIALYVITE VITAMIN D 5000) 125 MCG (5000 UT) capsule Take 15,000 Units by mouth once a week.     gabapentin (NEURONTIN) 300 MG capsule Take 300 mg by mouth 3 (three) times daily.     ibuprofen (ADVIL) 800 MG tablet Take 800 mg by mouth 3 (three) times daily as needed.     insulin glargine (LANTUS) 100 UNIT/ML injection Inject 20 Units into the skin daily.     insulin lispro (HUMALOG) 100 UNIT/ML injection HumaLOG  omeprazole (PRILOSEC) 20 MG capsule Take 20 mg by mouth daily.     acetaminophen (TYLENOL) 650 MG CR tablet Take 1,300 mg by mouth every 8 (eight) hours as needed for pain. (Patient not taking: Reported on 09/01/2023)     cetirizine (ZYRTEC) 10 MG tablet Take 10 mg by mouth daily as needed for allergies.  (Patient not taking: Reported on 09/01/2023)     No  current facility-administered medications for this visit.     REVIEW OF SYSTEMS:    ROS  All other pertinent systems were reviewed with the patient and are negative.  PHYSICAL EXAMINATION:  ECOG PERFORMANCE STATUS: 1 - Symptomatic but completely ambulatory  Vitals:   09/01/23 1112 09/01/23 1119  BP: (!) 116/54 118/60  Pulse: 78   Resp: 18   Temp: 97.9 F (36.6 C)   SpO2: 100%    Filed Weights   09/01/23 1112  Weight: 161 lb 3.2 oz (73.1 kg)    Physical Exam Constitutional:      General: She is not in acute distress.    Appearance: Normal appearance.  HENT:     Head: Normocephalic and atraumatic.  Eyes:     General: No scleral icterus.    Conjunctiva/sclera: Conjunctivae normal.  Cardiovascular:     Rate and Rhythm: Normal rate and regular rhythm.     Heart sounds: Normal heart sounds.  Pulmonary:     Effort: Pulmonary effort is normal.     Breath sounds: Normal breath sounds.  Abdominal:     General: There is no distension.  Musculoskeletal:     Right lower leg: No edema.     Left lower leg: No edema.  Neurological:     General: No focal deficit present.     Mental Status: She is alert and oriented to person, place, and time.  Psychiatric:        Mood and Affect: Mood normal.        Behavior: Behavior normal.        Thought Content: Thought content normal.     LABORATORY DATA:   I have reviewed the data as listed.  Recent Results (from the past 2160 hours)  Kappa/lambda light chains     Status: None   Collection Time: 08/18/23  9:04 AM  Result Value Ref Range   Kappa free light chain 13.5 3.3 - 19.4 mg/L   Lambda free light chains 13.7 5.7 - 26.3 mg/L   Kappa, lambda light chain ratio 0.99 0.26 - 1.65    Comment: (NOTE) Performed At: [facility] [address] [place], [place] [id number] Jolene Schimke MD [id number]   Methylmalonic acid, serum     Status: None   Collection Time: 08/18/23  9:04 AM  Result Value Ref Range    Methylmalonic Acid, Quantitative 124 0 - 378 nmol/L    Comment: (NOTE) This test was developed and its performance characteristics determined by [facility]. It has not been cleared or approved by the Food and Drug Administration. Performed At: [facility] [address] [place], [place] [id number] Jolene Schimke MD WN:[id number]   Copper, serum     Status: None   Collection Time: 08/18/23  9:04 AM  Result Value Ref Range   Copper 108 80 - 158 ug/dL    Comment: (NOTE) This test was developed and its performance characteristics determined by [facility]. It has not been cleared or approved by the Food and Drug Administration.  Detection Limit = 5 Performed At: [facility] [address] [place], [place] [id number] Jolene Schimke MD WU:[id number]   Lactate dehydrogenase     Status: Abnormal   Collection Time: 08/18/23  9:04 AM  Result Value Ref Range   LDH 244 (H) 98 - 192 U/L    Comment: Performed at [facility], [address] [place], [place] [address]  Comprehensive metabolic panel     Status: Abnormal   Collection Time: 08/18/23  9:04 AM  Result Value Ref Range   Sodium 140 135 - 145 mmol/L   Potassium 3.7 3.5 - 5.1 mmol/L   Chloride 103 98 - 111 mmol/L   CO2 29 22 - 32 mmol/L   Glucose, Bld 165 (H) 70 - 99 mg/dL    Comment: Glucose reference range applies only to samples taken after fasting for at least 8 hours.   BUN 20 8 - 23 mg/dL   Creatinine, Ser 3.08 0.44 - 1.00 mg/dL   Calcium 9.7 8.9 - 65.7 mg/dL   Total Protein 7.4 6.5 - 8.1 g/dL   Albumin 4.6 3.5 - 5.0 g/dL   AST 15 15 - 41 U/L   ALT 9 0 - 44 U/L   Alkaline Phosphatase 141 (H) 38 - 126 U/L   Total Bilirubin 0.5 0.0 - 1.2 mg/dL   GFR, Estimated >84 >69 mL/min    Comment: (NOTE) Calculated using the CKD-EPI Creatinine Equation (2021)    Anion gap 8 5 - 15    Comment: Performed at [facility], [address]  [place], [place] [address]  CBC with Differential/Platelet     Status: Abnormal   Collection Time: 08/18/23  9:04 AM  Result Value Ref Range   WBC 11.2 (H) 4.0 - 10.5 K/uL   RBC 3.51 (L) 3.87 - 5.11 MIL/uL   Hemoglobin 10.7 (L) 12.0 - 15.0 g/dL   HCT 84.1 (L) 32.4 - 40.1 %   MCV 92.6 80.0 - 100.0 fL   MCH 30.5 26.0 - 34.0 pg   MCHC 32.9 30.0 - 36.0 g/dL   RDW 02.7 25.3 - 66.4 %   Platelets 379 150 - 400 K/uL   nRBC 0.0 0.0 - 0.2 %   Neutrophils Relative % 72 %   Neutro Abs 8.1 (H) 1.7 - 7.7 K/uL   Lymphocytes Relative 18 %   Lymphs Abs 2.0 0.7 - 4.0 K/uL   Monocytes Relative 6 %   Monocytes Absolute 0.7 0.1 - 1.0 K/uL   Eosinophils Relative 3 %   Eosinophils Absolute 0.3 0.0 - 0.5 K/uL   Basophils Relative 1 %   Basophils Absolute 0.1 0.0 - 0.1 K/uL   Immature Granulocytes 0 %   Abs Immature Granulocytes 0.05 0.00 - 0.07 K/uL    Comment: Performed at [facility], [address] [place], [place] [address]  Iron and TIBC     Status: None   Collection Time: 08/18/23  9:04 AM  Result Value Ref Range   Iron 91 28 - 170 ug/dL   TIBC 425 956 - 387 ug/dL   Saturation Ratios 22 10.4 - 31.8 %   UIBC 329 ug/dL    Comment: Performed at [facility], [address], [place], [place] [address]  Ferritin     Status: None   Collection Time: 08/18/23  9:05 AM  Result Value Ref Range   Ferritin 16 11 - 307 ng/mL    Comment: Performed at [facility], [address]  [place], [place], [place] [address]  Multiple Myeloma Panel (SPEP&IFE w/QIG)     Status: Abnormal   Collection Time: 08/18/23  9:05 AM  Result Value Ref Range   IgG (Immunoglobin G), Serum 797 586 - 1,602 mg/dL   IgA 85 (L) 87 - 604 mg/dL   IgM (Immunoglobulin M), Srm 84 26 - 217 mg/dL   Total Protein ELP 6.7 6.0 - 8.5 g/dL   Albumin SerPl Elph-Mcnc 3.7 2.9 - 4.4 g/dL   Alpha 1 0.3 0.0 - 0.4 g/dL   Alpha2 Glob SerPl Elph-Mcnc 1.0 0.4 - 1.0 g/dL   B-Globulin SerPl Elph-Mcnc 1.0 0.7 - 1.3  g/dL   Gamma Glob SerPl Elph-Mcnc 0.7 0.4 - 1.8 g/dL   M Protein SerPl Elph-Mcnc Not Observed Not Observed g/dL   Globulin, Total 3.0 2.2 - 3.9 g/dL   Albumin/Glob SerPl 1.3 0.7 - 1.7   IFE 1 Comment     Comment: (NOTE) The immunofixation pattern appears unremarkable. Evidence of monoclonal protein is not apparent.    Please Note Comment     Comment: (NOTE) Protein electrophoresis scan will follow via computer, mail, or courier delivery. Performed At: [facility] [address] [place], [place] [id number] Jolene Schimke MD YN:[id number]        RADIOGRAPHIC STUDIES:  No recent pertinent imaging studies available to review.  Orders Placed This Encounter  Procedures   CBC with Differential/Platelet    Standing Status:   Future    Expected Date:   02/29/2024    Expiration Date:   08/31/2024   Comprehensive metabolic panel    Standing Status:   Future    Expected Date:   02/29/2024    Expiration Date:   08/31/2024   Iron and TIBC    Standing Status:   Future    Expected Date:   02/29/2024    Expiration Date:   08/31/2024   Ferritin    Standing Status:   Future    Expected Date:   02/29/2024    Expiration Date:   08/31/2024   Lactate dehydrogenase    Standing Status:   Future    Expected Date:   02/29/2024    Expiration Date:   08/31/2024   Haptoglobin    Standing Status:   Future    Expected Date:   02/29/2024    Expiration Date:   08/31/2024     Future Appointments  Date Time Provider Department Center  02/29/2024 11:00 AM [facility] PHLEBOTOMIST [facility] None  02/29/2024 11:20 AM Keagan Brislin, Archie Patten, MD [facility] None     This document was completed utilizing speech recognition software. Grammatical errors, random word insertions, pronoun errors, and incomplete sentences are an occasional consequence of this system due to software limitations, ambient noise, and hardware issues. Any formal questions or concerns about the content, text or information contained within  the body of this dictation should be directly addressed to the provider for clarification.

## 2023-09-01 NOTE — Assessment & Plan Note (Signed)
-   On her initial consultation with [place] on 06/01/2023, labs showed hemoglobin of 10.4, hematocrit 31.1, MCV 92.6.  White count was 10,300 with normal differential.  Platelet count was normal at 342,000.  Reticulocyte count was normal.  Iron studies, B12, folate, TSH, LDH, haptoglobin were all within normal limits.  CMP unremarkable.  Coombs test negative.  History of non-Hodgkin's lymphoma treated with chemotherapy and autologous stem cell transplant. ?  Could be contributing to normocytic anemia.  On 08/18/2023, we pursued additional workup for anemia.  Hemoglobin was stable at 10.7.  SPEP showed no evidence of M spike.  Serum free light chains were within normal limits.  No evidence of monoclonal gammopathy.  Serum copper, methylmalonic acid, repeat iron studies were all within normal limits.  Given only mild, chronic anemia, no acute hematologic intervention is needed at current values.  Will continue to monitor for now.  Clinical picture is not concerning for monoclonal gammopathy or other ominous etiologies.  Patient was provided reassurance.  No symptoms of anemia reported. No evidence of bleeding.  OTC oral iron supplementation started in October 2024.  RTC in 6 months for follow-up with repeat labs.

## 2023-09-01 NOTE — Assessment & Plan Note (Signed)
History of back surgery in 2021. Current pain managed with ibuprofen 800mg  and gabapentin. -Continue current management.

## 2023-10-25 IMAGING — RF DG ESOPHAGUS
7 of 11 series · 13 of 21 positions shown · non-contrast
Comparison: Prior esophagram is a 07/11/2019 and earlier.
COMPARISON: Prior esophagram is a 07/11/2019 and earlier.

Addendum:
CLINICAL DATA: Provided history: Achalasia of esophagus. Esophageal
dysphagia. Additional history provided: Patient reports feeling as
though food is becoming stuck in the region of the mid chest.
Symptoms since 3668. Prior botox injections May 2021. Prior
surgery.

EXAM:
ESOPHOGRAM/BARIUM SWALLOW
TECHNIQUE: Combined double contrast and single contrast examination performed
using effervescent crystals, thick barium liquid, and thin barium
liquid.
FLUOROSCOPY:
Fluorsocopy time: 54 seconds
Radiation Exposure Index (as provided by the fluoroscopic device):
4.9 mGy

[Series 1: cp_standard · 2 of 169 frames shown (1 of 6)]
[frame 14/169]
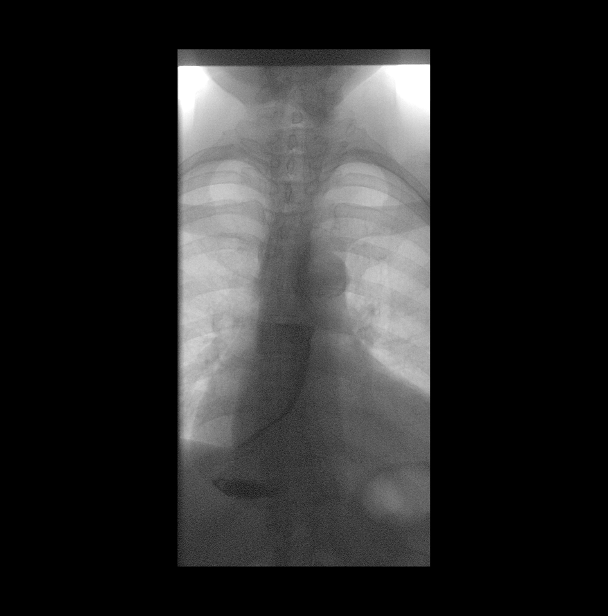
[frame 85/169]
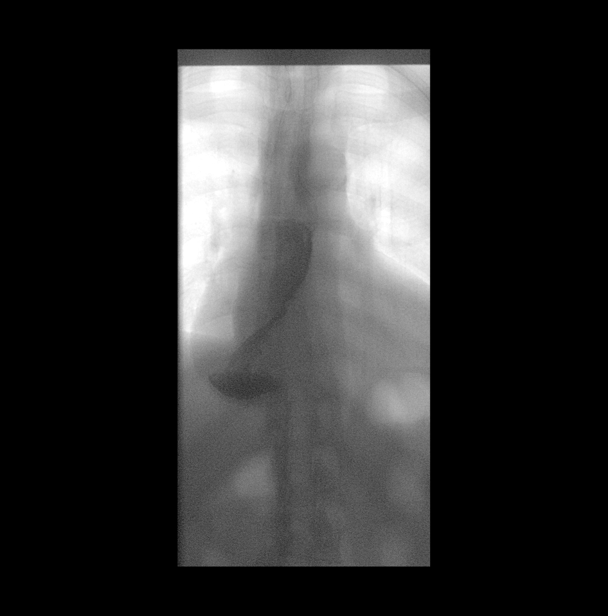

[Series 2: cp_standard · 3 of 95 frames shown (2 of 6)]
[frame 5/95]
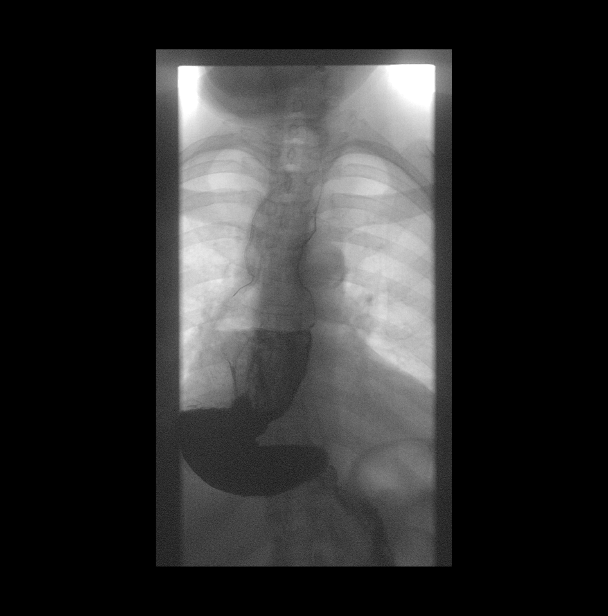
[frame 15/95]
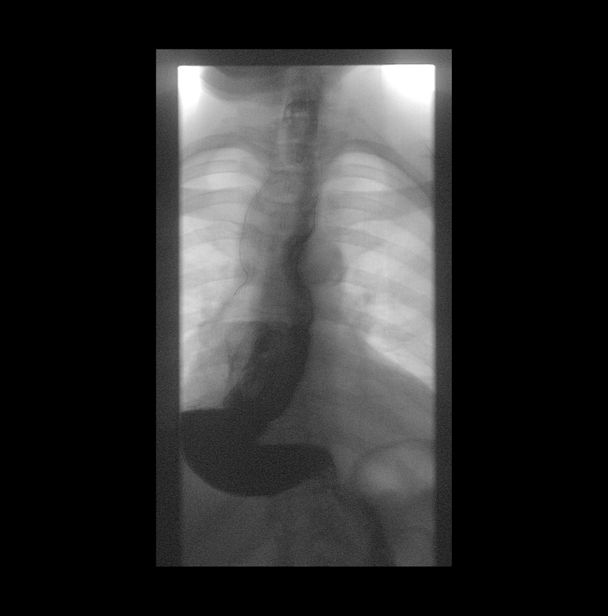
[frame 81/95]
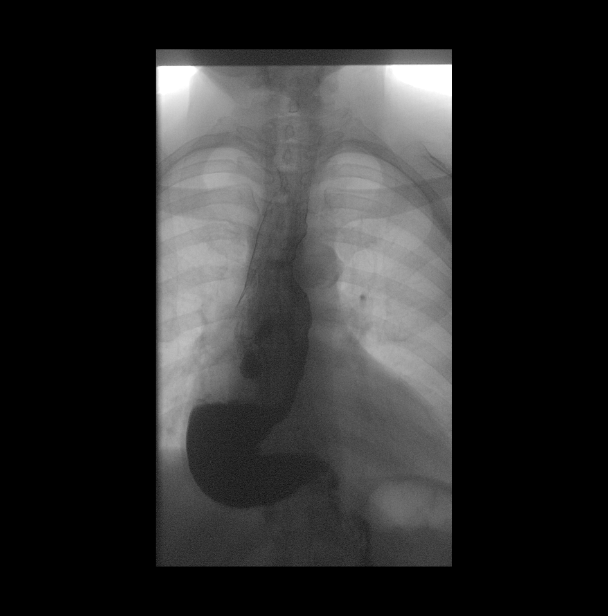

[Series 3: cp_standard · 1 of 1 slices shown (3 of 6)]
[im 1/1]
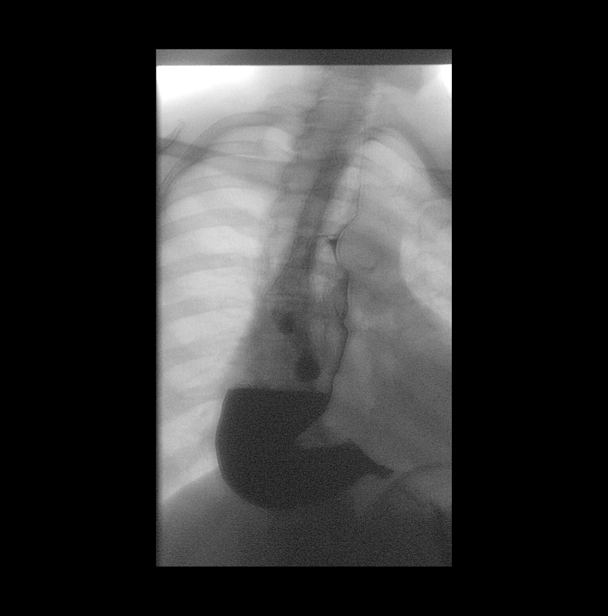

[Series 5: cp_standard · 1 of 1 slices shown (4 of 6)]
[im 1/1]
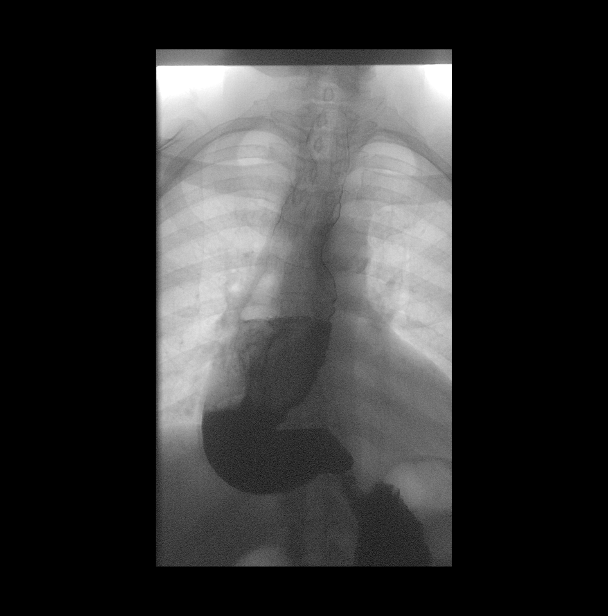

[Series 7: fluoro_barium 2fps_bw · 2 of 2 frames shown]
[frame 1/2]
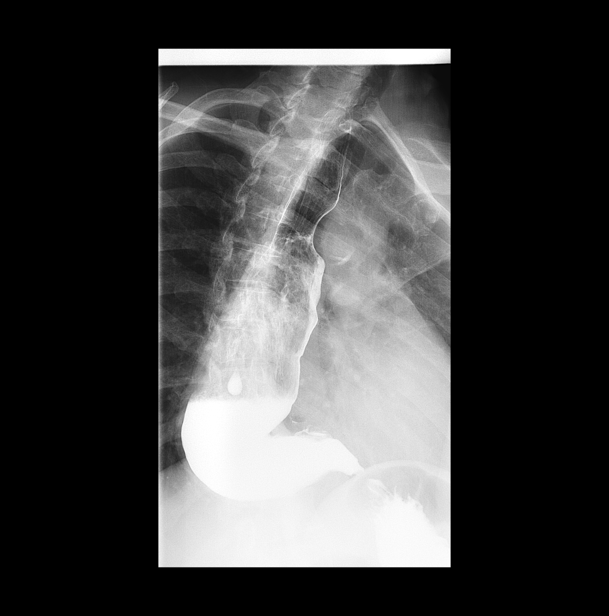
[frame 2/2]
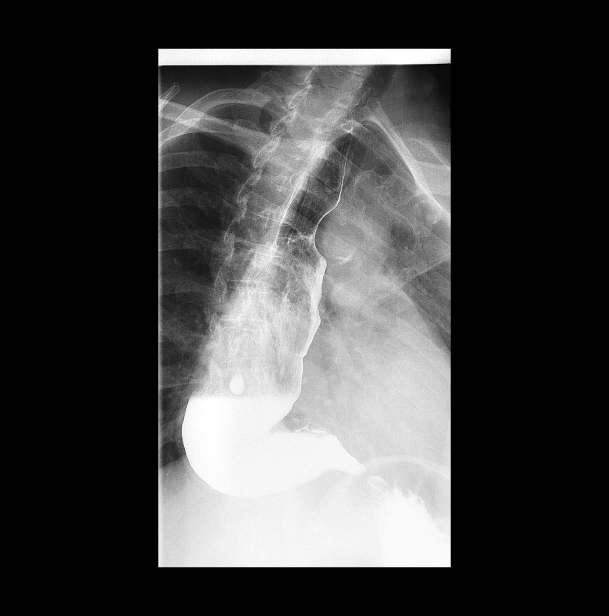

[Series 9: cp_standard · 3 of 177 frames shown (5 of 6)]
[frame 27/177]
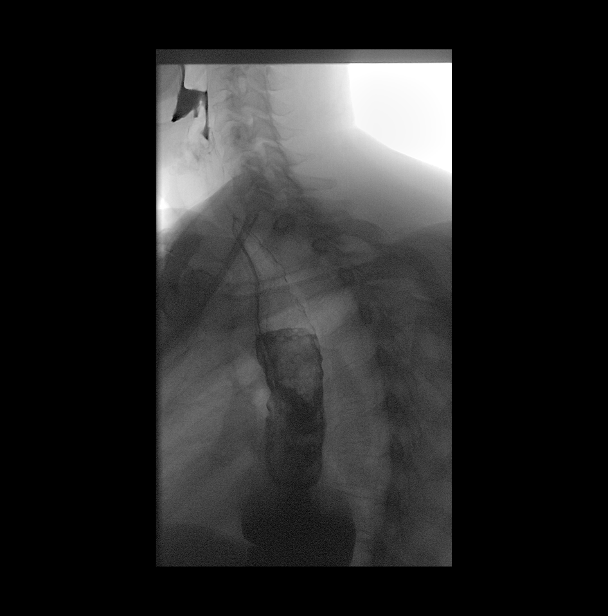
[frame 89/177]
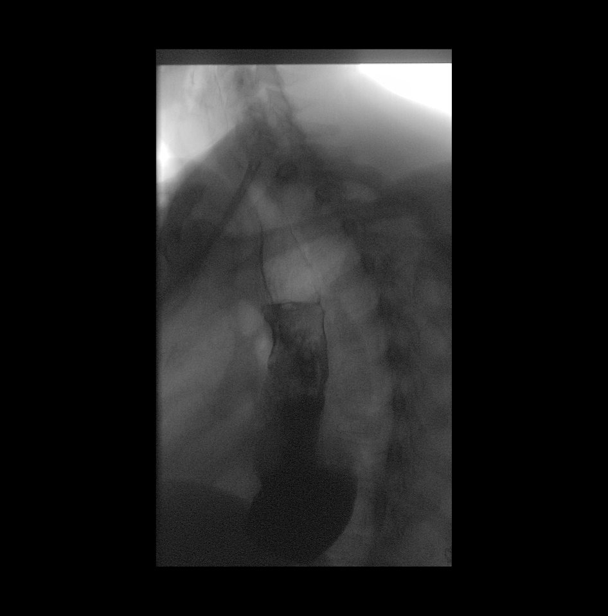
[frame 151/177]
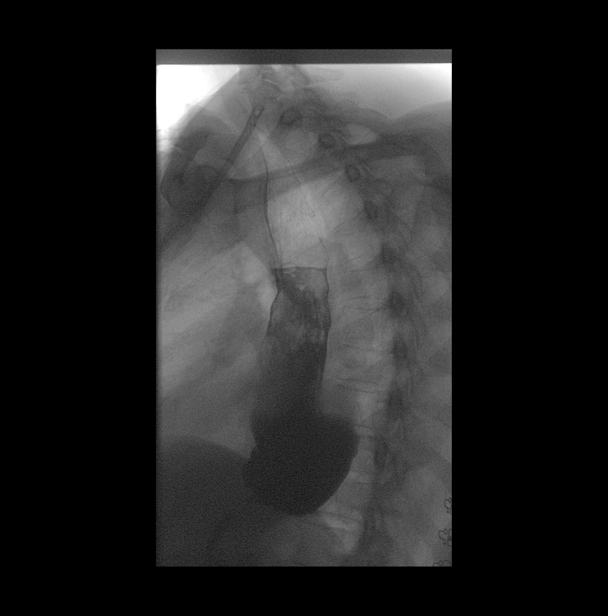

[Series 11: cp_standard · 1 of 1 slices shown (6 of 6)]
[im 1/1]
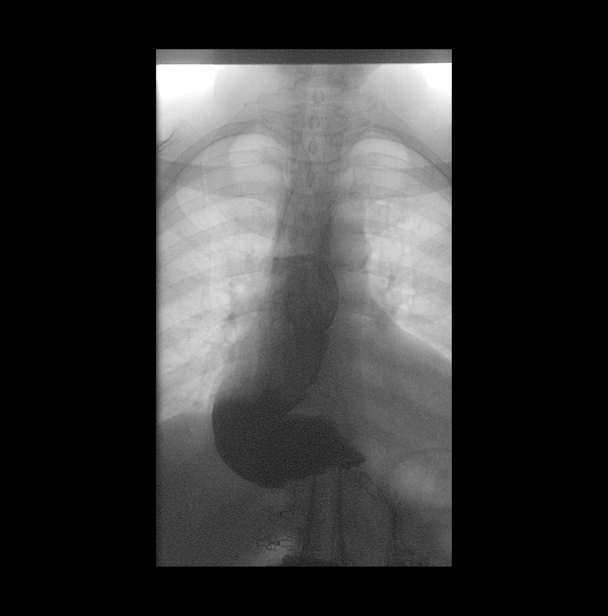

[13 of 21 positions shown; findings below may reference images not displayed]

FINDINGS: Similar to the prior esophagram of 07/11/2019, the esophagus is
significantly dilated. Redemonstrated marked narrowing of the distal
esophagus at the level of the GE junction. Also similar to prior
exams, there is diffuse abnormal esophageal motility with markedly
diminished primary peristaltic contractions. Additionally, there is
delayed and intermittent passage of contrast from the distal
esophagus into the stomach. No appreciable hiatal hernia. No
gastroesophageal reflux was observed.
IMPRESSION: Significantly dilated esophagus with persistent/recurrent marked
distal esophageal narrowing (at the level of the GE junction).
Additionally, there is diffuse esophageal dysmotility with markedly
diminished primary peristaltic motion, as well as delayed and
intermittent passage of contrast from the distal esophagus into the
stomach. Findings are compatible with the provided history of
achalasia.

ADDENDUM:
The fluorosocopic imaging was acquired by Deron Borbon.

*** End of Addendum ***
FINDINGS: Similar to the prior esophagram of 07/11/2019, the esophagus is
significantly dilated. Redemonstrated marked narrowing of the distal
esophagus at the level of the GE junction. Also similar to prior
exams, there is diffuse abnormal esophageal motility with markedly
diminished primary peristaltic contractions. Additionally, there is
delayed and intermittent passage of contrast from the distal
esophagus into the stomach. No appreciable hiatal hernia. No
gastroesophageal reflux was observed.
IMPRESSION: Significantly dilated esophagus with persistent/recurrent marked
distal esophageal narrowing (at the level of the GE junction).
Additionally, there is diffuse esophageal dysmotility with markedly
diminished primary peristaltic motion, as well as delayed and
intermittent passage of contrast from the distal esophagus into the
stomach. Findings are compatible with the provided history of
achalasia.

## 2023-11-13 ENCOUNTER — Other Ambulatory Visit: Payer: Self-pay | Admitting: Family Medicine

## 2023-11-13 DIAGNOSIS — E2839 Other primary ovarian failure: Secondary | ICD-10-CM

## 2023-11-20 ENCOUNTER — Other Ambulatory Visit: Payer: Self-pay | Admitting: Family Medicine

## 2023-11-20 DIAGNOSIS — Z1231 Encounter for screening mammogram for malignant neoplasm of breast: Secondary | ICD-10-CM

## 2023-12-15 ENCOUNTER — Ambulatory Visit
Admission: RE | Admit: 2023-12-15 | Discharge: 2023-12-15 | Disposition: A | Discharge status: Home / Self Care | Source: Ambulatory Visit | Attending: Family Medicine | Admitting: Family Medicine

## 2023-12-15 DIAGNOSIS — Z1231 Encounter for screening mammogram for malignant neoplasm of breast: Secondary | ICD-10-CM

## 2024-01-12 ENCOUNTER — Telehealth: Payer: Self-pay | Admitting: Oncology

## 2024-01-12 NOTE — Telephone Encounter (Signed)
 Patient has been scheduled for follow-up visit per 01/11/24 LOS.  Pt aware of scheduled appt details.

## 2024-02-29 ENCOUNTER — Other Ambulatory Visit: Payer: Medicare Other

## 2024-02-29 ENCOUNTER — Ambulatory Visit: Payer: Medicare Other | Admitting: Oncology

## 2024-03-02 ENCOUNTER — Inpatient Hospital Stay: Attending: Nurse Practitioner

## 2024-03-02 ENCOUNTER — Encounter: Payer: Self-pay | Admitting: Nurse Practitioner

## 2024-03-02 ENCOUNTER — Inpatient Hospital Stay (HOSPITAL_BASED_OUTPATIENT_CLINIC_OR_DEPARTMENT_OTHER): Admitting: Nurse Practitioner

## 2024-03-02 VITALS — BP 130/64 | HR 87 | Temp 97.8°F | Resp 18 | Ht 64.0 in | Wt 150.8 lb

## 2024-03-02 DIAGNOSIS — D649 Anemia, unspecified: Secondary | ICD-10-CM

## 2024-03-02 DIAGNOSIS — K220 Achalasia of cardia: Secondary | ICD-10-CM | POA: Diagnosis not present

## 2024-03-02 LAB — CBC WITH DIFFERENTIAL/PLATELET
Abs Immature Granulocytes: 0.02 K/uL (ref 0.00–0.07)
Basophils Absolute: 0 K/uL (ref 0.0–0.1)
Basophils Relative: 0 %
Eosinophils Absolute: 0.2 K/uL (ref 0.0–0.5)
Eosinophils Relative: 2 %
HCT: 32 % — ABNORMAL LOW (ref 36.0–46.0)
Hemoglobin: 10.5 g/dL — ABNORMAL LOW (ref 12.0–15.0)
Immature Granulocytes: 0 %
Lymphocytes Relative: 20 %
Lymphs Abs: 1.7 K/uL (ref 0.7–4.0)
MCH: 31.1 pg (ref 26.0–34.0)
MCHC: 32.8 g/dL (ref 30.0–36.0)
MCV: 94.7 fL (ref 80.0–100.0)
Monocytes Absolute: 0.5 K/uL (ref 0.1–1.0)
Monocytes Relative: 5 %
Neutro Abs: 6.2 K/uL (ref 1.7–7.7)
Neutrophils Relative %: 73 %
Platelets: 324 K/uL (ref 150–400)
RBC: 3.38 MIL/uL — ABNORMAL LOW (ref 3.87–5.11)
RDW: 14.2 % (ref 11.5–15.5)
WBC: 8.5 K/uL (ref 4.0–10.5)
nRBC: 0 % (ref 0.0–0.2)

## 2024-03-02 LAB — COMPREHENSIVE METABOLIC PANEL WITH GFR
ALT: 11 U/L (ref 0–44)
AST: 19 U/L (ref 15–41)
Albumin: 4.1 g/dL (ref 3.5–5.0)
Alkaline Phosphatase: 147 U/L — ABNORMAL HIGH (ref 38–126)
Anion gap: 13 (ref 5–15)
BUN: 8 mg/dL (ref 8–23)
CO2: 25 mmol/L (ref 22–32)
Calcium: 9.5 mg/dL (ref 8.9–10.3)
Chloride: 106 mmol/L (ref 98–111)
Creatinine, Ser: 0.73 mg/dL (ref 0.44–1.00)
GFR, Estimated: >60 mL/min (ref >60)
Glucose, Bld: 130 mg/dL — ABNORMAL HIGH (ref 70–99)
Potassium: 3.6 mmol/L (ref 3.5–5.1)
Sodium: 144 mmol/L (ref 135–145)
Total Bilirubin: 0.2 mg/dL (ref 0.0–1.2)
Total Protein: 6.7 g/dL (ref 6.5–8.1)

## 2024-03-02 LAB — IRON AND TIBC
Iron: 53 ug/dL (ref 28–170)
Saturation Ratios: 17 % (ref 10.4–31.8)
TIBC: 307 ug/dL (ref 250–450)
UIBC: 254 ug/dL

## 2024-03-02 LAB — LACTATE DEHYDROGENASE: LDH: 284 U/L — ABNORMAL HIGH (ref 98–192)

## 2024-03-02 LAB — FERRITIN: Ferritin: 43 ng/mL (ref 11–307)

## 2024-03-02 NOTE — Progress Notes (Signed)
 [facility]   [phone] Fax:([phone]) [phone]    Patient Care Team: Regino Slater, MD as PCP - General (Family Medicine) Sheldon Standing, MD as Consulting Physician (General Surgery) Rosalie Kitchens, MD as Consulting Physician (Gastroenterology) Unice Pac, MD as Consulting Physician (Neurosurgery)   CHIEF COMPLAINT: Follow up anemia   CURRENT THERAPY: Observation  INTERVAL HISTORY Melinda Drake returns for follow up as scheduled, last seen by Dr. Autumn 08/2023.  Doing well overall, denies significant changes in her health.  Still struggles with frequent vomiting from achalasia, [facility] suggested seeing a cardiothoracic surgeon which has not been done to date.  She plans to follow-up with local GI soon.  Denies any bleeding.  Denies fever, night sweats, adenopathy, new pain, or any other specific complaints.  She is up-to-date on age-appropriate cancer screenings.  ROS  All other systems reviewed and negative  Past Medical History:  Diagnosis Date   Arthritis    Bronchitis    Diabetes mellitus without complication (HCC)    Family history of adverse reaction to anesthesia    N/V    GERD (gastroesophageal reflux disease)    H/O stem cell transplant (HCC) 1997   History of biopsy 1997   left side neck   Non Hodgkin's lymphoma (HCC) 1997   PONV (postoperative nausea and vomiting)      Past Surgical History:  Procedure Laterality Date   ANKLE FUSION Left 04/27/2013   Procedure: ANKLE FUSION- left;  Surgeon: Jerona LULLA Sage, MD;  Location: [facility] OR;  Service: Orthopedics;  Laterality: Left;  Left Subtalar Fusion and Talonavicular Fusion   BOTOX  INJECTION  06/04/2021   Procedure: BOTOX  INJECTION;  Surgeon: Rosalie Kitchens, MD;  Location: [facility];  Service: Endoscopy;;   ESOPHAGEAL MANOMETRY N/A 03/05/2015   Procedure: ESOPHAGEAL MANOMETRY (EM);  Surgeon: Kitchens Rosalie, MD;  Location: [facility];  Service: Endoscopy;  Laterality: N/A;   ESOPHAGEAL MANOMETRY  Bilateral 01/08/2022   Procedure: ESOPHAGEAL MANOMETRY (EM);  Surgeon: Rosalie Kitchens, MD;  Location: [facility];  Service: Gastroenterology;  Laterality: Bilateral;   ESOPHAGOGASTRODUODENOSCOPY (EGD) WITH PROPOFOL  N/A 06/04/2021   Procedure: ESOPHAGOGASTRODUODENOSCOPY (EGD) WITH PROPOFOL ;  Surgeon: Rosalie Kitchens, MD;  Location: [facility];  Service: Endoscopy;  Laterality: N/A;   EYE SURGERY  ? early 2000's   bilateral cataracts   LAPAROSCOPIC LYSIS OF ADHESIONS  05/03/2015   Procedure: LAPAROSCOPIC LYSIS OF ADHESIONS;  Surgeon: Standing Sheldon, MD;  Location: [facility];  Service: General;;   lymphoma excision Left 1996     Outpatient Encounter Medications as of 03/02/2024  Medication Sig Note   acetaminophen  (TYLENOL ) 650 MG CR tablet Take 1,300 mg by mouth every 8 (eight) hours as needed for pain.    atorvastatin  (LIPITOR) 20 MG tablet Take 20 mg by mouth daily.    cetirizine (ZYRTEC) 10 MG tablet Take 10 mg by mouth daily as needed for allergies.     Cholecalciferol  (DIALYVITE VITAMIN D  5000) 125 MCG (5000 UT) capsule Take 15,000 Units by mouth once a week.    clobetasol cream (TEMOVATE) 0.05 % Apply 1 Application topically 2 (two) times daily.    Continuous Glucose Sensor (FREESTYLE LIBRE 2 SENSOR) MISC     gabapentin  (NEURONTIN ) 300 MG capsule Take 300 mg by mouth 3 (three) times daily. (Patient taking differently: Take 300 mg by mouth 3 (three) times daily. Reports taking once or twice a day) 09/01/2023: Taking once a day   ibuprofen (ADVIL) 800 MG tablet Take 800 mg  by mouth 3 (three) times daily as needed. (Patient taking differently: Take 800 mg by mouth 3 (three) times daily as needed. Reports only taking once a day) 09/01/2023: Taking 800mg s daily   insulin  glargine (LANTUS ) 100 UNIT/ML injection Inject 20 Units into the skin daily.    insulin  lispro (HUMALOG ) 100 UNIT/ML injection HumaLOG     omeprazole (PRILOSEC) 20 MG capsule Take 20 mg by mouth daily.    Potassium 99 MG TABS Take 99 mg by  mouth daily.    No facility-administered encounter medications on file as of 03/02/2024.     Today's Vitals   03/02/24 1045 03/02/24 1046 03/02/24 1107 03/02/24 1120  BP: (!) 142/54 (!) 130/58  130/64  Pulse: 87     Resp: 18     Temp: 97.8 F (36.6 C)     TempSrc: Temporal     SpO2: 100%     Weight: 150 lb 12.8 oz (68.4 kg)     Height: 5' 4 (1.626 m)     PainSc:   0-No pain    Body mass index is 25.88 kg/m.    PHYSICAL EXAM GENERAL:alert, no distress and comfortable SKIN: no rash  EYES: sclera clear NECK: without mass LYMPH:  no palpable cervical or supraclavicular lymphadenopathy  LUNGS: clear with normal breathing effort HEART: regular rate & rhythm, no lower extremity edema NEURO: alert & oriented x 3 with fluent speech, no focal motor/sensory deficits   CBC    Latest Ref Rng & Units 03/02/2024   10:38 AM 08/18/2023    9:04 AM 06/01/2023    9:35 AM  CBC  WBC 4.0 - 10.5 K/uL 8.5  11.2  10.3   Hemoglobin 12.0 - 15.0 g/dL 89.4  89.2  89.5   Hematocrit 36.0 - 46.0 % 32.0  32.5  31.1   Platelets 150 - 400 K/uL 324  379  342       CMP     Latest Ref Rng & Units 03/02/2024   10:38 AM 08/18/2023    9:04 AM 06/01/2023    9:35 AM  CMP  Glucose 70 - 99 mg/dL 869  834  99   BUN 8 - 23 mg/dL 8  20  21    Creatinine 0.44 - 1.00 mg/dL 9.26  9.30  9.29   Sodium 135 - 145 mmol/L 144  140  140   Potassium 3.5 - 5.1 mmol/L 3.6  3.7  3.8   Chloride 98 - 111 mmol/L 106  103  104   CO2 22 - 32 mmol/L 25  29  30    Calcium  8.9 - 10.3 mg/dL 9.5  9.7  89.9   Total Protein 6.5 - 8.1 g/dL 6.7  7.4  7.0   Total Bilirubin 0.0 - 1.2 mg/dL 0.2  0.5  0.5   Alkaline Phos 38 - 126 U/L 147  141  98   AST 15 - 41 U/L 19  15  12    ALT 0 - 44 U/L 11  9  8        ASSESSMENT & PLAN: 68 yo female  Normocytic normochromic anemia - Initial visit 06/01/2023 hemoglobin of 10.4, hematocrit 31.1, MCV 92.6.  White count was 10,300 with normal differential.  Platelet count was normal at 342,000.   Reticulocyte count was normal.  Iron studies, B12, folate, TSH, LDH, haptoglobin were all within normal limits.  CMP unremarkable.  Coombs test negative. -08/18/2023 Hemoglobin was stable at 10.7.  SPEP showed no evidence of M spike.  Serum  free light chains were within normal limits.  No evidence of monoclonal gammopathy.  Serum copper , methylmalonic acid, repeat iron studies were all within normal limits. LDH slightly elevated -History of non-Hodgkin's lymphoma treated with chemotherapy and autologous stem cell transplant. ?  Could be contributing to normocytic anemia.  -Ms. Kiener appears stable. She is asymptomatic of anemia. Labs are stable. LDH slightly elevated similar to 08/2023; Tbili is normal, haptoglobin is pending to rule out hemolysis.  -Her anemia remains mild and stable. No other cytopenias. I recommend to continue observation.  -If she has progressive anemia or new cytopenias, we would consider ruling out am indolent bone marrow condition  Achalasia -With esophageal damage, followed by local and [facility] -[facility] suggested cardiothoracic surgery referral but has not happened yet. Pt to f/up with local GI.     PLAN: -Labs reviewed -Continue monitoring/observation -Lab and f/up in 6 months, or sooner if needed   All questions were answered. The patient knows to call the clinic with any problems, questions or concerns. No barriers to learning were detected. I spent 20 minutes counseling the patient face to face. The total time spent in the appointment was 30 minutes and more than 50% was on counseling, review of test results, and coordination of care.   Eddie Payette K Melinda Madewell, NP 03/02/2024

## 2024-03-03 LAB — HAPTOGLOBIN: Haptoglobin: 191 mg/dL (ref 37–355)

## 2024-07-28 ENCOUNTER — Other Ambulatory Visit

## 2024-08-15 NOTE — Progress Notes (Signed)
 " Thoracic Surgery Clinic - Return Patient Visit  DIAGNOSIS AND TREATMENT SUMMARY   Diagnosis: achalasia  Preoperative Therapy: 1. 2022: EGD and botox  injections  Procedure(s):   1.  2016: s/p robotic Heller myotomy and toupet fundoplication, anterior/posterior gastropexy @ OSH [facility]) 2.  09/04/22:  POEM at [facility] (Dr. Francois)  Pathology:   N/A  Postoperative therapy: None  SUBJECTIVE   Current Status  Melinda Drake presents to the thoracic surgery clinic for further evaluation of  her known achalasia, initially seen by Dr. Murlean in September 2025.  Since the last thoracic surgery clinic visit, she has been in usual state of health with no significant changes in symptoms.     She still has dysphagia and emesis.  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 Smoking Status:  Previously smoked cigarettes, quit 2014  GLP-1/SGLT-2 status: none  Allergies   Allergies  Allergen Reactions   Codeine Nausea   Morphine  Nausea and Unknown   Other Nausea And Vomiting    General anesthesia      Medications   Current Outpatient Medications  Medication Sig Dispense Refill   acetaminophen  (TYLENOL ) 325 MG tablet 1 tablet Orally as needed     apple cider vinegar 500 mg Tab Take by mouth as directed     aspirin  325 MG tablet TAKE 1 TABLET BY MOUTH TWICE A DAY FOR ONE MONTH AFTER SURGERY     atorvastatin  (LIPITOR) 20 MG tablet Take 20 mg by mouth every morning     blood-glucose sensor (FREESTYLE LIBRE 3 PLUS SENSOR) Devi as directed e10.9; Duration: 90 days     cephalexin (KEFELX) 250 mg tablet take 1 tablet by oral route  three times daily x 10 days to end (Patient not taking: Reported on 05/09/2024)     cetirizine (ZYRTEC) 10 MG tablet Take 10 mg by mouth once daily as needed     cholecalciferol  (VITAMIN D3) 1000 unit capsule 2 capsule Orally Once a day     cholecalciferol  (VITAMIN D3) 5,000 unit capsule Take  15,000 Units by mouth once a week     cholecalciferol , vitamin D3, (VITAMIN D3) 125 mcg (5,000 unit) tablet 3 Tablet Orally Once a day     clobetasoL (TEMOVATE) 0.05 % cream Apply 1 Application topically 2 (two) times daily     ferrous sulfate 325 (65 FE) MG EC tablet Take 325 mg by mouth Twice a week     flash glucose sensor (FREESTYLE LIBRE 2 SENSOR) Kit      flurandrenolide (CORDRAN TAPE LARGE ROLL) 4 mcg/cm2 dressing 1 application to affected area Externally Once a day     gabapentin  (NEURONTIN ) 300 MG capsule Take 300 mg by mouth once daily     HYDROcodone -acetaminophen  (NORCO) 10-325 mg tablet TAKE 1 TABLET EVERY 4 HOURS BY ORAL ROUTE AS NEEDED, FOR SEVERE PAIN.     HYDROcodone -acetaminophen  (NORCO) 5-325 mg tablet take 1-2 tablet by oral route  every 4-6 hours as needed for pain     hydroquinone 4 % cream 1 Application (Patient not taking: Reported on 05/09/2024)     ibuprofen (MOTRIN) 800 MG tablet Take 800 mg by mouth 3 (three) times daily as needed for Pain     insulin  GLARGINE (LANTUS ) injection (concentration 100 units/mL) Inject subcutaneously at bedtime 20-22 units at night     insulin  LISPRO (ADMELOG , HUMALOG ) injection (concentration 100 units/mL) Inject subcutaneously 3 (three) times daily with meals 4-7 units  irbesartan  (AVAPRO ) 150 MG tablet 1 tablet Orally Once a day     irbesartan  (AVAPRO ) 75 MG tablet Take 75 mg by mouth every morning (Patient not taking: Reported on 05/09/2024)     irbesartan -hydroCHLOROthiazide (AVALIDE) 150-12.5 mg tablet Take 1 tablet by mouth once daily     magnesium oxide-Mg AA chelate (MAGNESIUM, OXIDE/AA CHELATE,) 300 mg Cap 1 capsule with a meal Orally Once a day; Duration: 30 day(s) (Patient not taking: Reported on 05/09/2024)     meloxicam (MOBIC) 15 MG tablet 1 tablet Orally Once a day; Duration: 30 day(s)     methocarbamoL  (ROBAXIN ) 500 MG tablet Take by mouth (Patient not taking: Reported on 12/24/2022)     NANO 2ND GEN PEN  NEEDLE 32 gauge x 5/32 Ndle      naproxen (NAPROSYN) 500 MG tablet Take by mouth every 12 (twelve) hours     naproxen sodium (ALEVE) 220 MG tablet 1 tablet with food or milk as needed Orally every 12 hrs (Patient not taking: Reported on 05/09/2024)     omeprazole (PRILOSEC OTC) 20 MG EC tablet Take 1 tablet by mouth once daily     omeprazole (PRILOSEC) 20 MG DR capsule 1 capsule 30 minutes before morning meal Orally as needed; Duration: 30 days (Patient not taking: Reported on 05/09/2024)     omeprazole (PRILOSEC) 40 MG DR capsule TAKE 1 CAPSULE BY MOUTH 2 TIMES DAILY BEFORE MEALS. (Patient not taking: Reported on 05/09/2024) 180 capsule 1   ondansetron  (ZOFRAN ) 4 MG tablet Take by mouth every 12 (twelve) hours     oxyCODONE  (ROXICODONE ) 5 MG immediate release tablet Take 5 mg by mouth every 4 (four) hours as needed for Pain     potassium gluconate 2.5 mEq Tab Take 99 mg by mouth once daily     predniSONE 5 mg DsPk TAKE 1 DOSE PK BY ORAL ROUTE AS DIRECTED FOR 6 DAYS.     SEMGLEE ,INSULIN  GLARG-YFGN,PEN 100 unit/mL (3 mL) InPn Inject 20 Units subcutaneously every evening (Patient not taking: Reported on 05/11/2023)     SENEXON-S 8.6-50 mg tablet TAKE 2 TABLET BY MOUTH ONCE A DAY WHEN TAKING PAIN MEDICATION     triamcinolone 0.1 % cream 1 Application     TRULANCE 3 mg tablet Take 3 mg by mouth once daily     VITAMIN D3 ORAL Vitamin D3     No current facility-administered medications for this visit.     OBJECTIVE   Vitals: There were no vitals filed for this visit.     ECOG Performance Scale:  0 - Fully active, able to carry on all pre-disease performance without restriction.   Physical Exam  General: well developed, well nourished, and in no acute distress Neurologic:   Cranial nerves II-XII grossly intact bilaterally and non-focal exam Psych: oriented to time, place and person, mood and affect are within normal limits Skin:  no rashes or lesions noted on physical exam   Neck:   Supple, no masses, and no tenderness Lymph Nodes:  No palpable  cervical and supraclavicular lymph nodes Heart:   regular rate and rhythm , S1 and S2 present , no murmur, no rub, and no gallop Breast/Chest Wall: deferred Lungs:  clear to ausculatation bilaterally , no rhonchi, no wheezing, and no crackles GI/Abdomen:  soft , nontender, nondistended, no masses, no hepatosplenomegaly, no palpable abdominal masses, and bowel sounds present  Extremities:   capillary refill < 3 seconds  and no edema   DIAGNOSTIC STUDIES  The following  studies were personally reviewed by Dr. Murlean in clinic today:   05/21/2023 EGD with EndoFLIP and BRAVO pH Capsule Placement         Pathology - EGD (05/21/2023) DIAGNOSIS  A.  Stomach, endoscopic biopsy: Gastric oxyntic and antral mucosa with no significant pathologic change. Negative for evidence of Helicobacter pylori on routine H&E.      Procedure Report - Peroral Endoscopic Myotomy (POEM) (09/04/2022 [facility])       Esophageal Manometry (01/08/2022 [facility], [place], [place])    Barium Swallow (11/07/2021 [facility], [place], [place] - see CareEverywhere) CLINICAL DATA:  Provided history: Achalasia of esophagus. Esophageal  dysphagia. Additional history provided: Patient reports feeling as  though food is becoming stuck in the region of the mid chest.  Symptoms since 2008. Prior botox  injections October 2022. Prior  surgery.   EXAM:  ESOPHOGRAM/BARIUM SWALLOW   TECHNIQUE:  Combined double contrast and single contrast examination performed  using effervescent crystals, thick barium liquid, and thin barium  liquid.   FLUOROSCOPY:  Fluorsocopy time: 54 seconds   Radiation Exposure Index (as provided by the fluoroscopic device):  4.9 mGy   COMPARISON:  Prior esophagram is a 07/11/2019 and earlier.   FINDINGS:  Similar to the prior esophagram of 07/11/2019, the esophagus is   significantly dilated. Redemonstrated marked narrowing of the distal  esophagus at the level of the GE junction. Also similar to prior  exams, there is diffuse abnormal esophageal motility with markedly  diminished primary peristaltic contractions. Additionally, there is  delayed and intermittent passage of contrast from the distal  esophagus into the stomach. No appreciable hiatal hernia. No  gastroesophageal reflux was observed.   IMPRESSION:  Significantly dilated esophagus with persistent/recurrent marked  distal esophageal narrowing (at the level of the GE junction).  Additionally, there is diffuse esophageal dysmotility with markedly  diminished primary peristaltic motion, as well as delayed and  intermittent passage of contrast from the distal esophagus into the  stomach. Findings are compatible with the provided history of  achalasia.    EGD with Botox  Injection (06/04/2021 [facility], [place], [place] - see CareEverywhere) Findings: A small hiatal hernia was present.  Fluid was found in the middle third of the esophagus and in the lower  third of the esophagus. Fluid aspiration was performed.  No appreciable esophageal motility was noted. In addition, a hypertonic  lower esophageal sphincter was found. There was mild resistance to  endoscope advancement into the stomach. The Z-line was regular. The  gastroesophageal junction and cardia were normal on retroflexed view.   Area was successfully injected with 100 units botulinum toxin.  The entire examined stomach was normal.  The ampulla, duodenal bulb, first portion of the duodenum, second  portion of the duodenum, major papilla, area of the papilla and third  portion of the duodenum were normal.  The exam was otherwise without abnormality.  Impression:  - Small hiatal hernia. - Fluid in the middle third of the esophagus and in  the lower third of the esophagus. Fluid aspiration   performed. - Achalasia. Injected with botulinum toxin. - Normal stomach. - Normal ampulla, duodenal bulb, first portion of  the duodenum, second portion of the duodenum, major  papilla, area of the papilla and third portion of  the duodenum. - The examination was otherwise normal.  Moderate Sedation: Not Applicable - Patient had care per Anesthesia.  Recommendation:  - Patient has a contact number  available for  emergencies. The signs and symptoms of potential  delayed complications were discussed with the  patient. Return to normal activities tomorrow.  Written discharge instructions were provided to the  patient. - Soft diet today. - Continue present medications. - Return to GI clinic in 6 weeks. - Telephone GI clinic if symptomatic PRN.    Barium Swallow (07/11/2019 [facility], [place], [place] - see CareEverywhere) COMPARISON:  None.  FINDINGS: Again seen is a markedly dilated esophagus with distal esophageal narrowing at the level of the GE junction. The mid and distal esophagus is filled with retained food debris. When compared with study from 01/24/2015 the appearance of the esophagus does not appear significantly changed. The motility of the esophagus is diffusely abnormal with markedly diminished primary peristaltic motion. No hiatal hernia or reflux identified.  IMPRESSION: 1. Markedly dilated esophagus containing food debris with narrowing at the level of the GE junction. Imaging findings compatible with diagnosis of achalasia.    Operative Note - Robot-Assisted Laparoscopic Heller Myotomy, Gastropexy, and Partial Toupet Fundoplication (05/03/2015 [facility], [place], [place] - see CareEverywhere) PATIENT: Melinda Drake 69 y.o. female  Patient Care Team: Elfrieda Haggard, MD as PCP - General (Family Medicine)  PRE-OPERATIVE DIAGNOSIS: Achalasia  POST-OPERATIVE DIAGNOSIS:  Achalasia  PROCEDURE:  Laparoscopic lysis adhesions 60 minutes. XI ROBOTIC ASSISTED LAPAROSCOPIC HELLER CARDIOMYOTOMY  Toupet (posterior 180deg) fundoplication x 4cm. Anterior & posterior gastropexy Type 2 mediastinal dissection.  SURGEON: Surgeon(s): Elspeth Schultze, MD   Jina Nephew, MD - Assist  ANESTHESIA: local and general  EBL: Total I/O In: 2000 [I.V.:2000] Out: 250 [Urine:200; Blood:50]  Delay start of Pharmacological VTE agent (>24hrs) due to surgical blood loss or risk of bleeding: no  ANESTHESIA: 1. General anesthesia. 2. Local anesthetic in a field block around all port sites.  SPECIMEN: Mediastinal hernia sac (not sent).  DRAINS: A 19-French Blake drain goes from the right upper quadrant along the lesser curvature of the stomach into the mediastinum.  COUNTS: YES  PLAN OF CARE: Admit for overnight observation  PATIENT DISPOSITION: PACU - hemodynamically stable.  INDICATION:   Patient with symptomatic Achalasia. The patient has had extensive work-up & is felt to benefit from repair:  The anatomy & physiology of the foregut and anti-reflux mechanism was discussed. The pathophysiology of achalasia was discussed. Natural history risks without surgery was discussed. The patient's symptoms are not adequately controlled by non-operative treatments. I feel the risks of no intervention will lead to serious problems that outweigh the operative risks; therefore, I recommended surgery to relax the fibrotic LES & rebuild the anti-reflux valve to control reflux. Need for a thorough workup to rule out the differential diagnosis and plan treatment was explained. I explained laparoscopic techniques with possible need for an open approach.  Risks such as bleeding, infection, abscess, leak, need for further treatment, heart attack, death, and other risks were discussed. I noted a good likelihood this will help address the problem. Goals of post-operative recovery were discussed as  well. Possibility that this will not correct all symptoms was explained. Post-operative dysphagia, need for short-term liquid & pureed diet, inability to vomit, possibility of recurrence needing further treatment, possible need for medicines to help control symptoms in addition to surgery were discussed. We will work to minimize complications. Educational handouts further explaining the pathology, treatment options, and dysphagia diet was given as well. Questions were answered. The patient expresses understanding & wishes to proceed with surgery.  OR FINDINGS:   No PEH hiatal hernia  It is a primary crural repair x 3   The myotomy is 10 cm long on the distal esophagus & 3 cm long on the cardia/anterior stomach.  The patient has a 5 cm Toupet (posterior 180 deg) fundoplication. The patient has had bilateral anterior and posterior gastropexies.  DESCRIPTION:   Informed consent was confirmed. The patient received IV antibiotics prior to incision. The underwent general anesthesia without difficulty. A Foley catheter was sterilely placed. The patient was positioned in split leg with arms tucked. Orogastric tube evacuated 500 mL of brackish fluid out of the esophagus. The abdomen was prepped and draped in the sterile fashion. Surgical time-out confirmed our plan.  I used a Varess technique in the left subcostal region. No blood & flushed well. Induced capnoperitoneum. After 15 mmHg pressure, an 8mm Xi port was placed in the left lateral upper quadrant. Entry was clean. Inspection revealed no injury. Additional 8 mm Xi robotic ports were placed in the upper abdomen. There were significant omental adhesions in the bilateral upper quadrants. I gradually freed those off the falciform ligament as well. Camera inspection revealed no injury. I also placed a 5 mm port in the left subxiphoid region under direct visualization. I removed that and placed an Omega-shaped rigid Nathanson liver retractor to lift the left  lateral sector of the liver anteriorly to expose the esophageal hiatus. This was secured to the bed using the iron man system. I placed another 5mm port in the RLQ for the assist.  I entered through the thin hepatogastric ligament. With that I could get into the anterior mediastinum between the right crus and esophagus. I used primarily focused gentle blunt dissection as well as focused vessel sealer dissection. I transected phrenoesophageal attachments to the inner right crus sac until I found the base of the crura. I then came around anteriorly on the left side and freed up the phrenoesophageal attachments on the medial part of the left crus on the superior half.   We ligated the short gastrics along the lesser curvature of the stomach about a third way down and then came up over the fundus. We released the attachments of the stomach to the retroperitoneum until we were able to connect with the prior dissection on the left crus. We completed the release of phrenoesophageal attachments to the medial part of the left crus down to its base. With this, we had circumferential mobilization.   We placed the stomach and esophagus on axial tension. I then did a type 2 mediastinal dissection where I freed the esophagus from its attachments to the aorta, pleura, and pericardium using primarily gentle blunt as well as focused ultrasonic dissection. We saw the anterior vagus nerve & preserved if anteriorly. The posterior vagus nerve was intact. We preserved it at all times. With that I could straighten out the esophagus and get 5 cm of intra-abdominal length of the esophagus at a best estimation. The esophagus seemed strictured distally and was very dilated in the mediastinum consistent with chronic inflammation.  I mobilized the anterior vagus nerve off the esophagus as it crossed over more towards the left more proximally. This helped expose the anterior esophagus and anterior cardia. I dissected out & removed the  anterior epiphrenic pad. Posterior epiphrenic fat was small & left in place. With that, I could better define the esophagogastric junction. I brought the fundus of the stomach posterior to the esophagus over to the right side. The wrap  was mobile with the classic shoe shinebmaneuver. Wrap became together gently.  I then proceeded with myotomy using focused blunt dissection and hook cautery. I carefully split the longitudinal fibers of the distal esophagus. I came underneath the circular ring fibers of the esophagus. They were very fibrotic. I freed off the mucosa. Transected across them using some gentle avulsion technique as well as focused hook dissection with the blade up. We continued more proximally up above the esophageal hiatus. This took some time as there was some moderate scarring consistent with prior Botox  injections & dilations. The ring muscle fibers were quite thickened and fibrotic 4 cm up the esophagus. I ended up extending this more proximally in usual until I came to more soft and pink normal circular fibers, implying I was above the most strictured and fibrotic area. This is around 10 cm from the esophagogastric junction. Then redirected and proceeded to free off the fibers off the anterior cardia of the stomach, taking care to avoid entry into the mucosa. Skeletonized and freed off oblique fibers. Because of severe scarring in [place] dissection did have to repair 2 areas of mucosa with 4-0 Monocryl suture horizontal mattress suture to good result. I measured the length of myotomy and it was 10 cm on the esophagus and 3 cm onto the cardia. I meticulously reinspected. Branching transverse vessels on the mucosa were carefully elevated and transected. I direction visualization with good robotic camera work. I was able to free muscle off so that the mucosa was exposed for about a third of the circumference for a nice broad myotomy.  We then reflected the stomach left laterally and closed the  esophageal hiatus using 0 Ethibond stitch. I did that x3 stitches, Incorporating the posterior part of the wrap for a good posterior gastropexy 3. The crura had good substance and they came together well without any tension. We avoided making it too snug.  I then did a left anterior gastropexy taking the apex of the left side of the wrap and tacked it to the left anterior crura just posterior to the phrenic vein. Also a bite of the left anterior side of the esophageal myotomy. I tied that down. I did a similar stitch on the right anterior aspect in a mirror-image fashion. This created an anterior gastropexies on each side and helped the wrap for completion. I did 3 more pairs of stitches distally, taking a bite of the side of the wrap with a myotomy for a posterior Toupet fundoplication. I did an extra stitch on the right side that was between the wrap and the myotomy of the right medial cardia of the stomach to help keep the myotomy open. I measured the length of the fundoplication. It was 5 cm. This help to allow the mucosa to be well exposed and the myotomy to stay wide open. Again no evidence of leak / perforation.  I did irrigation and ensured hemostasis on the liver. I saw no evidence of any leak or perforation or other abnormality. No evidence of any mucosal gastric or bowel injury. I removed the [facility] liver retractor under direct visualization. Hemostasis was good.I evacuated carbon dioxide and removed the ports. The skin was closed with Monocryl and sterile dressings applied.  The patient is extubated and brought back to the recovery room. I discussed postop care in detail with the patient and family in in the office. Discussed again with the patient in the holding area. Instructions are written. The patient has no one here. I  did offer discuss with family recall them. He declined. We will see if anyone shows up tomorrow.  Elspeth KYM Schultze, M.D., F.A.C.S. Gastrointestinal and Minimally Invasive  Surgery [facility]    Esophageal Manometry (03/05/2015 [facility], [place], [place] - see CareEverywhere) Report is unavailable in [facility] records   Per H&P for Toupet and Heller:  Because of persistent/worsening dysphagia and diagnosis of achalasia, she was sent to gastroenterology.  Dr. Rosalie noted obstruction on fluoroscopy and confirmed on endoscopy.  Manometry shows complete aperistalsis.  Lower esophageal sphincter seems weak on manometry but that clashes with obvious tight narrowing on endoscopy and fluoroscopy.   Barium Swallow (01/24/2015 [facility], [place], [place] - see CareEverywhere) COMPARISON:  CT chest of 05/06/2013  FINDINGS: Initially double-contrast barium swallow was performed. There is poor coating of the mucosa in view of the markedly dilated thoracic esophagus. Rapid sequence spot films of the cervical region show a normal swallowing mechanism again with a dilated proximal thoracic aorta. There is markedly diminished primary peristaltic motion. Unfortunately due to the retained fluid and food debris in the dilated esophagus, the gastroesophageal junction is very difficult to visualize despite attempting several obliquities in the erect and supine -prone positions. The lumen appears to be markedly narrowed consistent with achalasia. Also in discussing these findings with the patient she does have a history of prominence of the muscle at the gastroesophageal junction, and most likely this does represent achalasia. Obviously since that area is not well seen on today's study, carcinoma cannot be excluded and endoscopy is recommended. Also, in review of the CT chest from 05/06/2013, the esophagus was noted to be dilated at that time, again a finding most consistent with the diagnosis of achalasia.  IMPRESSION: Markedly dilated esophagus most consistent with  achalasia. However in view of the poor visualization of the GE junction, endoscopy is recommended as noted above.   IMPRESSION AND PLAN   Melinda Drake is a 69 y.o. female with a history of DM, HTN, nonHodgkin lympoma w/ prior Boody tranpslant 1997, former smoker (quit 2014), longstanding hx achalasia s/p robotic Heller myotomy, Toupet, gastropexy in 2016 at OSH, w/ recurrent symptoms s/p POEM 09/04/22, w/ recurrent symptoms s/p EGD w/ endoflip 05/21/23 w/ continued achalasia, now presenting for evaluation for esophagectomy.   She is requesting to delay the operation due to needing to time to process the information and understanding that this operation is a very big undertaking  Follow-up in 3 months and re-discuss surgery and timing at that time. Last HgbA1C in 2021 was 5.8%. Will repeat   I personally reviewed the radiographic studies and evaluated the patient.  I discussed the plan above with the patient.  Melinda Drake expressed an understanding of this discussion and recommendations above.  All questions were answered to her satisfaction.   Clinical Notes on My Chart: Progress notes documented by your healthcare team are available on my chart portal. We encourage you to review notes after visits and in preparation for upcoming appointments. This provides the opportunity to review recommendations as well as prepare questions for your healthcare team to address during your next visit. If you have specific questions related to the notes, please bring them to your next scheduled visit to discuss with your physician, nurse practitioner or physician assistant. With increased transparency, our hope is that we can create better communication, more shared decision-making, and increased satisfaction. However, if you have questions regarding the plan of  care, these may be managed by calling our office or through MyChart.  Patient Care Team: Regino Slater, MD as PCP - General (Family  Medicine) Sheldon, Elspeth Bitter, MD as Consulting Provider (General Surgery) Magod, Oliva Earnest, MD (Gastroenterology) Francois, Darin Lyn, MD as Consulting Provider (Gastroenterology)  I spent a total of 60 minutes in both face-to-face and non-face-to-face activities, excluding procedures performed, for this visit on the date of this encounter. Melinda Drake is an established patient who returns for ongoing management of end stage achalasia. At today's visit, I personally reviewed all available studies and evaluated the patient. Based on these findings, I have recommended esophagectomy when she is ready.  I discussed the plan above with the patient.  Melinda Drake expressed an understanding of this discussion and recommendations above.  All questions were answered to their satisfaction.   Attestation Statement:   I personally saw the patient and performed a substantive portion of the medical decision making, in conjunction with the Advanced Practice Provider for the condition/treatment of end stage achalasia .  JACOB ROME SOURS, MD  JACOB ROME SOURS, MD      "

## 2024-08-30 ENCOUNTER — Other Ambulatory Visit: Payer: Self-pay | Admitting: Oncology

## 2024-08-30 DIAGNOSIS — D649 Anemia, unspecified: Secondary | ICD-10-CM

## 2024-09-02 ENCOUNTER — Telehealth: Payer: Self-pay | Admitting: Oncology
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
## 2024-09-05 ENCOUNTER — Inpatient Hospital Stay: Admitting: Oncology

## 2024-09-05 ENCOUNTER — Inpatient Hospital Stay

## 2024-09-11 ENCOUNTER — Telehealth: Payer: Self-pay

## 2024-09-11 NOTE — Telephone Encounter (Signed)
 Monday 09/12/24 appointments canceled. Patient is aware and scheduling will call to reschedule.

## 2024-09-12 ENCOUNTER — Inpatient Hospital Stay

## 2024-09-12 ENCOUNTER — Inpatient Hospital Stay: Admitting: Oncology

## 2024-12-27 ENCOUNTER — Ambulatory Visit (HOSPITAL_BASED_OUTPATIENT_CLINIC_OR_DEPARTMENT_OTHER)
